# Patient Record
Sex: Female | Born: 1950
Health system: Southern US, Community
[De-identification: ages and names within clinical notes are randomized; demographics above are authoritative.]

## PROBLEM LIST (undated history)

## (undated) DIAGNOSIS — C50919 Malignant neoplasm of unspecified site of unspecified female breast: Secondary | ICD-10-CM

## (undated) DIAGNOSIS — F32A Depression, unspecified: Secondary | ICD-10-CM

## (undated) DIAGNOSIS — I10 Essential (primary) hypertension: Secondary | ICD-10-CM

## (undated) DIAGNOSIS — R001 Bradycardia, unspecified: Secondary | ICD-10-CM

## (undated) DIAGNOSIS — E785 Hyperlipidemia, unspecified: Secondary | ICD-10-CM

## (undated) DIAGNOSIS — J302 Other seasonal allergic rhinitis: Secondary | ICD-10-CM

## (undated) DIAGNOSIS — T8859XA Other complications of anesthesia, initial encounter: Secondary | ICD-10-CM

## (undated) DIAGNOSIS — M1990 Unspecified osteoarthritis, unspecified site: Secondary | ICD-10-CM

## (undated) DIAGNOSIS — R112 Nausea with vomiting, unspecified: Secondary | ICD-10-CM

## (undated) DIAGNOSIS — T4145XA Adverse effect of unspecified anesthetic, initial encounter: Secondary | ICD-10-CM

## (undated) DIAGNOSIS — R94130 Abnormal response to nerve stimulation, unspecified: Secondary | ICD-10-CM

## (undated) DIAGNOSIS — G4730 Sleep apnea, unspecified: Secondary | ICD-10-CM

## (undated) DIAGNOSIS — E039 Hypothyroidism, unspecified: Secondary | ICD-10-CM

## (undated) DIAGNOSIS — Z9889 Other specified postprocedural states: Secondary | ICD-10-CM

## (undated) DIAGNOSIS — F329 Major depressive disorder, single episode, unspecified: Secondary | ICD-10-CM

## (undated) DIAGNOSIS — E049 Nontoxic goiter, unspecified: Secondary | ICD-10-CM

## (undated) HISTORY — DX: Major depressive disorder, single episode, unspecified: F32.9

## (undated) HISTORY — DX: Hyperlipidemia, unspecified: E78.5

## (undated) HISTORY — DX: Unspecified osteoarthritis, unspecified site: M19.90

## (undated) HISTORY — DX: Hypothyroidism, unspecified: E03.9

## (undated) HISTORY — DX: Abnormal response to nerve stimulation, unspecified: R94.130

## (undated) HISTORY — DX: Morbid (severe) obesity due to excess calories: E66.01

## (undated) HISTORY — DX: Nontoxic goiter, unspecified: E04.9

## (undated) HISTORY — DX: Sleep apnea, unspecified: G47.30

## (undated) HISTORY — DX: Malignant neoplasm of unspecified site of unspecified female breast: C50.919

## (undated) HISTORY — DX: Essential (primary) hypertension: I10

## (undated) HISTORY — PX: APPENDECTOMY: SHX54

## (undated) HISTORY — DX: Depression, unspecified: F32.A

## (undated) HISTORY — PX: COLONOSCOPY: SHX174

---

## 1998-09-28 ENCOUNTER — Encounter: Payer: Self-pay | Admitting: *Deleted

## 1998-09-28 ENCOUNTER — Ambulatory Visit (HOSPITAL_COMMUNITY): Admission: RE | Admit: 1998-09-28 | Discharge: 1998-09-28 | Payer: Self-pay | Admitting: *Deleted

## 1998-10-01 ENCOUNTER — Other Ambulatory Visit: Admission: RE | Admit: 1998-10-01 | Discharge: 1998-10-01 | Payer: Self-pay | Admitting: *Deleted

## 1999-12-21 ENCOUNTER — Encounter: Payer: Self-pay | Admitting: *Deleted

## 1999-12-21 ENCOUNTER — Ambulatory Visit (HOSPITAL_COMMUNITY): Admission: RE | Admit: 1999-12-21 | Discharge: 1999-12-21 | Payer: Self-pay | Admitting: *Deleted

## 2001-03-26 ENCOUNTER — Ambulatory Visit (HOSPITAL_BASED_OUTPATIENT_CLINIC_OR_DEPARTMENT_OTHER): Admission: RE | Admit: 2001-03-26 | Discharge: 2001-03-26 | Payer: Self-pay | Admitting: Family Medicine

## 2001-03-26 ENCOUNTER — Encounter: Payer: Self-pay | Admitting: Pulmonary Disease

## 2001-08-02 ENCOUNTER — Ambulatory Visit (HOSPITAL_BASED_OUTPATIENT_CLINIC_OR_DEPARTMENT_OTHER): Admission: RE | Admit: 2001-08-02 | Discharge: 2001-08-02 | Payer: Self-pay | Admitting: Pulmonary Disease

## 2001-08-02 ENCOUNTER — Encounter: Payer: Self-pay | Admitting: Pulmonary Disease

## 2002-09-03 ENCOUNTER — Encounter: Admission: RE | Admit: 2002-09-03 | Discharge: 2002-12-02 | Payer: Self-pay | Admitting: Internal Medicine

## 2003-12-23 ENCOUNTER — Emergency Department (HOSPITAL_COMMUNITY): Admission: EM | Admit: 2003-12-23 | Discharge: 2003-12-23 | Payer: Self-pay | Admitting: Emergency Medicine

## 2004-07-13 ENCOUNTER — Ambulatory Visit (HOSPITAL_COMMUNITY): Admission: RE | Admit: 2004-07-13 | Discharge: 2004-07-13 | Payer: Self-pay | Admitting: Orthopedic Surgery

## 2004-07-20 ENCOUNTER — Ambulatory Visit: Payer: Self-pay | Admitting: Internal Medicine

## 2004-07-22 ENCOUNTER — Ambulatory Visit: Payer: Self-pay | Admitting: Internal Medicine

## 2004-09-02 ENCOUNTER — Ambulatory Visit (HOSPITAL_COMMUNITY): Admission: RE | Admit: 2004-09-02 | Discharge: 2004-09-02 | Payer: Self-pay | Admitting: Orthopedic Surgery

## 2004-11-01 ENCOUNTER — Ambulatory Visit: Payer: Self-pay | Admitting: Internal Medicine

## 2005-02-07 ENCOUNTER — Ambulatory Visit: Payer: Self-pay | Admitting: Internal Medicine

## 2005-02-27 HISTORY — PX: KNEE ARTHROSCOPY W/ MENISCECTOMY: SHX1879

## 2005-08-11 ENCOUNTER — Ambulatory Visit: Payer: Self-pay | Admitting: Internal Medicine

## 2006-03-08 ENCOUNTER — Ambulatory Visit: Payer: Self-pay | Admitting: Internal Medicine

## 2006-03-27 ENCOUNTER — Ambulatory Visit: Payer: Self-pay | Admitting: Internal Medicine

## 2006-06-12 ENCOUNTER — Ambulatory Visit: Payer: Self-pay | Admitting: Internal Medicine

## 2006-06-27 ENCOUNTER — Ambulatory Visit: Payer: Self-pay | Admitting: Internal Medicine

## 2006-06-27 LAB — CONVERTED CEMR LAB
BUN: 16 mg/dL (ref 6–23)
CO2: 26 meq/L (ref 19–32)
Calcium: 9.4 mg/dL (ref 8.4–10.5)
Chloride: 106 meq/L (ref 96–112)
Creatinine, Ser: 0.9 mg/dL (ref 0.4–1.2)

## 2006-12-13 ENCOUNTER — Encounter: Payer: Self-pay | Admitting: Internal Medicine

## 2006-12-24 ENCOUNTER — Ambulatory Visit: Payer: Self-pay | Admitting: Pulmonary Disease

## 2007-02-12 ENCOUNTER — Encounter: Payer: Self-pay | Admitting: Pulmonary Disease

## 2007-02-12 DIAGNOSIS — E039 Hypothyroidism, unspecified: Secondary | ICD-10-CM | POA: Insufficient documentation

## 2007-02-12 DIAGNOSIS — I10 Essential (primary) hypertension: Secondary | ICD-10-CM | POA: Insufficient documentation

## 2007-02-12 DIAGNOSIS — E7800 Pure hypercholesterolemia, unspecified: Secondary | ICD-10-CM | POA: Insufficient documentation

## 2007-02-12 DIAGNOSIS — G4730 Sleep apnea, unspecified: Secondary | ICD-10-CM | POA: Insufficient documentation

## 2007-02-14 ENCOUNTER — Telehealth (INDEPENDENT_AMBULATORY_CARE_PROVIDER_SITE_OTHER): Payer: Self-pay | Admitting: *Deleted

## 2007-04-02 ENCOUNTER — Telehealth (INDEPENDENT_AMBULATORY_CARE_PROVIDER_SITE_OTHER): Payer: Self-pay | Admitting: *Deleted

## 2007-04-24 ENCOUNTER — Ambulatory Visit: Payer: Self-pay | Admitting: Internal Medicine

## 2007-04-24 DIAGNOSIS — M1990 Unspecified osteoarthritis, unspecified site: Secondary | ICD-10-CM | POA: Insufficient documentation

## 2007-04-26 ENCOUNTER — Telehealth (INDEPENDENT_AMBULATORY_CARE_PROVIDER_SITE_OTHER): Payer: Self-pay | Admitting: *Deleted

## 2007-04-26 ENCOUNTER — Encounter (INDEPENDENT_AMBULATORY_CARE_PROVIDER_SITE_OTHER): Payer: Self-pay | Admitting: *Deleted

## 2007-08-05 ENCOUNTER — Inpatient Hospital Stay (HOSPITAL_COMMUNITY): Admission: RE | Admit: 2007-08-05 | Discharge: 2007-08-08 | Payer: Self-pay | Admitting: Orthopedic Surgery

## 2007-08-05 ENCOUNTER — Ambulatory Visit: Payer: Self-pay | Admitting: Cardiology

## 2007-08-05 HISTORY — PX: TOTAL KNEE ARTHROPLASTY: SHX125

## 2007-08-07 ENCOUNTER — Encounter: Payer: Self-pay | Admitting: Pulmonary Disease

## 2007-10-01 ENCOUNTER — Ambulatory Visit (HOSPITAL_COMMUNITY): Admission: RE | Admit: 2007-10-01 | Discharge: 2007-10-02 | Payer: Self-pay | Admitting: Orthopedic Surgery

## 2007-10-14 ENCOUNTER — Telehealth (INDEPENDENT_AMBULATORY_CARE_PROVIDER_SITE_OTHER): Payer: Self-pay | Admitting: *Deleted

## 2007-10-24 ENCOUNTER — Ambulatory Visit: Payer: Self-pay | Admitting: Internal Medicine

## 2007-10-24 DIAGNOSIS — F32A Depression, unspecified: Secondary | ICD-10-CM | POA: Insufficient documentation

## 2007-10-24 DIAGNOSIS — F329 Major depressive disorder, single episode, unspecified: Secondary | ICD-10-CM

## 2007-10-24 DIAGNOSIS — F419 Anxiety disorder, unspecified: Secondary | ICD-10-CM

## 2007-10-28 LAB — CONVERTED CEMR LAB: TSH: 3.16 microintl units/mL (ref 0.35–5.50)

## 2007-11-20 ENCOUNTER — Ambulatory Visit: Payer: Self-pay | Admitting: Pulmonary Disease

## 2007-11-25 ENCOUNTER — Ambulatory Visit: Payer: Self-pay | Admitting: Internal Medicine

## 2008-10-29 ENCOUNTER — Encounter (INDEPENDENT_AMBULATORY_CARE_PROVIDER_SITE_OTHER): Payer: Self-pay | Admitting: *Deleted

## 2008-11-19 ENCOUNTER — Ambulatory Visit: Payer: Self-pay | Admitting: Pulmonary Disease

## 2008-11-19 DIAGNOSIS — G4733 Obstructive sleep apnea (adult) (pediatric): Secondary | ICD-10-CM | POA: Insufficient documentation

## 2008-12-21 ENCOUNTER — Ambulatory Visit: Payer: Self-pay | Admitting: Internal Medicine

## 2008-12-21 LAB — CONVERTED CEMR LAB
Hgb A1c MFr Bld: 12.6 % — ABNORMAL HIGH (ref 4.6–6.5)
Vit D, 25-Hydroxy: 17 ng/mL — ABNORMAL LOW (ref 30–89)

## 2008-12-30 ENCOUNTER — Ambulatory Visit: Payer: Self-pay | Admitting: Internal Medicine

## 2008-12-31 LAB — CONVERTED CEMR LAB
ALT: 20 units/L (ref 0–35)
AST: 25 units/L (ref 0–37)
Basophils Absolute: 0.1 10*3/uL (ref 0.0–0.1)
Basophils Relative: 1.9 % (ref 0.0–3.0)
Calcium: 9.1 mg/dL (ref 8.4–10.5)
Direct LDL: 110.9 mg/dL
Eosinophils Relative: 2.5 % (ref 0.0–5.0)
GFR calc non Af Amer: 77.9 mL/min (ref >60)
Glucose, Bld: 188 mg/dL — ABNORMAL HIGH (ref 70–99)
HCT: 44.4 % (ref 36.0–46.0)
HDL: 42.8 mg/dL (ref >39.00)
Hemoglobin: 14.9 g/dL (ref 12.0–15.0)
Lymphocytes Relative: 26.9 % (ref 12.0–46.0)
Lymphs Abs: 1.8 10*3/uL (ref 0.7–4.0)
Monocytes Relative: 5.9 % (ref 3.0–12.0)
Neutro Abs: 4.1 10*3/uL (ref 1.4–7.7)
Potassium: 3.8 meq/L (ref 3.5–5.1)
RBC: 5.08 M/uL (ref 3.87–5.11)
RDW: 14.1 % (ref 11.5–14.6)
Sodium: 135 meq/L (ref 135–145)
Total CHOL/HDL Ratio: 4
VLDL: 41.2 mg/dL — ABNORMAL HIGH (ref 0.0–40.0)
WBC: 6.6 10*3/uL (ref 4.5–10.5)

## 2009-01-01 ENCOUNTER — Ambulatory Visit: Payer: Self-pay | Admitting: Internal Medicine

## 2009-01-01 DIAGNOSIS — E1169 Type 2 diabetes mellitus with other specified complication: Secondary | ICD-10-CM | POA: Insufficient documentation

## 2009-01-01 DIAGNOSIS — E559 Vitamin D deficiency, unspecified: Secondary | ICD-10-CM | POA: Insufficient documentation

## 2009-01-01 DIAGNOSIS — E669 Obesity, unspecified: Secondary | ICD-10-CM | POA: Insufficient documentation

## 2009-01-15 ENCOUNTER — Telehealth (INDEPENDENT_AMBULATORY_CARE_PROVIDER_SITE_OTHER): Payer: Self-pay | Admitting: *Deleted

## 2009-01-18 ENCOUNTER — Encounter: Admission: RE | Admit: 2009-01-18 | Discharge: 2009-01-18 | Payer: Self-pay | Admitting: Internal Medicine

## 2009-01-25 ENCOUNTER — Encounter: Payer: Self-pay | Admitting: Internal Medicine

## 2009-01-25 ENCOUNTER — Ambulatory Visit: Payer: Self-pay | Admitting: Internal Medicine

## 2009-01-27 ENCOUNTER — Encounter: Payer: Self-pay | Admitting: Internal Medicine

## 2009-01-28 ENCOUNTER — Telehealth (INDEPENDENT_AMBULATORY_CARE_PROVIDER_SITE_OTHER): Payer: Self-pay | Admitting: *Deleted

## 2009-01-29 ENCOUNTER — Encounter: Admission: RE | Admit: 2009-01-29 | Discharge: 2009-01-29 | Payer: Self-pay | Admitting: Internal Medicine

## 2009-02-04 ENCOUNTER — Telehealth: Payer: Self-pay | Admitting: Internal Medicine

## 2009-02-22 ENCOUNTER — Encounter (INDEPENDENT_AMBULATORY_CARE_PROVIDER_SITE_OTHER): Payer: Self-pay | Admitting: *Deleted

## 2009-02-24 ENCOUNTER — Telehealth (INDEPENDENT_AMBULATORY_CARE_PROVIDER_SITE_OTHER): Payer: Self-pay | Admitting: *Deleted

## 2009-03-16 ENCOUNTER — Ambulatory Visit: Payer: Self-pay | Admitting: Internal Medicine

## 2009-03-18 ENCOUNTER — Encounter (INDEPENDENT_AMBULATORY_CARE_PROVIDER_SITE_OTHER): Payer: Self-pay | Admitting: *Deleted

## 2009-03-18 LAB — CONVERTED CEMR LAB: ALT: 18 units/L (ref 0–35)

## 2009-04-07 ENCOUNTER — Telehealth: Payer: Self-pay | Admitting: Internal Medicine

## 2009-04-23 ENCOUNTER — Telehealth (INDEPENDENT_AMBULATORY_CARE_PROVIDER_SITE_OTHER): Payer: Self-pay | Admitting: *Deleted

## 2009-06-07 ENCOUNTER — Telehealth (INDEPENDENT_AMBULATORY_CARE_PROVIDER_SITE_OTHER): Payer: Self-pay | Admitting: *Deleted

## 2009-06-08 ENCOUNTER — Ambulatory Visit: Payer: Self-pay | Admitting: Internal Medicine

## 2009-06-10 LAB — CONVERTED CEMR LAB
CO2: 30 meq/L (ref 19–32)
Chloride: 104 meq/L (ref 96–112)
Creatinine, Ser: 0.9 mg/dL (ref 0.4–1.2)
Hgb A1c MFr Bld: 8.1 % — ABNORMAL HIGH (ref 4.6–6.5)
Potassium: 4.7 meq/L (ref 3.5–5.1)
Sodium: 143 meq/L (ref 135–145)
TSH: 2.15 microintl units/mL (ref 0.35–5.50)

## 2009-06-17 ENCOUNTER — Telehealth (INDEPENDENT_AMBULATORY_CARE_PROVIDER_SITE_OTHER): Payer: Self-pay | Admitting: *Deleted

## 2009-08-02 ENCOUNTER — Telehealth (INDEPENDENT_AMBULATORY_CARE_PROVIDER_SITE_OTHER): Payer: Self-pay | Admitting: *Deleted

## 2009-09-08 ENCOUNTER — Ambulatory Visit: Payer: Self-pay | Admitting: Internal Medicine

## 2009-09-10 LAB — CONVERTED CEMR LAB
ALT: 15 units/L (ref 0–35)
AST: 20 units/L (ref 0–37)
Hgb A1c MFr Bld: 6.4 % (ref 4.6–6.5)
TSH: 0.71 microintl units/mL (ref 0.35–5.50)

## 2009-11-19 ENCOUNTER — Ambulatory Visit: Payer: Self-pay | Admitting: Pulmonary Disease

## 2009-12-06 ENCOUNTER — Encounter: Payer: Self-pay | Admitting: Internal Medicine

## 2009-12-06 ENCOUNTER — Ambulatory Visit: Payer: Self-pay | Admitting: Internal Medicine

## 2009-12-06 LAB — CONVERTED CEMR LAB: Vit D, 25-Hydroxy: 44 ng/mL (ref 30–89)

## 2009-12-08 LAB — CONVERTED CEMR LAB
Calcium: 9.2 mg/dL (ref 8.4–10.5)
Cholesterol: 167 mg/dL (ref 0–200)
Creatinine, Ser: 1 mg/dL (ref 0.4–1.2)
GFR calc non Af Amer: 60.02 mL/min (ref >60)
Sodium: 139 meq/L (ref 135–145)
Triglycerides: 120 mg/dL (ref 0.0–149.0)

## 2009-12-25 ENCOUNTER — Encounter: Payer: Self-pay | Admitting: Pulmonary Disease

## 2010-02-01 ENCOUNTER — Telehealth: Payer: Self-pay | Admitting: Internal Medicine

## 2010-02-11 ENCOUNTER — Ambulatory Visit: Payer: Self-pay | Admitting: Internal Medicine

## 2010-02-17 ENCOUNTER — Telehealth: Payer: Self-pay | Admitting: Internal Medicine

## 2010-02-23 ENCOUNTER — Ambulatory Visit
Admission: RE | Admit: 2010-02-23 | Discharge: 2010-02-23 | Payer: Self-pay | Source: Home / Self Care | Attending: Licensed Clinical Social Worker | Admitting: Licensed Clinical Social Worker

## 2010-03-08 ENCOUNTER — Ambulatory Visit: Admit: 2010-03-08 | Payer: Self-pay | Admitting: Licensed Clinical Social Worker

## 2010-03-16 ENCOUNTER — Ambulatory Visit
Admission: RE | Admit: 2010-03-16 | Discharge: 2010-03-16 | Payer: Self-pay | Source: Home / Self Care | Attending: Internal Medicine | Admitting: Internal Medicine

## 2010-03-20 ENCOUNTER — Encounter: Payer: Self-pay | Admitting: Internal Medicine

## 2010-03-22 ENCOUNTER — Ambulatory Visit: Admit: 2010-03-22 | Payer: Self-pay | Admitting: Licensed Clinical Social Worker

## 2010-03-27 LAB — CONVERTED CEMR LAB
ALT: 18 units/L (ref 0–35)
Basophils Absolute: 0 10*3/uL (ref 0.0–0.1)
Bilirubin Urine: NEGATIVE
Blood in Urine, dipstick: NEGATIVE
Chloride: 109 meq/L (ref 96–112)
Cholesterol: 156 mg/dL (ref 0–200)
Eosinophils Absolute: 0.2 10*3/uL (ref 0.0–0.6)
GFR calc Af Amer: 83 mL/min
GFR calc non Af Amer: 69 mL/min
Glucose, Bld: 107 mg/dL — ABNORMAL HIGH (ref 70–99)
Glucose, Urine, Semiquant: NEGATIVE
HCT: 42.9 % (ref 36.0–46.0)
Ketones, urine, test strip: NEGATIVE
Lymphocytes Relative: 22.3 % (ref 12.0–46.0)
MCHC: 32.7 g/dL (ref 30.0–36.0)
MCV: 86.8 fL (ref 78.0–100.0)
Neutro Abs: 4.4 10*3/uL (ref 1.4–7.7)
Neutrophils Relative %: 65.2 % (ref 43.0–77.0)
Platelets: 277 10*3/uL (ref 150–400)
Protein, U semiquant: NEGATIVE
RBC: 4.94 M/uL (ref 3.87–5.11)
Sodium: 141 meq/L (ref 135–145)
Specific Gravity, Urine: 1.01
Total CHOL/HDL Ratio: 3.5
Triglycerides: 96 mg/dL (ref 0–149)
WBC, UA: NONE SEEN cells/hpf (ref <3)
pH: 7.5

## 2010-03-31 NOTE — Assessment & Plan Note (Signed)
Summary: rov for osa   Visit Type:  Follow-up  CC:  1 year follow up. pt states she uses cpap 7/7 nights x 6-8 hrs. Pt states she needs a new mask due to wear and tear. Pt states she is not having any problems with machine or pressure. Marland Kitchen  History of Present Illness: the pt comes in today for f/u of her known osa.  She has been wearing cpap compliantly, and denies any issues with her mask fit or pressure.  She is in need of a new mask and supplies.  She is sleeping well at night, and denies any issues with daytime alertness.  Her weight has gone up 27 pounds since the last visit.  Current Medications (verified): 1)  Crestor 10 Mg Tabs (Rosuvastatin Calcium) .Marland Kitchen.. 1 By Mouth Qd 2)  Metoprolol Tartrate 50 Mg Tabs (Metoprolol Tartrate) .... Take 1 Tablet By Mouth Two Times A Day 3)  Benazepril Hcl 20 Mg  Tabs (Benazepril Hcl) .... Take One Tablet Daily 4)  Hydrochlorothiazide 25 Mg  Tabs (Hydrochlorothiazide) .... Take One Tablet Daily 5)  Synthroid 150 Mcg Tabs (Levothyroxine Sodium) .Marland Kitchen.. 1 By Mouth Once Daily - Recheck Tsh in 2 Months 6)  Sertraline Hcl 50 Mg Tabs (Sertraline Hcl) .... Take 2  Tabs By Mouth Daily 7)  One Touch Ultra Test Strips .... Check Bs Daily;  Dx 250.00 8)  Glimepiride 4 Mg Tabs (Glimepiride) .Marland Kitchen.. 1 By Mouth Qam, 1/2 By Mouth With Dinner 9)  Metformin Hcl 500 Mg Tabs (Metformin Hcl) .Marland Kitchen.. 1 By Mouth Two Times A Day 10)  Actos 30 Mg Tabs (Pioglitazone Hcl) .Marland Kitchen.. 1 By Mouth Once Daily 11)  One Touch Ultra Lancets .... Use As Directed Daily. 12)  Onetouch Ultrasoft Lancets  Misc (Lancets) .... As Directed  Allergies (verified): No Known Drug Allergies  Review of Systems       The patient complains of shortness of breath with activity, non-productive cough, and hand/feet swelling.  The patient denies shortness of breath at rest, productive cough, coughing up blood, chest pain, irregular heartbeats, acid heartburn, indigestion, loss of appetite, weight change, abdominal  pain, difficulty swallowing, sore throat, tooth/dental problems, headaches, nasal congestion/difficulty breathing through nose, sneezing, itching, ear ache, anxiety, depression, joint stiffness or pain, rash, change in color of mucus, and fever.    Vital Signs:  Patient profile:   60 year old female Height:      64.5 inches Weight:      294.50 pounds BMI:     49.95 O2 Sat:      95 % on Room air Temp:     98.3 degrees F oral Pulse rate:   70 / minute BP sitting:   140 / 72  (left arm) Cuff size:   large  Vitals Entered By: Carver Fila (November 19, 2009 3:05 PM)  O2 Flow:  Room air CC: 1 year follow up. pt states she uses cpap 7/7 nights x 6-8 hrs. Pt states she needs a new mask due to wear and tear. Pt states she is not having any problems with machine or pressure.  Comments meds and allergies updated Phone number updated Carver Fila  November 19, 2009 3:06 PM    Physical Exam  General:  obese female in nad Nose:  no skin breakdown or pressure necrosis from cpap mask Extremities:  1+ edema bilat, no cyanosis  Neurologic:  alert and oriented, moves all 4. does not appear sleepy   Impression & Recommendations:  Problem #  1:  OBSTRUCTIVE SLEEP APNEA (ICD-327.23) the pt is wearing cpap compliantly, and feels that it is continuing to help her.  She is in need of new supplies and mask, and with her weight increase of 27 pounds, will need to have her pressure re-optimized.  Will set up on auto mode for 2 weeks, and will get download and call her with the results.  I have urged her to work aggressively on weight loss.  If she is doing well after pressure adjustment, will see in followup in one year.  Other Orders: Est. Patient Level III (28413) DME Referral (DME)  Patient Instructions: 1)  will get you new mask and supplies 2)  will have your pressure re-optimized with auto device for 2 weeks.Marland KitchenMarland KitchenI will call you when download is available. 3)  work on weight loss 4)  followup  with me in one year.    Appended Document: rov for osa we have received pt cpap download and put into your very important look at folder

## 2010-03-31 NOTE — Assessment & Plan Note (Signed)
Summary: follow up/cbs   Vital Signs:  Patient profile:   60 year old female Weight:      292 pounds Pulse rate:   60 / minute Pulse rhythm:   regular BP sitting:   136 / 80  (left arm) Cuff size:   large  Vitals Entered By: Army Fossa CMA (February 11, 2010 2:45 PM) CC: follow up visit. Comments discuss referral for counseling  meds helped some  [facility] [address]    History of Present Illness: patient lost her only daughter a few days ago. Obviously upset about the situation  Current Medications (verified): 1)  Crestor 10 Mg Tabs (Rosuvastatin Calcium) .Marland Kitchen.. 1 By Mouth Qd 2)  Metoprolol Tartrate 50 Mg Tabs (Metoprolol Tartrate) .... Take 1 Tablet By Mouth Two Times A Day 3)  Benazepril Hcl 20 Mg  Tabs (Benazepril Hcl) .... Take One Tablet Daily 4)  Hydrochlorothiazide 25 Mg  Tabs (Hydrochlorothiazide) .... Take One Tablet Daily 5)  Synthroid 150 Mcg Tabs (Levothyroxine Sodium) .Marland Kitchen.. 1 By Mouth Once Daily - Recheck Tsh in 2 Months 6)  Sertraline Hcl 50 Mg Tabs (Sertraline Hcl) .... Take 2  Tabs By Mouth Daily 7)  Glimepiride 4 Mg Tabs (Glimepiride) .Marland Kitchen.. 1 By Mouth Qam, 1/2 By Mouth With Dinner 8)  Metformin Hcl 500 Mg Tabs (Metformin Hcl) .Marland Kitchen.. 1 By Mouth Two Times A Day 9)  Actos 30 Mg Tabs (Pioglitazone Hcl) .Marland Kitchen.. 1 By Mouth Once Daily 10)  Ca and Vit D 11)  One Touch Ultra Test Strips .... Check Bs Daily;  Dx 250.00 12)  One Touch Ultra Lancets .... Use As Directed Daily. 13)  Onetouch Ultrasoft Lancets  Misc (Lancets) .... As Directed 14)  Aspirin 81 Mg Tbec (Aspirin) .Marland Kitchen.. 1 A Day 15)  Alprazolam 0.5 Mg Tabs (Alprazolam) .... One P.o. T.i.d. P.r.n. Severe Anxiety or Difficulty Sleeping.  Allergies (verified): No Known Drug Allergies  Past History:  Past Medical History: Reviewed history from 09/08/2009 and no changes required. DM HYPOTHYROIDISM w/ h/o goiter MORBID OBESITY   SLEEP APNEA, on CPAP DYSLIPIDEMIA  HYPERTENSION   OSTEOARTHRITIS Depression  Past  Surgical History: Reviewed history from 10/24/2007 and no changes required. Appendectomy Multiple orthpedic procedures R TKR  08-05-07  Review of Systems       no suicidal thoughts Has used Xanax as needed with good results  Physical Exam  General:  alert and well-developed.   Psych:  tearful but coherent and cooperative    Impression & Recommendations:  Problem # 1:  DEPRESSION (ICD-311)  acute worsening of her symptoms due to the loss of her daughter I counseled her the best i could She is not suicidal at this point Will increase sertraline from 100 mg to 150 mg, encouraged to use the Xanax as needed Reassess in 6 weeks Referred to a counselor as soon as possible Her updated medication list for this problem includes:    Sertraline Hcl 50 Mg Tabs (Sertraline hcl) .Marland Kitchen... Take 3  tabs by mouth daily    Alprazolam 0.5 Mg Tabs (Alprazolam) ..... One p.o. t.i.d. p.r.n. severe anxiety or difficulty sleeping.  Orders: Misc. Referral (Misc. Ref)  Complete Medication List: 1)  Crestor 10 Mg Tabs (Rosuvastatin calcium) .Marland Kitchen.. 1 by mouth qd 2)  Metoprolol Tartrate 50 Mg Tabs (Metoprolol tartrate) .... Take 1 tablet by mouth two times a day 3)  Benazepril Hcl 20 Mg Tabs (Benazepril hcl) .... Take one tablet daily 4)  Hydrochlorothiazide 25 Mg Tabs (Hydrochlorothiazide) .... Take one  tablet daily 5)  Synthroid 150 Mcg Tabs (Levothyroxine sodium) .Marland Kitchen.. 1 by mouth once daily - recheck tsh in 2 months 6)  Sertraline Hcl 50 Mg Tabs (Sertraline hcl) .... Take 3  tabs by mouth daily 7)  Glimepiride 4 Mg Tabs (Glimepiride) .Marland Kitchen.. 1 by mouth qam, 1/2 by mouth with dinner 8)  Metformin Hcl 500 Mg Tabs (Metformin hcl) .Marland Kitchen.. 1 by mouth two times a day 9)  Actos 30 Mg Tabs (Pioglitazone hcl) .Marland Kitchen.. 1 by mouth once daily 10)  Ca and Vit D  11)  One Touch Ultra Test Strips  .... Check bs daily;  dx 250.00 12)  One Touch Ultra Lancets  .... Use as directed daily. 13)  Onetouch Ultrasoft Lancets Misc  (Lancets) .... As directed 14)  Aspirin 81 Mg Tbec (Aspirin) .Marland Kitchen.. 1 a day 15)  Alprazolam 0.5 Mg Tabs (Alprazolam) .... One p.o. t.i.d. p.r.n. severe anxiety or difficulty sleeping.  Patient Instructions: 1)  Please schedule a follow-up appointment in 6  weeks.  Prescriptions: ALPRAZOLAM 0.5 MG TABS (ALPRAZOLAM) one p.o. t.i.d. p.r.n. severe anxiety or difficulty sleeping.  #60 x 0   Entered and Authorized by:   Nolon Rod. Paz MD   Signed by:   Nolon Rod. Paz MD on 02/11/2010   Method used:   Print then Give to Patient   RxID:   [id number] SERTRALINE HCL 50 MG TABS (SERTRALINE HCL) take 3  tabs by mouth daily  #90 x 3   Entered and Authorized by:   Nolon Rod. Paz MD   Signed by:   Nolon Rod. Paz MD on 02/11/2010   Method used:   Print then Give to Patient   RxID:   [id number]    Orders Added: 1)  Misc. Referral [Misc. Ref] 2)  Est. Patient Level III [84696]

## 2010-03-31 NOTE — Assessment & Plan Note (Signed)
Summary: 3 MTH FU/KDC   Vital Signs:  Patient profile:   60 year old female Weight:      277.50 pounds Pulse rate:   63 / minute Pulse rhythm:   regular BP sitting:   126 / 84  (left arm) Cuff size:   large  Vitals Entered By: Army Fossa CMA (September 08, 2009 2:12 PM) CC: 3 month follow up Comments - Still emotionally dealing with losing her brother in may.    History of Present Illness: lost  his brother to a massive MI in May 2011, they were very close. obviously  is very affected by this, her mood has up and downs not sleeping very well lately  needs all her prescriptions redone  Allergies (verified): No Known Drug Allergies  Past History:  Past Medical History: DM HYPOTHYROIDISM w/ h/o goiter MORBID OBESITY   SLEEP APNEA, on CPAP DYSLIPIDEMIA  HYPERTENSION   OSTEOARTHRITIS Depression  Past Surgical History: Reviewed history from 10/24/2007 and no changes required. Appendectomy Multiple orthpedic procedures R TKR  08-05-07  Social History: Reviewed history from 12/21/2008 and no changes required. Married, husband on disability since 85 (depression) 1 child Occupation: Data processing manager Former Smoker Alcohol use-yes (occasionally) Drug use-no Regular exercise-yes (couple times a week)  Review of Systems       started actos, fluid retention at baseline ambulatory CBGs good , 110 to 120 in AM, 75 to 90 at PM  good medication compliance w/ BP meds , ambulatory BPs wnl     Psych:  denies suicidal ideation occ.  crying spells.  Physical Exam  General:  alert, well-developed, and overweight-appearing.   Lungs:  normal respiratory effort, no intercostal retractions, no accessory muscle use, and normal breath sounds.   Heart:  normal rate, regular rhythm, no murmur, and no gallop.   Extremities:  trace edema B Psych:  Oriented X3, normally interactive, good eye contact, and not anxious appearing.  appears slightly depressed   Impression &  Recommendations:  Problem # 1:  DEPRESSION (ICD-311) counseled  Just lost her brother We agreed to increase sertraline to 100 mg daily At this point declines counseling mostly due to lack of time. She will keep me posted Her updated medication list for this problem includes:    Sertraline Hcl 50 Mg Tabs (Sertraline hcl) .Marland Kitchen... Take 2  tabs by mouth daily  Problem # 2:  DM (ICD-250.00)  we recently added Actos, labs   Her updated medication list for this problem includes:    Benazepril Hcl 20 Mg Tabs (Benazepril hcl) .Marland Kitchen... Take one tablet daily    Glimepiride 4 Mg Tabs (Glimepiride) .Marland Kitchen... 1 by mouth qam, 1/2 by mouth with dinner    Metformin Hcl 500 Mg Tabs (Metformin hcl) .Marland Kitchen... 1 by mouth two times a day    Actos 30 Mg Tabs (Pioglitazone hcl) .Marland Kitchen... 1 by mouth once daily  Labs Reviewed: Creat: 0.9 (06/08/2009)    Reviewed HgBA1c results: 8.1 (06/08/2009)  7.6 (03/16/2009)  Orders: TLB-A1C / Hgb A1C (Glycohemoglobin) (83036-A1C) TLB-ALT (SGPT) (84460-ALT) TLB-AST (SGOT) (84450-SGOT)  Problem # 3:  HYPOTHYROIDISM (ICD-244.9)  labs Her updated medication list for this problem includes:    Synthroid 150 Mcg Tabs (Levothyroxine sodium) .Marland Kitchen... 1 by mouth once daily - recheck tsh in 2 months    Labs Reviewed: TSH: 2.15 (06/08/2009)    HgBA1c: 8.1 (06/08/2009) Chol: 175 (12/21/2008)   HDL: 42.80 (12/21/2008)   LDL: 92 (04/24/2007)   TG: 206.0 (12/21/2008)  Orders: Venipuncture (02725) TLB-TSH (Thyroid  Stimulating Hormone) (84443-TSH)  Problem # 4:  HYPERTENSION (ICD-401.9) at goal  Her updated medication list for this problem includes:    Metoprolol Tartrate 50 Mg Tabs (Metoprolol tartrate) .Marland Kitchen... Take 1 tablet by mouth two times a day    Benazepril Hcl 20 Mg Tabs (Benazepril hcl) .Marland Kitchen... Take one tablet daily    Hydrochlorothiazide 25 Mg Tabs (Hydrochlorothiazide) .Marland Kitchen... Take one tablet daily  BP today: 126/84 Prior BP: 112/64 (06/08/2009)  Labs Reviewed: K+: 4.7  (06/08/2009) Creat: : 0.9 (06/08/2009)   Chol: 175 (12/21/2008)   HDL: 42.80 (12/21/2008)   LDL: 92 (04/24/2007)   TG: 206.0 (12/21/2008)  Complete Medication List: 1)  Crestor 10 Mg Tabs (Rosuvastatin calcium) .Marland Kitchen.. 1 by mouth qd 2)  Metoprolol Tartrate 50 Mg Tabs (Metoprolol tartrate) .... Take 1 tablet by mouth two times a day 3)  Benazepril Hcl 20 Mg Tabs (Benazepril hcl) .... Take one tablet daily 4)  Hydrochlorothiazide 25 Mg Tabs (Hydrochlorothiazide) .... Take one tablet daily 5)  Synthroid 150 Mcg Tabs (Levothyroxine sodium) .Marland Kitchen.. 1 by mouth once daily - recheck tsh in 2 months 6)  Sertraline Hcl 50 Mg Tabs (Sertraline hcl) .... Take 2  tabs by mouth daily 7)  One Touch Ultra Test Strips  .... Check bs daily;  dx 250.00 8)  Glimepiride 4 Mg Tabs (Glimepiride) .Marland Kitchen.. 1 by mouth qam, 1/2 by mouth with dinner 9)  Metformin Hcl 500 Mg Tabs (Metformin hcl) .Marland Kitchen.. 1 by mouth two times a day 10)  Actos 30 Mg Tabs (Pioglitazone hcl) .Marland Kitchen.. 1 by mouth once daily 11)  One Touch Ultra Lancets  .... Use as directed daily. 12)  Onetouch Ultrasoft Lancets Misc (Lancets) .... As directed  Patient Instructions: 1)  Please schedule a follow-up appointment in 3 to 4 months , fasting for a physical exam Prescriptions: ACTOS 30 MG TABS (PIOGLITAZONE HCL) 1 by mouth once daily  #90 x 3   Entered and Authorized by:   Elita Quick E. Gladys Gutman MD   Signed by:   Nolon Rod. Dionicio Shelnutt MD on 09/08/2009   Method used:   [facility]        [facility]  [address] [id number]       [address]       [place], [place]  [address]       Ph: [phone] or [phone]       Fax: [phone]   RxID:   [id number] METFORMIN HCL 500 MG TABS (METFORMIN HCL) 1 by mouth two times a day  #180 x 3   Entered and Authorized by:   Nolon Rod. Lewellyn Fultz MD   Signed by:   Nolon Rod. Lashun Mccants MD on 09/08/2009   Method used:   [facility]        [facility]  [address] [id number]       [address]       [place], [place]  [address]       Ph: [phone] or  [phone]       Fax: [phone]   RxID:   [id number] GLIMEPIRIDE 4 MG TABS (GLIMEPIRIDE) 1 by mouth qam, 1/2 by mouth with dinner  #135 x 3   Entered and Authorized by:   Nolon Rod. Dustee Bottenfield MD   Signed by:   Nolon Rod. Daniyah Fohl MD on 09/08/2009   Method used:   [facility]        [facility]  [address] [id number]       [address]  [place], [place]  [address]       Ph: [phone] or [phone]       Fax: [phone]   RxID:   [id number] SERTRALINE HCL 50 MG TABS (SERTRALINE HCL) take 2  tabs by mouth daily  #180 x 3   Entered and Authorized by:   Elita Quick E. Lucerito Rosinski MD   Signed by:   Nolon Rod. Aadhya Bustamante MD on 09/08/2009   Method used:   [facility]ron[facility]        [facility]  [address] [id number]       [address]       [place], [place]  [address]       Ph: [phone] or [phone]       Fax: [phone]   RxID:   [id number] SYNTHROID 150 MCG TABS (LEVOTHYROXINE SODIUM) 1 by mouth once daily - RECHECK TSH IN 2 MONTHS  #90 x 3   Entered and Authorized by:   Nolon Rod. Kartier Bennison MD   Signed by:   Nolon Rod. Rossie Bretado MD on 09/08/2009   Method used:   [facility]ron[facility]        [facility]  [address] [id number]       [address]       [place], [place]  [address]       Ph: [phone] or [phone]       Fax: [phone]   RxID:   [id number] HYDROCHLOROTHIAZIDE 25 MG  TABS (HYDROCHLOROTHIAZIDE) take one tablet daily  #90 x 3   Entered and Authorized by:   Nolon Rod. Caoilainn Sacks MD   Signed by:   Nolon Rod. Annalynne Ibanez MD on 09/08/2009   Method used:   [facility]ron[facility]        [facility]  [address] [id number]       [address]       [place], [place]  [address]       Ph: [phone] or [phone]       Fax: [phone]   RxID:   [id number] BENAZEPRIL HCL 20 MG  TABS (BENAZEPRIL HCL) take one tablet daily  #90 x 3   Entered and Authorized by:   Nolon Rod. Ariyon Gerstenberger MD   Signed by:   Nolon Rod. Reg Bircher MD on 09/08/2009   Method used:   [facility]ronically to        [facility]  [address] [id number]       [address]        [place], [place]  [address]       Ph: [phone] or [phone]       Fax: [phone]   RxID:   [id number] METOPROLOL TARTRATE 50 MG TABS (METOPROLOL TARTRATE) Take 1 tablet by mouth two times a day  #180 x 3   Entered and Authorized by:   Nolon Rod. Dezirea Mccollister MD   Signed by:   Nolon Rod. Jowanda Heeg MD on 09/08/2009   Method used:   [facility]ronically to        [facility]  [address] [id number]       [address]       [place], [place]  [address]       Ph: [phone] or [phone]       Fax: [phone]   RxID:   [id number] CRESTOR 10 MG TABS (ROSUVASTATIN CALCIUM) 1 by mouth qd  #90 x 3   Entered and Authorized by:   Nolon Rod. Duke Weisensel MD   Signed by:   Nolon Rod. Saidee Geremia MD on  09/08/2009   Method used:   Electronically to        [facility]  [address] [id number]       [address]       [place], [place]  [address]       Ph: [phone] or [phone]       Fax: [phone]   RxID:   [id number]

## 2010-03-31 NOTE — Progress Notes (Signed)
Summary: Refill Request  Phone Note Refill Request Call back at [phone] Message from:  Pharmacy on August 02, 2009 9:10 AM  Refills Requested: Medication #1:  ACTOS 30 MG TABS 1 by mouth once daily.   Dosage confirmed as above?Dosage Confirmed   [facility] [address] [facility] [place]  Next Appointment Scheduled: 7.13.11 Initial call taken by: Harold Barban,  August 02, 2009 9:10 AM    Prescriptions: ACTOS 30 MG TABS (PIOGLITAZONE HCL) 1 by mouth once daily  #30 x 3   Entered by:   Kandice Hams   Authorized by:   Nolon Rod. Paz MD   Signed by:   Kandice Hams on 08/02/2009   Method used:   F[facility]ed to ...       [facility]  [address] [id number]       [address]       [place], [place]  [address]       Ph: [phone] or [phone]       Fax: [phone]   RxID:   [id number]

## 2010-03-31 NOTE — Progress Notes (Signed)
Summary: blood sugar readings  Phone Note Call from Patient Call back at [phone]   Summary of Call: FASTING BLOOD SUGAR: 153, 138, 179, 171, 178, 160, 143 BLOOD SUGAR BEFORE DINNER 101, 80, 97, 90, 109, 102, 92, 81,86,108, 103  Current Meds:  CRESTOR 10 MG TABS (ROSUVASTATIN CALCIUM) 1 by mouth qd METOPROLOL TARTRATE 50 MG TABS (METOPROLOL TARTRATE) Take 1 tablet by mouth two times a day BENAZEPRIL HCL 20 MG  TABS (BENAZEPRIL HCL) take one tablet daily HYDROCHLOROTHIAZIDE 25 MG  TABS (HYDROCHLOROTHIAZIDE) take one tablet daily SYNTHROID 150 MCG TABS (LEVOTHYROXINE SODIUM) 1 by mouth once daily - RECHECK TSH IN 2 MONTHS SERTRALINE HCL 50 MG TABS (SERTRALINE HCL) take 1 1/2 tabs by mouth daily * ONE TOUCH ULTRA TEST STRIPS & LANCETS check BS daily;  dx 250.00 GLIMEPIRIDE 4 MG TABS (GLIMEPIRIDE) 1 a day METFORMIN HCL 500 MG TABS (METFORMIN HCL) 1 by mouth two times a day   Initial call taken by: Shary Decamp,  April 07, 2009 11:24 AM  Follow-up for Phone Call        increase GLIMEPIRIDE 4 mg from 1 once daily to 1 in AM , 1/2 w/ dinner call w/  CBGs in 3 weeks  call sooner if low CBGs Follow-up by: Zadiel Leyh E. Charlise Giovanetti MD,  April 07, 2009 12:52 PM  Additional Follow-up for Phone Call Additional follow up Details #1::        discussed with pt Additional Follow-up by: Shary Decamp,  April 07, 2009 1:43 PM    New/Updated Medications: GLIMEPIRIDE 4 MG TABS (GLIMEPIRIDE) 1 by mouth qam, 1/2 by mouth with dinner

## 2010-03-31 NOTE — Assessment & Plan Note (Signed)
Summary: 6 WKS OV//PH   Vital Signs:  Patient profile:   60 year old female Weight:      291.50 pounds Pulse rate:   76 / minute Pulse rhythm:   regular BP sitting:   124 / 82  (left arm) Cuff size:   large  Vitals Entered By: Army Fossa CMA (March 16, 2010 3:38 PM) CC: 6 week f/u- not fasting  Comments [facility] [address]   History of Present Illness: followup from his last office visit  Current Medications (verified): 1)  Crestor 10 Mg Tabs (Rosuvastatin Calcium) .Marland Kitchen.. 1 By Mouth Qd 2)  Metoprolol Tartrate 50 Mg Tabs (Metoprolol Tartrate) .... Take 1 Tablet By Mouth Two Times A Day 3)  Benazepril Hcl 20 Mg  Tabs (Benazepril Hcl) .... Take One Tablet Daily 4)  Hydrochlorothiazide 25 Mg  Tabs (Hydrochlorothiazide) .... Take One Tablet Daily 5)  Synthroid 150 Mcg Tabs (Levothyroxine Sodium) .Marland Kitchen.. 1 By Mouth Once Daily - Recheck Tsh in 2 Months 6)  Sertraline Hcl 50 Mg Tabs (Sertraline Hcl) .... Take 3  Tabs By Mouth Daily 7)  Glimepiride 4 Mg Tabs (Glimepiride) .Marland Kitchen.. 1 By Mouth Qam, 1/2 By Mouth With Dinner 8)  Metformin Hcl 500 Mg Tabs (Metformin Hcl) .Marland Kitchen.. 1 By Mouth Two Times A Day 9)  Actos 30 Mg Tabs (Pioglitazone Hcl) .Marland Kitchen.. 1 By Mouth Once Daily 10)  Ca and Vit D 11)  One Touch Ultra Test Strips .... Check Bs Daily;  Dx 250.00 12)  One Touch Ultra Lancets .... Use As Directed Daily. 13)  Onetouch Ultrasoft Lancets  Misc (Lancets) .... As Directed 14)  Aspirin 81 Mg Tbec (Aspirin) .Marland Kitchen.. 1 A Day 15)  Alprazolam 0.5 Mg Tabs (Alprazolam) .... One P.o. T.i.d. P.r.n. Severe Anxiety or Difficulty Sleeping.  Allergies (verified): No Known Drug Allergies  Past History:  Past Medical History: Reviewed history from 09/08/2009 and no changes required. DM HYPOTHYROIDISM w/ h/o goiter MORBID OBESITY   SLEEP APNEA, on CPAP DYSLIPIDEMIA  HYPERTENSION   OSTEOARTHRITIS Depression  Past Surgical History: Reviewed history from 10/24/2007 and no changes  required. Appendectomy Multiple orthpedic procedures R TKR  08-05-07  Social History: Married, husband on disability since 1991 (depression, dementia?) 1 child------ lost child 01-2010 Occupation: logistics Former Smoker Alcohol use-yes (occasionally) Drug use-no    Review of Systems       overall she seems to be doing fair Sleeping better Tolerating higher dose of sertraline Overall less depressed She see a Veterinary surgeon that she likes, Judithe Modest, she also has a good social support with friends and neighbors No suicidal ideas  Physical Exam  General:  alert and well-developed.   Psych:  Oriented X3.  since improved, much better spirits   Impression & Recommendations:  Problem # 1:  DEPRESSION (ICD-311) improving Continue same meds Continue seeing a counselor  Her updated medication list for this problem includes:    Sertraline Hcl 50 Mg Tabs (Sertraline hcl) .Marland Kitchen... Take 3  tabs by mouth daily    Alprazolam 0.5 Mg Tabs (Alprazolam) ..... One p.o. t.i.d. p.r.n. severe anxiety or difficulty sleeping.  Problem # 2:  face-to-face 15 minutes, >50% of time counseling  Complete Medication List: 1)  Crestor 10 Mg Tabs (Rosuvastatin calcium) .Marland Kitchen.. 1 by mouth qd 2)  Metoprolol Tartrate 50 Mg Tabs (Metoprolol tartrate) .... Take 1 tablet by mouth two times a day 3)  Benazepril Hcl 20 Mg Tabs (Benazepril hcl) .... Take one tablet daily 4)  Hydrochlorothiazide 25 Mg  Tabs (Hydrochlorothiazide) .... Take one tablet daily 5)  Synthroid 150 Mcg Tabs (Levothyroxine sodium) .Marland Kitchen.. 1 by mouth once daily - recheck tsh in 2 months 6)  Sertraline Hcl 50 Mg Tabs (Sertraline hcl) .... Take 3  tabs by mouth daily 7)  Glimepiride 4 Mg Tabs (Glimepiride) .Marland Kitchen.. 1 by mouth qam, 1/2 by mouth with dinner 8)  Metformin Hcl 500 Mg Tabs (Metformin hcl) .Marland Kitchen.. 1 by mouth two times a day 9)  Actos 30 Mg Tabs (Pioglitazone hcl) .Marland Kitchen.. 1 by mouth once daily 10)  Ca and Vit D  11)  One Touch Ultra Test Strips  ....  Check bs daily;  dx 250.00 12)  One Touch Ultra Lancets  .... Use as directed daily. 13)  Onetouch Ultrasoft Lancets Misc (Lancets) .... As directed 14)  Aspirin 81 Mg Tbec (Aspirin) .Marland Kitchen.. 1 a day 15)  Alprazolam 0.5 Mg Tabs (Alprazolam) .... One p.o. t.i.d. p.r.n. severe anxiety or difficulty sleeping.  Patient Instructions: 1)  Please schedule a follow-up appointment in 3 months .    Orders Added: 1)  Est. Patient Level III [16109]

## 2010-03-31 NOTE — Progress Notes (Signed)
Summary: blood sugar reading  Phone Note Call from Patient Call back at Home Phone [phone]   Caller: Patient Summary of Call: BLOOD SUGAR READINGS;  fasting BS this am 279, after breakfast 145 fasting BS yesterday 242, last night =90  Initial call taken by: Kandice Hams,  April 23, 2009 9:28 AM  Follow-up for Phone Call        no change for now, call in two weeks with sugar     Follow-up by: [facility] E. Paz MD,  April 23, 2009 1:49 PM  Additional Follow-up for Phone Call Additional follow up Details #1::        pt informed of Dr Drue Novel recommendations ref BS .Kandice Hams  April 23, 2009 2:53 PM  Additional Follow-up by: Kandice Hams,  April 23, 2009 2:53 PM

## 2010-03-31 NOTE — Progress Notes (Signed)
Summary: MISC REFERRAL  I spoke with the pt and she does not have an appt yet. She would like to be called at [phone]. She is aware I will notify [place].  ---- Converted from flag ---- ---- 02/17/2010 3:25 PM, Army Fossa CMA wrote: left message for pt to call back.  ---- 02/12/2010 11:41 AM, Elita Quick E. Kathren Scearce MD wrote: please be sure she saw a counselor ------------------------------  Phone Note Other Incoming

## 2010-03-31 NOTE — Miscellaneous (Signed)
Summary: optimal pressure 13cm.  Clinical Lists Changes  Orders: Added new Referral order of DME Referral (DME) - Signed auto download shows great compliance, no sigificant leak, optimal pressure of 13cm

## 2010-03-31 NOTE — Assessment & Plan Note (Signed)
Summary: roa 6 months,cbs   Vital Signs:  Patient Profile:   60 Years Old Female Height:     64 inches Weight:      270 pounds BSA:     2.22 Pulse rate:   60 / minute BP sitting:   120 / 70  Vitals Entered By: Shary Decamp (October 24, 2007 9:07 AM)                 Chief Complaint:  rov - fasting; problems with depression since surgery (see note from ortho).  History of Present Illness: rov - fasting;  recheck TSH depression: frustrated after TKR b/c recovery is slow and painful "waves of sadness"     Updated Prior Medication List: CRESTOR 10 MG TABS (ROSUVASTATIN CALCIUM) 1 by mouth qd METOPROLOL TARTRATE 50 MG TABS (METOPROLOL TARTRATE) bid BENAZEPRIL HCL 20 MG  TABS (BENAZEPRIL HCL) take one tablet daily HYDROCHLOROTHIAZIDE 25 MG  TABS (HYDROCHLOROTHIAZIDE) take one tablet daily SYNTHROID 125 MCG  TABS (LEVOTHYROXINE SODIUM) 1 by mouth qd HYDROCODONE-ACETAMINOPHEN 5-325 MG TABS (HYDROCODONE-ACETAMINOPHEN) per ortho METHOCARBAMOL 500 MG TABS (METHOCARBAMOL) per ortho  Current Allergies (reviewed today): No known allergies   Past Medical History:    Reviewed history from 04/24/2007 and no changes required:       HYPOTHYROIDISM w/ h/o goiter       MORBID OBESITY (ICD-278.01)       SLEEP APNEA, on CPAP       DYSLIPIDEMIA (ICD-272.4)       HYPERTENSION (ICD-401.9)       OSTEOARTHRITIS       Depression  Past Surgical History:    Appendectomy    Multiple orthpedic procedures    R TKR  08-05-07   Social History:    Married, husband on disability since 31 (depression)    1 child    Occupation: logistics    Review of Systems       no suicidal poor sleep h/o depression in the 1980s and 90s tried meds (name?) things at home are ok   Physical Exam  General:     alert and well-developed.   Psych:     Oriented X3, memory intact for recent and remote, normally interactive, good eye contact, and not anxious appearing.  looks mildly  depress    Impression & Recommendations:  Problem # 1:  DEPRESSION (ICD-311) counseled d/w pt options : meds, counseling or both I think for now will start w/ meds suicidal risk discussed  Her updated medication list for this problem includes:    Sertraline Hcl 50 Mg Tabs (Sertraline hcl) .Marland Kitchen... 1/2 a day x 5 days, 1 a day x 5 days, then 1.5 a day   Problem # 2:  HYPOTHYROIDISM (ICD-244.9)  Her updated medication list for this problem includes:    Synthroid 125 Mcg Tabs (Levothyroxine sodium) .Marland Kitchen... 1 by mouth qd  Orders: Venipuncture (16109) TLB-TSH (Thyroid Stimulating Hormone) (84443-TSH)   Complete Medication List: 1)  Crestor 10 Mg Tabs (Rosuvastatin calcium) .Marland Kitchen.. 1 by mouth qd 2)  Metoprolol Tartrate 50 Mg Tabs (Metoprolol tartrate) .... Bid 3)  Benazepril Hcl 20 Mg Tabs (Benazepril hcl) .... Take one tablet daily 4)  Hydrochlorothiazide 25 Mg Tabs (Hydrochlorothiazide) .... Take one tablet daily 5)  Synthroid 125 Mcg Tabs (Levothyroxine sodium) .Marland Kitchen.. 1 by mouth qd 6)  Hydrocodone-acetaminophen 5-325 Mg Tabs (Hydrocodone-acetaminophen) .... Per ortho 7)  Methocarbamol 500 Mg Tabs (Methocarbamol) .... Per ortho 8)  Sertraline Hcl 50 Mg Tabs (Sertraline hcl) .... 1/2  a day x 5 days, 1 a day x 5 days, then 1.5 a day   Patient Instructions: 1)  Please schedule a follow-up appointment in 4 weeks.   Prescriptions: SERTRALINE HCL 50 MG TABS (SERTRALINE HCL) 1/2 a day x 5 days, 1 a day x 5 days, then 1.5 a day  #60 x 0   Entered and Authorized by:   Elita Quick E. Jai Bear MD   Signed by:   Nolon Rod. Kanye Depree MD on 10/24/2007   Method used:   Print then Give to Patient   RxID:   [id number]  ]

## 2010-03-31 NOTE — Assessment & Plan Note (Signed)
Summary: cpx/fasting//kn   Vital Signs:  Patient profile:   60 year old female Height:      64.5 inches Weight:      287.13 pounds O2 Sat:      94 % on Room air Pulse rate:   71 / minute Pulse rhythm:   regular BP sitting:   126 / 84  (left arm) Cuff size:   large  Vitals Entered By: Army Fossa CMA (December 06, 2009 9:13 AM)  O2 Flow:  Room air CC: CPX, fasting Comments flu shot [facility] due for colonoscopy, pap, and flu shot   History of Present Illness: CPX, fasting    Preventive Screening-Counseling & Management  Caffeine-Diet-Exercise     Does Patient Exercise: no  Current Medications (verified): 1)  Crestor 10 Mg Tabs (Rosuvastatin Calcium) .Marland Kitchen.. 1 By Mouth Qd 2)  Metoprolol Tartrate 50 Mg Tabs (Metoprolol Tartrate) .... Take 1 Tablet By Mouth Two Times A Day 3)  Benazepril Hcl 20 Mg  Tabs (Benazepril Hcl) .... Take One Tablet Daily 4)  Hydrochlorothiazide 25 Mg  Tabs (Hydrochlorothiazide) .... Take One Tablet Daily 5)  Synthroid 150 Mcg Tabs (Levothyroxine Sodium) .Marland Kitchen.. 1 By Mouth Once Daily - Recheck Tsh in 2 Months 6)  Sertraline Hcl 50 Mg Tabs (Sertraline Hcl) .... Take 2  Tabs By Mouth Daily 7)  One Touch Ultra Test Strips .... Check Bs Daily;  Dx 250.00 8)  Glimepiride 4 Mg Tabs (Glimepiride) .Marland Kitchen.. 1 By Mouth Qam, 1/2 By Mouth With Dinner 9)  Metformin Hcl 500 Mg Tabs (Metformin Hcl) .Marland Kitchen.. 1 By Mouth Two Times A Day 10)  Actos 30 Mg Tabs (Pioglitazone Hcl) .Marland Kitchen.. 1 By Mouth Once Daily 11)  One Touch Ultra Lancets .... Use As Directed Daily. 12)  Onetouch Ultrasoft Lancets  Misc (Lancets) .... As Directed  Allergies (verified): No Known Drug Allergies  Past History:  Past Medical History: Reviewed history from 09/08/2009 and no changes required. DM HYPOTHYROIDISM w/ h/o goiter MORBID OBESITY   SLEEP APNEA, on CPAP DYSLIPIDEMIA  HYPERTENSION   OSTEOARTHRITIS Depression  Past Surgical History: Reviewed history from 10/24/2007 and no changes  required. Appendectomy Multiple orthpedic procedures R TKR  08-05-07  Family History: CAD - F (MI age 35), B MI dies at age 41 HTN - F DM - M, sister, brother stroke - no colon Ca - no breast Ca - no lung ca - M stomach ca - bro  Social History: Married, husband on disability since 31 (depression, dementia?) 1 child Occupation: Data processing manager Former Smoker Alcohol use-yes (occasionally) Drug use-no Regular exercise- no, no time  diet-- poor , has several things going on (see ROS) Does Patient Exercise:  no  Review of Systems General:  not eating well, husband ill, has no time for herself lost a brother to CAD  gaining wt  . CV:  Denies chest pain or discomfort, palpitations, and swelling of feet. Resp:  Denies cough, coughing up blood, and shortness of breath. GI:  Denies bloody stools, diarrhea, nausea, and vomiting. GU:  Denies dysuria and hematuria. Psych:  worried about her husband .  Physical Exam  General:  alert, well-developed, and overweight-appearing.    Neck:  no thyromegaly Lungs:  normal respiratory effort, no intercostal retractions, no accessory muscle use, and normal breath sounds.   Heart:  normal rate, regular rhythm, no murmur, and no gallop.   Abdomen:  soft, non-tender, no distention, no masses, no guarding, and no rigidity.  soft, non-tender, no distention, no masses,  no guarding, and no rigidity.   Extremities:  no pitting edema on either side today Psych:  Cognition and judgment appear intact. Alert and cooperative with normal attention span and concentration.  not anxious appearing and not depressed appearing.     Impression & Recommendations:  Problem # 1:  HEALTH SCREENING (ICD-V70.0) Td 2001-- repeat next year flu shot-- today  Pneumonia shot-- today  Cscope 2004: melanosis coli, no polyp due for a colonoscopy?, according with what we know, she is for a repeated cscope in  2014 (Dr Leone Payor)--- will check w/  GI  was referred  to  gyn  last year, she did go. Due for another visit  11- 2010: B mammogram followed by a R  mammogram benign --- next bilateral mammogram 12-2009  DEXA normal, 12/2008 on calcium and vitamin D  Family history of CAD, patient concern. best thing to do is keep under  control all cardiovascular risk factors. Diet and exercise are quite important. counseled . EKG sinus bradycardia, no acute changes Labs   Orders: EKG w/ Interpretation (93000)  Problem # 2:  DM (ICD-250.00) well-controlled, due for labs Her updated medication list for this problem includes:    Benazepril Hcl 20 Mg Tabs (Benazepril hcl) .Marland Kitchen... Take one tablet daily    Glimepiride 4 Mg Tabs (Glimepiride) .Marland Kitchen... 1 by mouth qam, 1/2 by mouth with dinner    Metformin Hcl 500 Mg Tabs (Metformin hcl) .Marland Kitchen... 1 by mouth two times a day    Actos 30 Mg Tabs (Pioglitazone hcl) .Marland Kitchen... 1 by mouth once daily    Aspirin 81 Mg Tbec (Aspirin) .Marland Kitchen... 1 a day    Labs Reviewed: Creat: 0.9 (06/08/2009)    Reviewed HgBA1c results: 6.4 (09/08/2009)  8.1 (06/08/2009)  Her updated medication list for this problem includes:    Benazepril Hcl 20 Mg Tabs (Benazepril hcl) .Marland Kitchen... Take one tablet daily    Glimepiride 4 Mg Tabs (Glimepiride) .Marland Kitchen... 1 by mouth qam, 1/2 by mouth with dinner    Metformin Hcl 500 Mg Tabs (Metformin hcl) .Marland Kitchen... 1 by mouth two times a day    Actos 30 Mg Tabs (Pioglitazone hcl) .Marland Kitchen... 1 by mouth once daily  Orders: TLB-A1C / Hgb A1C (Glycohemoglobin) (83036-A1C)  Problem # 3:  DEPRESSION (ICD-311) counseled , several things going on. Husband apparently developing  dementia, lost a brother to CAD Overall, patient thinks her dose of sertraline is appropriate for now   Her updated medication list for this problem includes:    Sertraline Hcl 50 Mg Tabs (Sertraline hcl) .Marland Kitchen... Take 2  tabs by mouth daily  Complete Medication List: 1)  Crestor 10 Mg Tabs (Rosuvastatin calcium) .Marland Kitchen.. 1 by mouth qd 2)  Metoprolol Tartrate 50 Mg Tabs  (Metoprolol tartrate) .... Take 1 tablet by mouth two times a day 3)  Benazepril Hcl 20 Mg Tabs (Benazepril hcl) .... Take one tablet daily 4)  Hydrochlorothiazide 25 Mg Tabs (Hydrochlorothiazide) .... Take one tablet daily 5)  Synthroid 150 Mcg Tabs (Levothyroxine sodium) .Marland Kitchen.. 1 by mouth once daily - recheck tsh in 2 months 6)  Sertraline Hcl 50 Mg Tabs (Sertraline hcl) .... Take 2  tabs by mouth daily 7)  Glimepiride 4 Mg Tabs (Glimepiride) .Marland Kitchen.. 1 by mouth qam, 1/2 by mouth with dinner 8)  Metformin Hcl 500 Mg Tabs (Metformin hcl) .Marland Kitchen.. 1 by mouth two times a day 9)  Actos 30 Mg Tabs (Pioglitazone hcl) .Marland Kitchen.. 1 by mouth once daily 10)  Ca and Vit D  11)  One Touch Ultra Test Strips  .... Check bs daily;  dx 250.00 12)  One Touch Ultra Lancets  .... Use as directed daily. 13)  Onetouch Ultrasoft Lancets Misc (Lancets) .... As directed 14)  Aspirin 81 Mg Tbec (Aspirin) .Marland Kitchen.. 1 a day  Other Orders: Admin 1st Vaccine (40981) Flu Vaccine 59yrs + (19147) Venipuncture (82956) TLB-BMP (Basic Metabolic Panel-BMET) (80048-METABOL) TLB-TSH (Thyroid Stimulating Hormone) (84443-TSH) TLB-Lipid Panel (80061-LIPID) T-Vitamin D (25-Hydroxy) (21308-65784) Pneumococcal Vaccine (69629) Admin of Any Addtl Vaccine (52841)  Patient Instructions: 1)  Please schedule a follow-up appointment in 4 months .    Risk Factors:  Exercise:  no    Flu Vaccine Consent Questions     Do you have a history of severe allergic reactions to this vaccine? no    Any prior history of allergic reactions to egg and/or gelatin? no    Do you have a sensitivity to the preservative Thimersol? no    Do you have a past history of Guillan-Barre Syndrome? no    Do you currently have an acute febrile illness? no    Have you ever had a severe reaction to latex? no    Vaccine information given and explained to patient? yes    Are you currently pregnant? no    Lot Number:[id number]   Exp Date:08/27/2010   Site Given  Left Deltoid  IMlu   Immunizations Administered:  Pneumonia Vaccine:    Vaccine Type: Pneumovax    Site: right deltoid    Mfr: Merck    Dose: 0.5 ml    Route: IM    Given by: Army Fossa CMA    Exp. Date: 05/16/2011    Lot #: [id number]   Appended Document: cpx/fasting//kn per GI, next colonoscopy 2014

## 2010-03-31 NOTE — Assessment & Plan Note (Signed)
Summary: ,COUGH,HEAD AND CHEST CONGESTION BS/ELEVATED/ALR   Vital Signs:  Patient profile:   60 year old female Height:      64.5 inches Weight:      259 pounds O2 Sat:      97 % on Room air Temp:     98.1 degrees F Pulse rate:   68 / minute BP sitting:   112 / 64  Vitals Entered By: Shary Decamp (June 08, 2009 11:14 AM)  O2 Flow:  Room air CC: acute only Comments  - cough x 1 week, productive (brown)  - chest pain with cough, now has back pain with cough   - nasal congestion, PN drip Shary Decamp  June 08, 2009 11:19 AM    History of Present Illness: Resp symptoms:   - cough x 1 week, productive (brown)  - had chest pain with cough, now has back pain with cough   - nasal congestion, PN drip  DM ambulatory CBGs AM 205 to 210, occasionally goes down  to 153; PM sugars 130s-90  thyroid good medication compliance   Current Medications (verified): 1)  Crestor 10 Mg Tabs (Rosuvastatin Calcium) .Marland Kitchen.. 1 By Mouth Qd 2)  Metoprolol Tartrate 50 Mg Tabs (Metoprolol Tartrate) .... Take 1 Tablet By Mouth Two Times A Day 3)  Benazepril Hcl 20 Mg  Tabs (Benazepril Hcl) .... Take One Tablet Daily 4)  Hydrochlorothiazide 25 Mg  Tabs (Hydrochlorothiazide) .... Take One Tablet Daily 5)  Synthroid 150 Mcg Tabs (Levothyroxine Sodium) .Marland Kitchen.. 1 By Mouth Once Daily - Recheck Tsh in 2 Months 6)  Sertraline Hcl 50 Mg Tabs (Sertraline Hcl) .... Take 1 1/2 Tabs By Mouth Daily 7)  One Touch Ultra Test Strips & Lancets .... Check Bs Daily;  Dx 250.00 8)  Glimepiride 4 Mg Tabs (Glimepiride) .Marland Kitchen.. 1 By Mouth Qam, 1/2 By Mouth With Dinner 9)  Metformin Hcl 500 Mg Tabs (Metformin Hcl) .Marland Kitchen.. 1 By Mouth Two Times A Day  Allergies (verified): No Known Drug Allergies  Past History:  Past Medical History: Reviewed history from 01/01/2009 and no changes required. DM HYPOTHYROIDISM w/ h/o goiter MORBID OBESITY   SLEEP APNEA, on CPAP DYSLIPIDEMIA  HYPERTENSION   OSTEOARTHRITIS Depression  Past  Surgical History: Reviewed history from 10/24/2007 and no changes required. Appendectomy Multiple orthpedic procedures R TKR  08-05-07  Social History: Reviewed history from 12/21/2008 and no changes required. Married, husband on disability since 54 (depression) 1 child Occupation: Data processing manager Former Smoker Alcohol use-yes (occasionally) Drug use-no Regular exercise-yes (couple times a week)  Review of Systems       denies fevers no nausea vomiting or diarrhea mild sore throat as  far as his diet, it needs  some improvement, she was referred to a nutritionist before but that didn't  happened. denies low sugar type of symptoms  Physical Exam  General:  alert, well-developed, and overweight-appearing.   Ears:  R ear normal and L ear normal.   Nose:  slightly congested Mouth:  no redness or discharge Lungs:  normal respiratory effort, no intercostal retractions, no accessory muscle use, and normal breath sounds.   Heart:  normal rate, regular rhythm, and no murmur.     Impression & Recommendations:  Problem # 1:  BRONCHITIS- ACUTE (ICD-466.0) see instructions  Her updated medication list for this problem includes:    Zithromax Z-pak 250 Mg Tabs (Azithromycin) .Marland Kitchen... As directed  Problem # 2:  DM (ICD-250.00) RF to a nutritionist  labs consider Actos Her updated medication  list for this problem includes:    Benazepril Hcl 20 Mg Tabs (Benazepril hcl) .Marland Kitchen... Take one tablet daily    Glimepiride 4 Mg Tabs (Glimepiride) .Marland Kitchen... 1 by mouth qam, 1/2 by mouth with dinner    Metformin Hcl 500 Mg Tabs (Metformin hcl) .Marland Kitchen... 1 by mouth two times a day  Orders: Venipuncture ([address]) TLB-A1C / Hgb A1C (Glycohemoglobin) (83036-A1C)  Problem # 3:  HYPOTHYROIDISM (ICD-244.9) we've recently decreased his synthroid dose, labs  updated medication list for this problem includes:    Synthroid 150 Mcg Tabs (Levothyroxine sodium) .Marland Kitchen... 1 by mouth once daily - recheck tsh in 2 months  Labs  Reviewed: TSH: 0.17 (03/16/2009)    HgBA1c: 7.6 (03/16/2009) Chol: 175 (12/21/2008)   HDL: 42.80 (12/21/2008)   LDL: 92 (04/24/2007)   TG: 206.0 (12/21/2008)  Orders: TLB-TSH (Thyroid Stimulating Hormone) (84443-TSH)  Problem # 4:  HYPERTENSION (ICD-401.9)  at goal  Her updated medication list for this problem includes:    Metoprolol Tartrate 50 Mg Tabs (Metoprolol tartrate) .Marland Kitchen... Take 1 tablet by mouth two times a day    Benazepril Hcl 20 Mg Tabs (Benazepril hcl) .Marland Kitchen... Take one tablet daily    Hydrochlorothiazide 25 Mg Tabs (Hydrochlorothiazide) .Marland Kitchen... Take one tablet daily    BP today: 112/64 Prior BP: 110/60 (03/16/2009)  Labs Reviewed: K+: 3.8 (12/21/2008) Creat: : 0.8 (12/21/2008)   Chol: 175 (12/21/2008)   HDL: 42.80 (12/21/2008)   LDL: 92 (04/24/2007)   TG: 206.0 (12/21/2008)  Orders: TLB-BMP (Basic Metabolic Panel-BMET) (80048-METABOL)  Complete Medication List: 1)  Crestor 10 Mg Tabs (Rosuvastatin calcium) .Marland Kitchen.. 1 by mouth qd 2)  Metoprolol Tartrate 50 Mg Tabs (Metoprolol tartrate) .... Take 1 tablet by mouth two times a day 3)  Benazepril Hcl 20 Mg Tabs (Benazepril hcl) .... Take one tablet daily 4)  Hydrochlorothiazide 25 Mg Tabs (Hydrochlorothiazide) .... Take one tablet daily 5)  Synthroid 150 Mcg Tabs (Levothyroxine sodium) .Marland Kitchen.. 1 by mouth once daily - recheck tsh in 2 months 6)  Sertraline Hcl 50 Mg Tabs (Sertraline hcl) .... Take 1 1/2 tabs by mouth daily 7)  One Touch Ultra Test Strips & Lancets  .... Check bs daily;  dx 250.00 8)  Glimepiride 4 Mg Tabs (Glimepiride) .Marland Kitchen.. 1 by mouth qam, 1/2 by mouth with dinner 9)  Metformin Hcl 500 Mg Tabs (Metformin hcl) .Marland Kitchen.. 1 by mouth two times a day 10)  Zithromax Z-pak 250 Mg Tabs (Azithromycin) .... As directed  Patient Instructions: 1)  rest, fluids, Tylenol, mucinex DM 2)  Zithromax for 5 days 3)  call if not better in a few days 4)  Please schedule a follow-up appointment in 3 months  (routine office  visit) Prescriptions: ZITHROMAX Z-PAK 250 MG TABS (AZITHROMYCIN) as directed  #1 x 0   Entered and Authorized by:   Nolon Rod. Iolanda Folson MD   Signed by:   Nolon Rod. Teagen Mcleary MD on 06/08/2009   Method used:   [facility]ron[facility] to        [facility]  [address] [id number]       [address]       [place], [place]  [address]       Ph: [phone] or [phone]       Fax: [phone]   RxID:   [id number]

## 2010-03-31 NOTE — Progress Notes (Signed)
Summary: Refill Request  Phone Note Refill Request Message from:  [facility] [address] Valinda Hoar #: [phone]  Refills Requested: Medication #1:  SYNTHROID 150 MCG TABS 1 by mouth once daily - RECHECK TSH IN 2 MONTHS   Dosage confirmed as above?Dosage Confirmed   Supply Requested: 3 months Next Appointment Scheduled: 7.13.11 Initial call taken by: Harold Barban,  June 17, 2009 8:30 AM    Prescriptions: SYNTHROID 150 MCG TABS (LEVOTHYROXINE SODIUM) 1 by mouth once daily - RECHECK TSH IN 2 MONTHS  #30 x 3   Entered by:   Kandice Hams   Authorized by:   Nolon Rod. Paz MD   Signed by:   Kandice Hams on 06/17/2009   Method used:   [facility]ed to ...       [facility]  [address] [id number]       [address]       [place], [place]  [address]       Ph: [phone] or [phone]       Fax: [phone]   RxID:   [id number]

## 2010-03-31 NOTE — Progress Notes (Signed)
  Phone Note Call from Patient Call back at [phone]   Caller: Patient Summary of Call: PT CALLED HAS HEAD AND CHEST CONGESTION WITH PRODUCTIVE COUGH. NOTICED BS ELEVATED, TAKING OTC MEDS. OV SCHEDULED IN AM .Kandice Hams  June 07, 2009 12:22 PM  Initial call taken by: Kandice Hams,  June 07, 2009 12:22 PM

## 2010-03-31 NOTE — Progress Notes (Signed)
Summary: Depression/stress--Needs help  Phone Note Call from Patient Call back at [phone]   Summary of Call: Patient left message on triage that she is not doing well, her antidepressants are not enough. Her daughter was found dead on Sunday and she is not dealing very well. Patient would like some help. Please advise. Initial call taken by: Kelly Brown CMA,  February 01, 2010 10:39 AM  Follow-up for Phone Call        I could see her today at around 4 PM.  or  tomorrow. If she is suicidal she definitely needs to be seen or go to the ER. In the meantime we could call Xanax 0.5 one p.o. t.i.d. p.r.n. severe anxiety or difficulty sleeping. #20 no refills.   Follow-up by:  E.  MD,  February 01, 2010 12:30 PM  Additional Follow-up for Phone Call Additional follow up Details #1::        Patient notified of the above and will come today. RX will be given at office visit. Additional Follow-up by: Kelly Brown CMA,  February 01, 2010 1:33 PM     Appended Document: Depression/stress--Needs help Rx was not given b/c I made patient appt. She has now cx that appt. Would you still like to provide the prescription? Please advise.  Appended Document: Depression/stress--Needs help ok if patient feels she needs that  Appended Document: Depression/stress--Needs help    Phone Note Other Incoming   Summary of Call: Patient notfied and would like Xanax, she has taken to her husband for eval. Due to the stress he has developed a numbness in his arm and she is taking him to the ER. She notes that she will call back for appt when she can. Initial call taken by: Kelly Brown CMA,  February 01, 2010 4:58 PM    New/Updated Medications: ALPRAZOLAM 0.5 MG TABS (ALPRAZOLAM) one p.o. t.i.d. p.r.n. severe anxiety or difficulty sleeping. Prescriptions: ALPRAZOLAM 0.5 MG TABS (ALPRAZOLAM) one p.o. t.i.d. p.r.n. severe anxiety or difficulty sleeping.  #20 x 0   Entered by:   Kelly Brown CMA  Authorized by:    E.  MD   Signed by:   Kelly Brown CMA on 02/01/2010   Method used:   Telephoned to ...       [facility]  [address] [id number]       [address]       [place], [place]  [address]       Ph: [phone] or [phone]       Fax: [phone]   RxID:   [id number]

## 2010-03-31 NOTE — Assessment & Plan Note (Signed)
Summary: 6 week fu/ns/kdc   Vital Signs:  Patient profile:   60 year old female Height:      64.5 inches Weight:      263.2 pounds BMI:     44.64 Pulse rate:   60 / minute BP sitting:   110 / 60  Vitals Entered By: Dena Billet CC: ROV Is Patient Diabetic? Yes Comments Blood sugar readings at home 98-180 average. pt can tell when it is getting in the 90's because she gets a headache pt. complains of gas thats "deadly" constant. Diarrhea every 4 days   History of Present Illness: diabetes follow-up started glimepiride and  Glucophage  in November Glucophage was increased currently on  1000 mg twice a day    Blood sugar readings at home 98-180 average pt can tell when it is getting in the 90's because she gets a headach pt. complains of gas constantly Diarrhea every 4 days as time goes by with a dose increase, s/e  decrease    Allergies: No Known Drug Allergies  Past History:  Past Medical History: Reviewed history from 01/01/2009 and no changes required. DM HYPOTHYROIDISM w/ h/o goiter MORBID OBESITY   SLEEP APNEA, on CPAP DYSLIPIDEMIA  HYPERTENSION   OSTEOARTHRITIS Depression  Past Surgical History: Reviewed history from 10/24/2007 and no changes required. Appendectomy Multiple orthpedic procedures R TKR  08-05-07  Social History: Reviewed history from 12/21/2008 and no changes required. Married, husband on disability since 20 (depression) 1 child Occupation: Data processing manager Former Smoker Alcohol use-yes (occasionally) Drug use-no Regular exercise-yes (couple times a week)  Review of Systems       diet-- has made several changes , staying away from carbs  exercise-- walks daily  GI:  Denies abdominal pain and bloody stools.  Physical Exam  General:  alert, well-developed, and overweight-appearing.   Lungs:  normal respiratory effort, no intercostal retractions, no accessory muscle use, and normal breath sounds.   Heart:  normal rate, regular  rhythm, and no murmur.     Impression & Recommendations:  Problem # 1:  DM (ICD-250.00)  unable to tolerate present dose of metformin drop from 1000 to 500mg  two times a day labs increase glimepiride to 4mg  a day, advised to watch for low sugars  will try to stay on a 2 drug regimen for now, consider add actos in the future doing better w/  life style  comprehensve reading material about DM provided  Her updated medication list for this problem includes:    Benazepril Hcl 20 Mg Tabs (Benazepril hcl) .Marland Kitchen... Take one tablet daily    Glimepiride 4 Mg Tabs (Glimepiride) .Marland Kitchen... 1 a day    Metformin Hcl 500 Mg Tabs (Metformin hcl) .Marland Kitchen... 1 by mouth two times a day  Orders: Venipuncture ([address]) TLB-ALT (SGPT) (84460-ALT) TLB-AST (SGOT) (84450-SGOT) TLB-A1C / Hgb A1C (Glycohemoglobin) (83036-A1C)  Problem # 2:  HYPOTHYROIDISM (ICD-244.9)  labs  Her updated medication list for this problem includes:    Synthroid 175 Mcg Tabs (Levothyroxine sodium) .Marland Kitchen... 1 a day  Labs Reviewed: TSH: 6.07 (12/21/2008)    HgBA1c: 12.6 (12/30/2008) Chol: 175 (12/21/2008)   HDL: 42.80 (12/21/2008)   LDL: 92 (04/24/2007)   TG: 206.0 (12/21/2008)  Orders: TLB-TSH (Thyroid Stimulating Hormone) (84443-TSH)  Complete Medication List: 1)  Crestor 10 Mg Tabs (Rosuvastatin calcium) .Marland Kitchen.. 1 by mouth qd 2)  Metoprolol Tartrate 50 Mg Tabs (Metoprolol tartrate) .... Take 1 tablet by mouth two times a day 3)  Benazepril Hcl 20 Mg Tabs (Benazepril hcl) .Marland KitchenMarland KitchenMarland Kitchen  Take one tablet daily 4)  Hydrochlorothiazide 25 Mg Tabs (Hydrochlorothiazide) .... Take one tablet daily 5)  Synthroid 175 Mcg Tabs (Levothyroxine sodium) .Marland Kitchen.. 1 a day 6)  Sertraline Hcl 50 Mg Tabs (Sertraline hcl) .... Take 1 1/2 tabs by mouth daily 7)  One Touch Ultra Test Strips & Lancets  .... Check bs daily;  dx 250.00 8)  Glimepiride 4 Mg Tabs (Glimepiride) .Marland Kitchen.. 1 a day 9)  Metformin Hcl 500 Mg Tabs (Metformin hcl) .Marland Kitchen.. 1 by mouth two times a day  Patient  Instructions: 1)  call with sugar readings in 2 weeks 2)  watch for low sugar symptoms  3)  Please schedule a follow-up appointment in 2 months.  Prescriptions: GLIMEPIRIDE 4 MG TABS (GLIMEPIRIDE) 1 a day  #30 x 3   Entered and Authorized by:   Elita Quick E. Kc Summerson MD   Signed by:   Nolon Rod. Glory Graefe MD on 03/16/2009   Method used:   Print then Give to Patient   RxID:   [id number]

## 2010-03-31 NOTE — Letter (Signed)
Summary: lab results   [facility] [address] [place], [place]  [address] Phone: [phone]      March 18, 2009   [facility] [address] [place], [place] [address]  RE:  LAB RESULTS  Dear  Ms. Heidelberger,  The following is an interpretation of your most recent lab tests.  Please take note of any instructions provided or changes to medications that have resulted from your lab work.  LIVER FUNCTION TESTS:  Good - no changes needed   THYROID STUDIES:  Decrease thyroid medication (see enclosed Rx) TSH: 0.17       - recheck your TSH in 2 months  - A1c was 7.6 (still not at goal). We will see how you do on your new diabetic regimen Call me if you have any questions or concerns.  [phone]   Sincerely Yours,    Shary Decamp, CMA

## 2010-04-11 ENCOUNTER — Ambulatory Visit: Payer: Self-pay | Admitting: Internal Medicine

## 2010-05-04 ENCOUNTER — Encounter: Payer: Self-pay | Admitting: Internal Medicine

## 2010-05-04 ENCOUNTER — Ambulatory Visit (INDEPENDENT_AMBULATORY_CARE_PROVIDER_SITE_OTHER): Payer: BC Managed Care – PPO | Admitting: Licensed Clinical Social Worker

## 2010-05-04 DIAGNOSIS — F331 Major depressive disorder, recurrent, moderate: Secondary | ICD-10-CM

## 2010-05-31 ENCOUNTER — Ambulatory Visit (INDEPENDENT_AMBULATORY_CARE_PROVIDER_SITE_OTHER): Payer: BC Managed Care – PPO | Admitting: Licensed Clinical Social Worker

## 2010-05-31 DIAGNOSIS — F331 Major depressive disorder, recurrent, moderate: Secondary | ICD-10-CM

## 2010-06-15 ENCOUNTER — Ambulatory Visit: Payer: Self-pay | Admitting: Internal Medicine

## 2010-06-15 ENCOUNTER — Encounter: Payer: Self-pay | Admitting: Internal Medicine

## 2010-06-28 ENCOUNTER — Encounter: Payer: Self-pay | Admitting: Internal Medicine

## 2010-06-28 ENCOUNTER — Ambulatory Visit (INDEPENDENT_AMBULATORY_CARE_PROVIDER_SITE_OTHER): Payer: BC Managed Care – PPO | Admitting: Internal Medicine

## 2010-06-28 VITALS — BP 134/86 | HR 53 | Wt 286.2 lb

## 2010-06-28 DIAGNOSIS — E119 Type 2 diabetes mellitus without complications: Secondary | ICD-10-CM

## 2010-06-28 DIAGNOSIS — F329 Major depressive disorder, single episode, unspecified: Secondary | ICD-10-CM

## 2010-06-28 DIAGNOSIS — I10 Essential (primary) hypertension: Secondary | ICD-10-CM

## 2010-06-28 DIAGNOSIS — E039 Hypothyroidism, unspecified: Secondary | ICD-10-CM

## 2010-06-28 NOTE — Assessment & Plan Note (Signed)
due for labs

## 2010-06-28 NOTE — Assessment & Plan Note (Signed)
Well controlled, labs  

## 2010-06-28 NOTE — Assessment & Plan Note (Addendum)
Lost her daughter in December 2011. Symptoms are currently well controlled.

## 2010-06-28 NOTE — Progress Notes (Signed)
  Subjective:    Patient ID: [name], female    DOB: October 07, 1950, 60 y.o.   MRN: [id number]  HPI  Depression-symptoms seem to be well-controlled. She takes her medication regularly and talks to a counselor DM-- good med compliance, checks her CBGs , occ in the 70s in the afternoon (gets a slt HA, and slt weak, no LOC) and < 120 in general HTN-- amb BP varies , good med compliance  Takes thyroid med regulalrly  Past Medical History  Diagnosis Date  . Goiter     h/o  . Diabetes mellitus   . Hypothyroidism     w/ h/o goiter  . Morbid obesity   . Sleep apnea     on CPAP  . Dyslipidemia   . Hypertension   . Osteoarthritis   . Depression    Past Surgical History  Procedure Date  . Appendectomy   . Orthopedic surgery     multiple procedures  . Total knee arthroplasty 08/05/07    right     Review of Systems  Constitutional:       Going to the [facility], doing more exercises  Respiratory: Negative for cough and wheezing.   Cardiovascular: Negative for chest pain, palpitations and leg swelling (occ LE edema at the end of the day ).  Genitourinary: Negative for dysuria, hematuria and difficulty urinating.       Objective:   Physical Exam  Constitutional: She is oriented to person, place, and time. She appears well-developed and well-nourished. No distress.  Cardiovascular: Normal rate, regular rhythm and normal heart sounds.   No murmur heard. Pulmonary/Chest: Effort normal and breath sounds normal. No respiratory distress. She has no wheezes. She has no rales.  Musculoskeletal: She exhibits edema (+/+++ B edema).  Neurological: She is alert and oriented to person, place, and time.  Psychiatric: She has a normal mood and affect. Her behavior is normal. Judgment and thought content normal.          Assessment & Plan:

## 2010-06-28 NOTE — Assessment & Plan Note (Signed)
Diagnosed with diabetes in 2010, the last medication added was Actos ~ 08-2009. She has some edema, recommend low-salt diet and leg elevation. She also is concerned about bladder cancer and Actos, the issue was discussed. She is asymptomatic. Observation for now. In general, all think she is doing well but will consider discontinuing Actos due to   edema although she had edema on and off for a while.

## 2010-06-29 LAB — BASIC METABOLIC PANEL
BUN: 26 mg/dL — ABNORMAL HIGH (ref 6–23)
Chloride: 104 mEq/L (ref 96–112)
Glucose, Bld: 68 mg/dL — ABNORMAL LOW (ref 70–99)
Potassium: 4.1 mEq/L (ref 3.5–5.1)

## 2010-06-30 ENCOUNTER — Telehealth: Payer: Self-pay | Admitting: *Deleted

## 2010-06-30 NOTE — Telephone Encounter (Signed)
Message copied by Army Fossa on Thu Jun 30, 2010 11:30 AM ------      Message from: [name]      Created: Thu Jun 30, 2010 11:05 AM             Advise patient:      Diabetes is under excellent control. She could actually decrease the dose of Actos from 30 mg to 15 mg. If she is able to split the 30 mg- tablet she can do that or we can call a new Rx.      Her kidney function is okay but that needs to be monitored closely.      Very good results

## 2010-06-30 NOTE — Telephone Encounter (Signed)
Message left for patient to return my call.  

## 2010-07-01 MED ORDER — PIOGLITAZONE HCL 15 MG PO TABS: 15.0000 mg | ORAL_TABLET | Freq: Every day | ORAL | Status: DC

## 2010-07-01 NOTE — Telephone Encounter (Signed)
I spoke w/ pt she is aware.  

## 2010-07-12 NOTE — Op Note (Signed)
[name], [name]              ACCOUNT NO.:  [id number]   MEDICAL RECORD NO.:  [id number]          PATIENT TYPE:  OIB   LOCATION:  0098                         FACILITY:  [facility]   PHYSICIAN:  Madlyn Frankel. Charlann Boxer, M.D.  DATE OF BIRTH:  1950/10/27   DATE OF PROCEDURE:  DATE OF DISCHARGE:                               OPERATIVE REPORT   PREOPERATIVE DIAGNOSIS:  Stiff, painful right total knee replacement.   POSTOPERATIVE DIAGNOSIS:  Stiff, painful right total knee replacement.   PROCEDURES:  1. Examination under anesthesia revealing findings of a range of      motion of 5-7 degrees lacking extension to 80 degrees of flexion.  2. Manipulation under anesthesia with a range of motion of the right      knee now from 3-5 degrees lacking extension to 120 degrees.   SURGEON:  Madlyn Frankel. Charlann Boxer, M.D.   ASSISTANT:  None.   ANESTHESIA:  Light Duramorph spinal.   COMPLICATIONS:  None.   INDICATIONS FOR PROCEDURE:  [name] is a 60 year old female patient  of mine approximately 2 months out from a right total knee replacement.  She had worked very diligently through physical therapy.  Unfortunately,  reached an end point with her range of motion, having a difficult time  maxxing out flexion.  I have discussed with her case manager my desire  to get her back to the operating room for manipulation to try to lyse  adhesions to get her maximal range of motion.  The risks and benefits of  this type procedure were discussed including persistent recurrent  stiffness, persistent stiffness versus recurrence of some discomfort.  Consent was obtained.   PROCEDURE IN DETAIL:  The patient was brought to the operative theater.  Once adequate anesthesia was established, the patient was positioned  comfortably on her stretcher.  Examination under anesthesia revealed the  above findings.  Her patella mobility was fairly normal without any  significant scarring.  I then flexed the hip and applied pressure  on the  proximal tibia and distal femur.  It had an audible and palpable lysis  of adhesions, flexxing all the way to have her calf make contact with  her hamstring.  This maxxed out at about 120 degrees.   Following this, I brought the knee to extension, applied gentle  pressure, tried to get some extra extension with minimal effort.  I did  not want any complications from this standpoint.   Following manipulation, she was brought to the recovery room in stable  condition.  She not require any general anesthetic; just a spinal block.   PLAN:  She will be admitted to the hospital for continued observation,  for range of motion and CPM.  Discharge to home tomorrow for physical  therapy.      Madlyn Frankel Charlann Boxer, M.D.  Electronically Signed     MDO/MEDQ  D:  10/01/2007  T:  10/01/2007  Job:  [id number] [id number]

## 2010-07-12 NOTE — H&P (Signed)
[name], [name]              ACCOUNT NO.:  [id number]   MEDICAL RECORD NO.:  [id number]          PATIENT TYPE:  INP   LOCATION:  NA                           FACILITY:  [facility]   PHYSICIAN:  Madlyn Frankel. Charlann Boxer, M.D.  DATE OF BIRTH:  [birth year]   DATE OF ADMISSION:  08/05/2007  DATE OF DISCHARGE:                              HISTORY & PHYSICAL   PROCEDURE:  Right total knee arthroplasty.   CHIEF COMPLAINT:  Right knee pain.   HISTORY OF PRESENT ILLNESS:  A 60 year old female with a history of  right knee pain secondary to osteoarthritis and has been refractory to  all conservative treatment including oral antiinflammatories, cortisone  injections, and knee scope.  She has had a significant reduction in the  quality of her life.  She has not been able to function at her work, at  her current level on the job.  She has been presurgically assessed for a  right total knee replacement.   PAST MEDICAL HISTORY:  Significant for:  1. Osteoarthritis.  2. Sleep apnea with the use of CPAP.  3. Hypertension.  4. Hypothyroidism.  5. Impaired vision.   PAST SURGICAL HISTORY:  Appendectomy in 1972 and arthroscopic knee  surgery in 2006.   FAMILY HISTORY:  Heart disease and cancer.   SOCIAL HISTORY:  Married.  Primary caregiver after surgery will be  daughter and family in the home.   DRUG ALLERGIES:  No known drug allergies.   MEDICATIONS:  1. Synthroid 150 mcg 1 p.o. every day.  2. Crestor 10 mg p.o. every day.  3. Metoprolol 50 mg p.o. b.i.d.  4. Benazepril 20 mg p.o. every day.  5. Hydrochlorothiazide 25 mg one p.o. every day.  6. Nabumetone 500 mg p.o. b.i.d.  7. Celebrex 200 mg 1 p.o. b.i.d. perioperatively use for 3 days      leading up 2 and 2 weeks afterwards.   REVIEW OF SYSTEMS:  None other than in the HPI.   PHYSICAL EXAMINATION:  VITAL SIGNS:  Pulse 60, respirations 18, and  blood pressure 142/78.  GENERAL:  Awake, alert, and oriented, well developed, well nourished,  in  no acute distress.  NECK:  Supple.  No carotid bruits.  CHEST/LUNGS:  Clear to auscultation bilaterally.  BREASTS:  Deferred.  HEART:  Regular rate and rhythm.  S1 and S2 distinct.  ABDOMEN:  Soft, nontender, and nondistended.  Bowel sounds are present.  GENITOURINARY:  Deferred.  EXTREMITIES:  She has anterior tenderness of her right knee.  Dorsalis  pedis pulse positive.  SKIN:  No signs of cellulitis.  NEUROLOGIC:  Intact distal sensibilities.   LABORATORY DATA:  EKG and chest x-ray are pending, presurgical testing.   IMPRESSION:  Right knee osteoarthritis, failing conservative measures.   PLAN OF ACTION:  Right total knee arthroplasty at [facility]  on August 05, 2007, by surgeon Dr. Durene Romans.  Questions were encouraged  and answers reviewed with both with [name] and her case manager  Bonny Rales at the time of H&P.   Postop medications provided at the time of history and physical,  which  included Lovenox, Robaxin, iron, aspirin, Colace, and MiraLax.  In  addition, Celebrex perioperative administration was provided with  instructions.     ______________________________  [name], [place]      Madlyn Frankel. Charlann Boxer, M.D.  Electronically Signed    BLM/MEDQ  D:  07/26/2007  T:  07/26/2007  Job:  [id number]   cc:   Willow Ora, MD

## 2010-07-12 NOTE — Assessment & Plan Note (Signed)
[facility]                             PULMONARY OFFICE NOTE   DEJON, JUNGMAN                     MRN:          [id number]  DATE:12/24/2006                            DOB:          02/19/1951    HISTORY OF PRESENT ILLNESS:  The patient is a 60 year old female who I  have been asked to see again for obstructive sleep apnea.  I have seen  her in the distant past where she was diagnosed with obstructive sleep  apnea in 2003.  She has been on a CPAP machine at a pressure of 11 since  that time and has been doing fairly well.  She currently has a CPAP mask  that is 61 years old, and has not been able to get one from [place].  She  has been wearing the CPAP every night, and feels that she sleeps fairly  well and feels fairly rested during the day, but not as rested as she  initially was when she started wearing CPAP.  Of note, her weight has  increased about 5 pounds over the last 2 years.  The patient typically  gets to bed at 10 p.m. and gets up out of bed around 4:15 to start her  day.  Again, she feels that her alertness and energy level during the  day is fairly good, but begins to trail off toward the end of the week.  She has no sleepiness with driving.   PAST MEDICAL HISTORY:  1. Hypertension.  2. Dyslipidemia.  3. History of sleep apnea as stated above.  4. Status post appendectomy in 1980.  5. History of multiple orthopedic procedures.   CURRENT MEDICATIONS:  1. Nabumetone 500 mg daily.  2. Synthroid 150 mcg daily.  3. Crestor 10 mg daily.  4. Hydrochlorothiazide 25 mg daily.  5. Metoprolol 50 mg b.i.d.  6. Benazepril 20 mg 1 daily.   The patient has no known drug allergies.   SOCIAL HISTORY:  She is married and has children.  She has a history of  smoking 1 pack per week for 10 years.  She has not smoked since 1978.   FAMILY HISTORY:  Remarkable for father having heart disease and a  pacemaker.  Mother and brother both have had  cancer.   REVIEW OF SYSTEMS:  As per history of present illness, also see patient  intake form documented in the chart.   PHYSICAL EXAMINATION:  IN GENERAL:  She is a morbidly obese female in no  acute distress.  Blood pressure is 120/72, pulse 55, temperature is 98, weight is 263  pounds, she is 5 feet 4 inches tall.  O2 saturation on room air is 96%.  HEENT:  Pupils are equal, round, and reactive to light and  accommodation.  Extraocular muscles are intact.  Nares are patent but  severely narrowed.  Oropharynx does show mild elongation of the soft  palate and uvula.  NECK:  Supple without JVD or lymphadenopathy.  No palpable thyromegaly.  CHEST:  Clear.  CARDIAC EXAM:  Reveals regular rate and rhythm.  ABDOMEN:  Soft, nontender, good  bowel sounds.  GENITAL/RECTAL/BREAST EXAM:  Not done and not indicated.  LOWER EXTREMITIES:  Trace edema.  Pulses are intact distally.  NEUROLOGICALLY:  Alert and oriented with no obvious motor deficits.   IMPRESSION:  History of severe obstructive sleep apnea.  The patient has  been very compliant with continuous positive airway pressure, but  currently has an old continuous positive airway pressure machine and has  not been able to get mask changes.  At this point in time, I do think  that she would benefit from a new continuous positive airway pressure  machine because of the large number of hours that she has on her old  one.  I also feel that it is worthwhile to re optimize her pressure so  that we are treating her at an acceptable level.  I have also asked that  she work more aggressively on trying to lose weight.   PLAN:  1. We will obtain a new continuous positive airway pressure machine      set at her current level.  2. New continuous positive airway pressure mask.  3. Will obtain an auto titrate device for 2 weeks to try and re      optimize her pressure, and I will let the patient know what that      value is.  4. Work on weight  loss.  5. The patient will follow up in 12 months or sooner if there are      problems.     Barbaraann Share, MD,FCCP  Electronically Signed    KMC/MedQ  DD: 12/24/2006  DT: 12/24/2006  Job #: [id number]   cc:   Willow Ora, MD

## 2010-07-12 NOTE — Consult Note (Signed)
NAMESTEFANA, [name]              ACCOUNT NO.:  [id number]   MEDICAL RECORD NO.:  [id number]          PATIENT TYPE:  INP   LOCATION:  1226                         FACILITY:  [facility]   PHYSICIAN:  Madolyn Frieze. Jens Som, MD, FACCDATE OF BIRTH:  1950-09-23   DATE OF CONSULTATION:  08/05/2007  DATE OF DISCHARGE:                                 CONSULTATION   [name] is a 60 year old female with past medical history of  hypertension, hyperlipidemia, hypothyroidism, obstructive sleep apnea  and now status post the right knee replacement whom I am asked to  evaluate for bradycardia.  Note:  The patient prior to surgery has not  had problems with dyspnea on exertion, orthopnea, PND, palpitations,  presyncope, syncope or exertional chest pain.  She occasionally does  have mild pedal edema.  She underwent right knee replacement today  secondary to osteoarthritis.  Postoperatively, she was noted to develop  nausea with retching.  She then became bradycardic with heart rates down  into 30s by report.  Note:  I do not have any strips to verify this, but  this as per nursing.  Note:  She did not have chest pain, palpitations,  presyncope or shortness of breath.  She did complain of some nausea with  this.  She was subsequently transferred to the unit, and we were asked  to further evaluate.  Note:  Since her nausea has improved, she has had  no further bradycardia.   MEDICATIONS AT HOME:  1. Synthroid 150 mcg p.o. daily.  2. Crestor 10 mg p.o. daily.  3. Lopressor 50 mg p.o. b.i.d.  4. Benazepril 20 mg daily.  5. Hydrochlorothiazide 25 mg daily.  6. Nabumetone 500 mg p.o. b.i.d.  7. Celebrex perioperatively.   ALLERGIES:  No known drug allergies.   SOCIAL HISTORY:  She does not smoke.  She does occasionally consume  alcohol.   FAMILY HISTORY:  Negative for coronary artery disease. (Her father had  coronary bypass graft, but it was in his 68s.)   PAST MEDICAL HISTORY:  Significant for:  1. Hypertension.  2. Hyperlipidemia..  3. There is no diabetes mellitus.  4. She does have does have history of hypothyroidism.  5. She has a history of obstructive sleep apnea.  6. She has had a previous appendectomy.   REVIEW OF SYSTEMS:  She denies any headaches or fevers or chills.  There  is no productive cough or hemoptysis.  There is no dysphagia,  odynophagia,  melena or hematochezia.  There is no dysuria or hematuria.  There is no rash or seizure activity.  There is no orthopnea or PND, but  there can be mild pedal edema.  The remaining systems are negative.   PHYSICAL EXAMINATION:  VITAL SIGNS:  Examination today shows blood  pressure of 127/63 and pulse of 65.  Her temperature is 97.3.  GENERAL:  She is well developed and obese.  She is in no acute distress  at present.  Skin is warm and dry.  She does not appear to be depressed,  and there is no peripheral clubbing.  BACK:  Normal.  HEENT:  Normal with normal eyelids.  NECK:  Supple with normal upstroke bilaterally.  No bruits noted.  There  is no jugular venous distention.  No thyromegaly.  CHEST:  Clear to auscultation with normal expansion.  CARDIOVASCULAR:  Regular rate and rhythm.  Normal S1-S2.  There are no  murmurs, rubs or gallops noted.  ABDOMEN:  Nontender, nondistended.  Positive bowel sounds.  No  hepatosplenomegaly and no mass appreciated.  There is no abdominal  bruit.  She has 2+ femoral pulses bilaterally and no bruits.  EXTREMITIES:  Show no edema, and I can palpate no cords.  She is status  post right knee replacement.  She has 2+ dorsalis pedis pulses  bilaterally.  NEUROLOGIC:  Exam is grossly intact.   The patient's laboratories show one CK-MB that is normal as well as a  troponin I.  Her hemoglobin on May 29 was 14.1.  Her potassium was 4  with a BUN and creatinine 21 and 0.96, respectively.   Electrocardiogram from today at 1538 shows sinus rhythm with no ST  changes.   DIAGNOSES:  1.  Bradycardia:  This appears to be secondary to a vasovagal episode      as it occurred when she was nauseated and retching.  It quickly      resolved, and she is now back in normal sinus rhythm.  We would      recommend holding her Lopressor for now.  She can be followed on      telemetry, and, if there is no further bradycardia, then we would      not recommend further cardiac evaluation.  2. Hypertension:  We can resume her blood pressure medications      tomorrow morning as her heart rate and blood pressure tolerate.  3. Hyperlipidemia:  She will continue her statin,  and her primary      care physician will follow this.  4. Status post knee replacement:  This will be managed per the      orthopedist.  5. Hypothyroidism:  She will continue on present medications, and this      is being managed by her primary care physicians.   We will be happy to follow while she is in the hospital.      Madolyn Frieze. Jens Som, MD, [facility]  Electronically Signed     BSC/MEDQ  D:  08/05/2007  T:  08/05/2007  Job:  [id number]

## 2010-07-12 NOTE — Op Note (Signed)
NAMELATERRA, LUBINSKI              ACCOUNT NO.:  [id number]   MEDICAL RECORD NO.:  [id number]          PATIENT TYPE:  INP   LOCATION:  0003                         FACILITY:  [facility]   PHYSICIAN:  Madlyn Frankel. Charlann Boxer, M.D.  DATE OF BIRTH:  1950/12/10   DATE OF PROCEDURE:  08/05/2007  DATE OF DISCHARGE:                               OPERATIVE REPORT   PREOPERATIVE DIAGNOSIS:  End-stage right knee osteoarthritis.   POSTOPERATIVE DIAGNOSIS:  End-stage right knee osteoarthritis.   PROCEDURE:  Right total knee replacement.   COMPONENTS USED:  DePuy rotating platform posterior stabilized knee  system, size 2.5 femur, 2.5 tibia, 15 mm posterior stabilized insert,  and a 38 patellar button.   SURGEON:  Charlann Boxer   ASSISTANT:  Dwyane Luo   ANESTHESIA:  Duramorph spinal.   DRAINS:  One.   COMPLICATIONS:  None.   TOURNIQUET TIME:  45 minutes at 250 mmHg.   INDICATION OF THE PROCEDURE:  Ms. Mapp is a 60 year old patient who  initially presented for workup evaluation.  She had arthroscopic  meniscal debridement 2 years prior and had obvious progressive arthritis  with correlating increasing symptoms.  This is after a hiatus of not  being seen.  Radiographs had indicated progression of disease  correlating with her symptoms.  At this point, limited options were  available.  She failed conservative measures and injections and wished  to proceed with definitive measures.  Subsequently, worker's comp  insurance surgery was scheduled and reviewed the risks and benefits of  surgery to benefit obviously in pain relief and improving her overall  function and quality life.  The risks of infection, DVT, component  failure, need for revision as well as the inherent risks of this type of  insurance carrier with the results were all reviewed, I had a lot of  confidence that Ms. Higley is going to do great.   PROCEDURE IN DETAIL:  The patient was brought to the operative theater.  Once adequate  anesthesia and preoperative antibiotics administered, the  patient was positioned supine with a thigh tourniquet placed, and the  right lower extremity was prescrubbed and prepped and draped in sterile  fashion.  Leg was exsanguinated, tourniquet elevated 250 mmHg.  Midline  incision was made followed by median arthrotomy.  Attention was first  directed to the patella following the initial debridement and  debridement of inferior and suprapatellar synovial fat pad tissue.  Precut measure was 22 mm.  I resected down to 14 mm, used the 38  patellar button with the button in place with the caliper measurement, I  went back to 2s-23 mm.   The lug holes were drilled, metal shim placed to protect the patella.   At this point, the femoral osteophytes were debrided.  The  intramedullary canal was opened with a drill.  I irrigated the canal to  prevent fat emboli.  An intramedullary rod was then passed and 11 mm of  bone resected off the distal femur due to slight flexion contracture.   Following this bone cut, I ended up having to make my tibial cut next  which  was not totally plain due to the fact that it was hard to get the  posterior skids or plates into position to slide for rotation of the  femur.  With the use of retractors, I was able to perform meniscectomies  anteriorly, subluxating the knee.  I then used the extramedullary guide  and made a perpendicular cut off the proximal tibia based off the high  side laterally at 10 mm.  At this point, I went back to the femur.  Using the posterior condylar referencing system, I sized the femur to be  a size 2.5.  Drill holes were pinned; this was perpendicular to the  [employer] or the AP axis.   The anterior, posterior, and chamfer cuts were then made without  difficulty or complication, no notching.  Final box cut was made based  off the lateral aspect of the distal femur.  The tibia was subluxated  anteriorly, and the 2.5 tibial half  tray appeared to sit nicely in the  cut surface.  I checked the alignment rod and was happy that we cut this  perpendicular in both planes.  At this point, I went ahead and drilled,  keel punched the tibia, and did a trial reduction.   At this point, I felt that with the 12.5 point poly insert, the knee  came out to slightly hyperextension; with the 15, it came out straight,  and the ligaments appeared to be stable.  Due to my release, due to the  fact of her varus in the knee, initially there was a little bit of play  medially, but I was not concerned about that, as the medial collateral  ligament was felt and noted to be completely intact.   At this point, all trial components were removed.  I irrigated with  normal saline solution, injected the synovial capsule layer with 60 mL  of 0.25% Marcaine with epinephrine and 1 mL of Toradol.  Final  components were opened and cement mixed.  Components were then cemented  into position, and the knee was brought out to extension with a 15 mm  insert.  Extruded cement was removed.  Once cement cured, excessive  cement was removed throughout the knee.  Once I had removed the  polyethylene insert and was satisfied that there was no visualized  cement throughout the knee, particularly on the lateral side, the knee  was reirrigated with bulb irrigation and then the final 15 units are  placed.  I placed a medium Hemovac drain when we irrigated the knee out  with another 200-300 mL of normal saline solution.  The knee was then  brought to flexion.  The extensor mechanism was reapproximated using #1  Vicryl.  The main wound was closed with 2-0 Vicryl and running 4-0  Monocryl.  The knee was cleaned, dried and dressed sterilely with Steri-  Strips and a sterile bulky Jones dressing.  She was brought to the  recovery room tolerating the procedure quite well.      Madlyn Frankel Charlann Boxer, M.D.  Electronically Signed     MDO/MEDQ  D:  08/05/2007  T:   08/05/2007  Job:  [id number]

## 2010-07-15 NOTE — Discharge Summary (Signed)
[name], [name]              ACCOUNT NO.:  [id number]   MEDICAL RECORD NO.:  [id number]          PATIENT TYPE:  INP   LOCATION:  1612                         FACILITY:  [facility]   PHYSICIAN:  Madlyn Frankel. Charlann Boxer, M.D.  DATE OF BIRTH:  November 07, 1950   DATE OF ADMISSION:  08/05/2007  DATE OF DISCHARGE:  08/08/2007                               DISCHARGE SUMMARY   ADMITTING DIAGNOSES:  1. Osteoarthritis.  2. Hypothyroidism.  3. Sleep apnea with the use of CPAP.  4. Hypertension.  5. Impaired vision.   DISCHARGE DIAGNOSES:  1. Osteoarthritis.  2. Sleep apnea.  3. Hypertension.  4. Hypothyroidism.  5. Impaired vision.   HISTORY OF PRESENT ILLNESS:  A 60 year old female with a history of  right knee pain secondary to osteoarthritis that has been refractory to  all conservative treatment including all anti-inflammatories, cortisone  injections and activity modifications with a subsequent  reduction in  quality of life.  Presurgically assessed for right total knee  replacement.   CONSULTATIONS:  Dr. Olga Millers for vasovagal episode and  bradycardia.   PROCEDURE:  Right total knee replacement by surgeon, Dr. Durene Romans.  Assistant Dwyane Luo, P.A.C.   LABORATORY DATA:  Preadmission CBC - hemoglobin and hematocrit were 14.1  and 40.9, platelets 269.  At the time of discharge, hemoglobin 11.6,  hematocrit 32.7, platelets 228.  White cell differential all within  normal limits.  Coagulation all within normal limits.  Routine chemistry  - sodium 140, potassium 4, glucose 209, creatinine 0.96 preadmission.  At the time of admission, sodium 134, potassium 4.1, glucose __________,  creatinine 0.83.  Kidney function good, GFR greater than 16.  Calcium  was 7.9 at the time of discharge.  Cardiac marker series run found CK to  be 198, elevating up to 331.  No indication of myocardial infarction.  Normal troponin of 0.02, 0.02 and 0.01 respectively.  Urinalysis  preadmission with moderate  leukocyte esterase.  Negative for nitrites.   CARDIOLOGY:  EKG showed normal sinus rhythm preadmission __________  bradycardia.  She did have some PVC __________ bigeminies.   RADIOLOGY:  Chest two view with no active chest disease.   HOSPITAL COURSE:  The patient underwent a right total knee replacement  and tolerated the procedure well.  On postoperative day #1, had an  episode of some decreased heart rate with her nausea and vomiting.  She  had nausea and retching.  When she had the decreased pulses, she was  given Reglan.  We did an EKG and a cardiac panel, as well as cardiac  consult.  Dr. Jens Som did see the patient, as well as followed up with  her, attributing this to a vasovagal episode.  The rest of her course of  stay was unremarkable.  Her nausea cleared up on the next day and her  blood pressure remained stable.  Hemodynamically she remained stable.  We discontinued her Hemovac.  with PT and OT, she was weightbearing as  tolerated and made very good progressed.  She was transferred off the  telemetry floor back up to the orthopedic floor and did  well with  physical therapy.  The pain was well controlled.  Heplocked her IV, and  when we saw her on postoperative day #3, she was doing well and  ambulating at least 90 feet in a single episode and was ready to go  home.   DISCHARGE DISPOSITION:  Discharged home in stable and improved  condition.   DISCHARGE PHYSICAL THERAPY:  Weightbearing as tolerated with a rolling  walker.  Continue to work on quad function.   DISCHARGE DIET:  Regular as tolerated with patient.   __________.   DISCHARGE MEDICATIONS:  1. Lovenox 40 mg subcutaneous 24h x11 days.  2. Robaxin 500 mg p.o. q.6h.  3. Iron 325 mg p.o. t.i.d. x2 weeks.  4. Aspirin 325 mg p.o. daily x4 weeks.  5. Colace 100 mg p.o. b.i.d.  6. MiraLax 17 gm p.o. daily.  7. Vicodin 5/325 one to two p.o. q.4-6h. p.r.n. pain.  8. Synthroid 125 mcg one p.o. q.a.m.  9. HCTZ 25  mg p.o. daily.  10.Benazepril 20 mg p.o. daily.  11.Crestor 10 mg p.o. daily.  12.Metoprolol 50 mg p.o. b.i.d.  13.Nabumetone 500 mg stop.   DISCHARGE FOLLOW UP:  Follow up with Dr. Charlann Boxer __________.     ______________________________  Yetta Glassman. [address], [place]      Madlyn Frankel. Charlann Boxer, M.D.  Electronically Signed    BLM/MEDQ  D:  09/03/2007  T:  09/03/2007  Job:  [id number]

## 2010-07-15 NOTE — Op Note (Signed)
[name], CHRISTIAN              ACCOUNT NO.:  [id number]   MEDICAL RECORD NO.:  [id number]          PATIENT TYPE:  AMB   LOCATION:  DAY                          FACILITY:  [facility]   PHYSICIAN:  Madlyn Frankel. Charlann Boxer, M.D.  DATE OF BIRTH:  [birth year]   DATE OF PROCEDURE:  09/02/2004  DATE OF DISCHARGE:                                 OPERATIVE REPORT   PREOPERATIVE DIAGNOSES:  Right knee medial meniscal tear associated with  chondromalacia of the patella and chondromalacia of the medial compartment.   POSTOPERATIVE DIAGNOSES/FINDINGS:  1.  Grade 2-3 chondromalacia within the apex of the lateral facet of the      patella, femoral compartment, intact trochlea.  2.  Grade 3-4 chondromalacia within the medial compartment, primarily      involving the medial aspect of the medial tibial plateau and medial      femoral condyle.  3.  Degenerative complex tearing to the medial meniscus involving the entire      posterior horn and the mid-body.   PROCEDURES:  Right knee diagnostic and operative arthroscopy with  patellofemoral and medial compartment chondroplasty and a subtotal medial  meniscectomy.   SURGEON:  Madlyn Frankel. Charlann Boxer, M.D.   ASSISTANT:  None.   ANESTHESIA:  General, plus local at the end.   COMPLICATIONS:  None.   INDICATION FOR PROCEDURE:  [name] is a 60 year old female who had  presented to the office with a history of some right-sided knee pain.  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  She had  persistent problems and had failed conservative measures including cortisone  injection.  An MRI had been done in May.  The MRI revealed that she had a  degenerative-type tear in the medial meniscus with moderate tricompartmental  degenerative changes.  There was an associated stress reaction in the medial  tibial plateau.   After reviewing the risks and benefits of surgery and given failure of  conservative measures, consent was obtained.   PROCEDURE  IN DETAIL:  The patient was brought to the operating theater.  Once adequate anesthesia and preoperative antibiotics were administered, 1 g  of Ancef, the patient was positioned supine with the right leg in a leg  holder. The right lower extremity was then prepped and draped in a sterile  fashion.  Standard inferolateral, superolateral, and inferomedial portals  were utilized.  Diagnostic evaluation of  the knee was carried out revealing  the above-noted findings.  The inferomedial portal was utilized as a working  portal.  Utilizing biting baskets as well as a 3.5-mm Engineer, site, the  medial meniscectomy was carried out as well as the patellofemoral  chondroplasty.  A chondroplasty was also carried out in the medial  compartment.  Minor synovectomy was carried out for exposure only and not  considered a billable and insignificant procedure.  Examination of the  lateral compartment revealed a pristine lateral compartment, both meniscal  and chondral surfaces.  The ACL was intact.   Assessment was advanced degenerative changes this medial compartment with  moderate changes to  the patellofemoral.  The lateral compartment appears to  be intact.   Following re-examination of the knee to make sure there were no other loose  fragments of cartilage, the instrumentation was removed.  The portals were  reapproximated using 4-0 nylon.  The knee was then dressed into a sterile  bulky Jones dressing.  Note that I had infused 20 cc of lidocaine into the  portal sites preoperatively and postoperatively injected 0.25% Marcaine with  epinephrine into the knee intra-articularly.  Once the knee was dressed, it  was placed into an ice pack.  She was transferred to the room, extubated,  and in stable condition.       MDO/MEDQ  D:  09/02/2004  T:  09/02/2004  Job:  [id number]

## 2010-08-09 ENCOUNTER — Ambulatory Visit (INDEPENDENT_AMBULATORY_CARE_PROVIDER_SITE_OTHER): Payer: BC Managed Care – PPO | Admitting: Licensed Clinical Social Worker

## 2010-08-09 DIAGNOSIS — F331 Major depressive disorder, recurrent, moderate: Secondary | ICD-10-CM

## 2010-09-10 ENCOUNTER — Other Ambulatory Visit: Payer: Self-pay | Admitting: Internal Medicine

## 2010-09-12 ENCOUNTER — Other Ambulatory Visit: Payer: Self-pay | Admitting: *Deleted

## 2010-09-12 MED ORDER — PIOGLITAZONE HCL 15 MG PO TABS: 15.0000 mg | ORAL_TABLET | Freq: Every day | ORAL | Status: DC

## 2010-11-02 ENCOUNTER — Ambulatory Visit (INDEPENDENT_AMBULATORY_CARE_PROVIDER_SITE_OTHER): Payer: BC Managed Care – PPO | Admitting: Licensed Clinical Social Worker

## 2010-11-02 DIAGNOSIS — F331 Major depressive disorder, recurrent, moderate: Secondary | ICD-10-CM

## 2010-11-08 ENCOUNTER — Other Ambulatory Visit: Payer: Self-pay | Admitting: Internal Medicine

## 2010-11-18 ENCOUNTER — Encounter: Payer: Self-pay | Admitting: Pulmonary Disease

## 2010-11-18 ENCOUNTER — Ambulatory Visit (INDEPENDENT_AMBULATORY_CARE_PROVIDER_SITE_OTHER): Payer: BC Managed Care – PPO | Admitting: Pulmonary Disease

## 2010-11-18 VITALS — BP 124/78 | HR 58 | Temp 98.0°F | Ht 64.0 in | Wt 291.0 lb

## 2010-11-18 DIAGNOSIS — G4733 Obstructive sleep apnea (adult) (pediatric): Secondary | ICD-10-CM

## 2010-11-18 NOTE — Assessment & Plan Note (Signed)
The patient is doing well with CPAP, and feels that she is having restorative sleep and denies significant daytime alertness.  I have asked her to keep up with mask changes and supplies, and to work aggressively on weight loss.  She will followup with me in one year or sooner if having issues.

## 2010-11-18 NOTE — Progress Notes (Signed)
  Subjective:    Patient ID: [name], female    DOB: Apr 02, 1950, 60 y.o.   MRN: [id number]  HPI Patient comes in today for followup of her known obstructive sleep apnea.  She is wearing CPAP compliantly, and is having no issues with mask fit or pressure.  She feels that she is sleeping well, and his not having any significant issue with daytime alertness.   Review of Systems  Constitutional: Negative for fever and unexpected weight change.  HENT: Positive for sneezing. Negative for ear pain, nosebleeds, congestion, sore throat, rhinorrhea, trouble swallowing, dental problem, postnasal drip and sinus pressure.   Eyes: Negative for redness and itching.  Respiratory: Positive for cough. Negative for chest tightness, shortness of breath and wheezing.   Cardiovascular: Positive for leg swelling. Negative for palpitations.  Gastrointestinal: Negative for nausea and vomiting.  Genitourinary: Negative for dysuria.  Musculoskeletal: Negative for joint swelling.  Skin: Negative for rash.  Neurological: Negative for headaches.  Hematological: Does not bruise/bleed easily.  Psychiatric/Behavioral: Positive for dysphoric mood. The patient is nervous/anxious.        Objective:   Physical Exam Obese female in no acute distress No skin breakdown or pressure necrosis from the CPAP mask Lower extremities with mild edema, no cyanosis noted Alert, does not appear to be sleepy, moves all 4 extremities.       Assessment & Plan:

## 2010-11-18 NOTE — Patient Instructions (Signed)
Stay on cpap, keep up with mask changes and supplies. Work on weight loss followup with me in one year.  

## 2010-11-23 LAB — CBC
Hemoglobin: 14.1
MCHC: 34.5
MCV: 85.6
RBC: 4.78
RDW: 14.3

## 2010-11-23 LAB — URINALYSIS, ROUTINE W REFLEX MICROSCOPIC
Bilirubin Urine: NEGATIVE
Glucose, UA: NEGATIVE
Hgb urine dipstick: NEGATIVE
Ketones, ur: NEGATIVE
Nitrite: NEGATIVE
Specific Gravity, Urine: 1.028
pH: 6.5

## 2010-11-23 LAB — DIFFERENTIAL
Basophils Absolute: 0
Basophils Relative: 1
Eosinophils Absolute: 0.1
Monocytes Absolute: 0.4
Monocytes Relative: 7
Neutro Abs: 4

## 2010-11-23 LAB — BASIC METABOLIC PANEL
CO2: 25
Calcium: 9.1
Chloride: 105
Creatinine, Ser: 0.96
Glucose, Bld: 209 — ABNORMAL HIGH
Sodium: 140

## 2010-11-23 LAB — APTT: aPTT: 28

## 2010-11-24 LAB — BASIC METABOLIC PANEL
BUN: 16
BUN: 9
CO2: 23
Chloride: 104
Chloride: 107
GFR calc non Af Amer: >60
GFR calc non Af Amer: >60
Glucose, Bld: 154 — ABNORMAL HIGH
Glucose, Bld: 166 — ABNORMAL HIGH
Potassium: 4.1
Potassium: 4.1
Sodium: 134 — ABNORMAL LOW
Sodium: 139

## 2010-11-24 LAB — CBC
HCT: 33.7 — ABNORMAL LOW
HCT: 34.5 — ABNORMAL LOW
Hemoglobin: 11.6 — ABNORMAL LOW
Hemoglobin: 11.9 — ABNORMAL LOW
MCV: 85.8
MCV: 85.9
Platelets: 209
RDW: 14.8
RDW: 14.9
WBC: 9.1

## 2010-11-24 LAB — CARDIAC PANEL(CRET KIN+CKTOT+MB+TROPI)
CK, MB: 2.6
CK, MB: 3.3
Relative Index: 1.3
Total CK: 198 — ABNORMAL HIGH
Troponin I: 0.02

## 2010-11-24 LAB — ABO/RH: ABO/RH(D): O POS

## 2010-11-24 LAB — TYPE AND SCREEN

## 2010-11-25 LAB — URINALYSIS, ROUTINE W REFLEX MICROSCOPIC
Bilirubin Urine: NEGATIVE
Glucose, UA: NEGATIVE
Ketones, ur: NEGATIVE
Protein, ur: NEGATIVE
pH: 6.5

## 2010-11-25 LAB — CBC
HCT: 43.3
Hemoglobin: 14.6
MCHC: 33.7
MCV: 86.8
RDW: 14.4

## 2010-11-25 LAB — DIFFERENTIAL
Basophils Absolute: 0.1
Basophils Relative: 1
Eosinophils Absolute: 0.2
Eosinophils Relative: 2
Monocytes Absolute: 0.7

## 2010-11-25 LAB — BASIC METABOLIC PANEL
BUN: 14
Chloride: 105
Creatinine, Ser: 0.85
Glucose, Bld: 111 — ABNORMAL HIGH
Potassium: 4.3

## 2010-12-29 ENCOUNTER — Encounter: Payer: Self-pay | Admitting: Internal Medicine

## 2010-12-29 ENCOUNTER — Ambulatory Visit (INDEPENDENT_AMBULATORY_CARE_PROVIDER_SITE_OTHER): Payer: BC Managed Care – PPO | Admitting: Internal Medicine

## 2010-12-29 DIAGNOSIS — I10 Essential (primary) hypertension: Secondary | ICD-10-CM

## 2010-12-29 DIAGNOSIS — F3289 Other specified depressive episodes: Secondary | ICD-10-CM

## 2010-12-29 DIAGNOSIS — Z23 Encounter for immunization: Secondary | ICD-10-CM

## 2010-12-29 DIAGNOSIS — F329 Major depressive disorder, single episode, unspecified: Secondary | ICD-10-CM

## 2010-12-29 DIAGNOSIS — E119 Type 2 diabetes mellitus without complications: Secondary | ICD-10-CM

## 2010-12-29 DIAGNOSIS — Z0000 Encounter for general adult medical examination without abnormal findings: Secondary | ICD-10-CM | POA: Insufficient documentation

## 2010-12-29 LAB — CBC WITH DIFFERENTIAL/PLATELET
Eosinophils Relative: 3 % (ref 0.0–5.0)
HCT: 37.4 % (ref 36.0–46.0)
Hemoglobin: 12.6 g/dL (ref 12.0–15.0)
Lymphs Abs: 1.4 10*3/uL (ref 0.7–4.0)
MCV: 87.3 fl (ref 78.0–100.0)
Monocytes Absolute: 0.4 10*3/uL (ref 0.1–1.0)
Monocytes Relative: 6.8 % (ref 3.0–12.0)
Neutro Abs: 3.7 10*3/uL (ref 1.4–7.7)
Platelets: 285 10*3/uL (ref 150.0–400.0)
WBC: 5.7 10*3/uL (ref 4.5–10.5)

## 2010-12-29 LAB — COMPREHENSIVE METABOLIC PANEL
Albumin: 4 g/dL (ref 3.5–5.2)
BUN: 28 mg/dL — ABNORMAL HIGH (ref 6–23)
CO2: 23 mEq/L (ref 19–32)
Calcium: 9.1 mg/dL (ref 8.4–10.5)
Glucose, Bld: 88 mg/dL (ref 70–99)
Sodium: 140 mEq/L (ref 135–145)
Total Bilirubin: 0.4 mg/dL (ref 0.3–1.2)

## 2010-12-29 LAB — TSH: TSH: 3.83 u[IU]/mL (ref 0.35–5.50)

## 2010-12-29 LAB — LIPID PANEL
Cholesterol: 168 mg/dL (ref 0–200)
LDL Cholesterol: 92 mg/dL (ref 0–99)
Total CHOL/HDL Ratio: 3
VLDL: 23 mg/dL (ref 0.0–40.0)

## 2010-12-29 MED ORDER — BENAZEPRIL HCL 40 MG PO TABS: 40.0000 mg | ORAL_TABLET | Freq: Every day | ORAL | Status: DC

## 2010-12-29 MED ORDER — SERTRALINE HCL 50 MG PO TABS: ORAL_TABLET | ORAL | Status: DC

## 2010-12-29 MED ORDER — ALPRAZOLAM 0.5 MG PO TABS: ORAL_TABLET | ORAL | Status: DC

## 2010-12-29 MED ORDER — GLUCOSE BLOOD VI STRP: ORAL_STRIP | Status: DC

## 2010-12-29 NOTE — Assessment & Plan Note (Addendum)
BP elavated today, amb BPs sometimes elevated Increase lotensin from 20 to 40 mg

## 2010-12-29 NOTE — Assessment & Plan Note (Addendum)
Td 2001 and  today flu shot-- today  Pneumonia shot-- 2011 Shingles shot discussed  Cscope 2004: melanosis coli, no polyp----> neaxt 2014 (Dr Leone Payor) Needs to set an appointment w/  gyn this year 11- 2010: B mammogram followed by a R  mammogram benign --- was due for a  bilateral mammogram 12-2009: strongly rec to schedule it  DEXA normal, 12/2008 on calcium and vitamin D Labs Diet-exercise discussed

## 2010-12-29 NOTE — Progress Notes (Signed)
  Subjective:    Patient ID: [name], female    DOB: 09/18/1950, 60 y.o.   MRN: [id number]  HPI Complete physical exam We also discussed her depression, she lost her daughter last year, obviously still dealing with that issue but overall better Needs refill on several medications. Continue with some lower extremity edema, worse at night. Ambulatory blood pressures are occasionally elevated.  Past Medical History  Diagnosis Date  . Goiter     h/o  . Diabetes mellitus   . Hypothyroidism     w/ h/o goiter  . Morbid obesity   . Sleep apnea     on CPAP  . Dyslipidemia   . Hypertension   . Osteoarthritis   . Depression    Past Surgical History  Procedure Date  . Appendectomy   . Orthopedic surgery     multiple procedures  . Total knee arthroplasty 08/05/07    right   History   Social History  . Marital Status: Married    Spouse Name: N/A    Number of Children: 1  . Years of Education: N/A   Occupational History  . logistics    Social History Main Topics  . Smoking status: Former Smoker -- 0.5 packs/day for 10 years    Types: Cigarettes    Quit date: 02/28/1980  . Smokeless tobacco: Never Used  . Alcohol Use: Yes     occasionally  . Drug Use: No  . Sexually Active: Not on file   Other Topics Concern  . Not on file   Social History Narrative   1 child: lost child 01/2010   Family History  Problem Relation Age of Onset  . Coronary artery disease Father     F MI age 65, 40 at age 65  . Diabetes      M , brother amd sister  . Lung cancer Mother   . Stomach cancer Brother   . Hypertension Father   . Colon cancer Neg Hx   . Breast cancer Neg Hx      Review of Systems  Constitutional: Negative for diaphoresis.  Respiratory: Negative for cough and shortness of breath.   Cardiovascular: Negative for chest pain. Leg swelling: occ LE edema in the afternoon.  Gastrointestinal: Negative for abdominal pain and blood in stool.  Genitourinary: Negative  for dysuria and difficulty urinating.       Objective:   Physical Exam  Constitutional: She is oriented to person, place, and time. She appears well-developed. No distress.       Overweight appearing  HENT:  Head: Normocephalic and atraumatic.  Neck: No thyromegaly present.  Cardiovascular: Normal rate, regular rhythm and normal heart sounds.   No murmur heard. Pulmonary/Chest: Effort normal and breath sounds normal. No respiratory distress. She has no wheezes. She has no rales.  Abdominal: Soft. Bowel sounds are normal. She exhibits no distension. There is no tenderness. There is no rebound and no guarding.  Musculoskeletal: Edema: +/+++ edema B legs.  Neurological: She is alert and oriented to person, place, and time.  Skin: Skin is warm and dry. She is not diaphoretic.  Psychiatric: She has a normal mood and affect. Her behavior is normal. Judgment and thought content normal.          Assessment & Plan:

## 2010-12-29 NOTE — Assessment & Plan Note (Signed)
Edema noticed again, since she is in low doses of glucophage and actos and she still has edema will d/c actos  , consider increase other meds

## 2010-12-29 NOTE — Patient Instructions (Signed)
Please see your gynecologist and schedule a Mammogram Check the  blood pressure 2 or 3 times a week, be sure it is less than 135/85. If it is consistently higher, let me know

## 2010-12-29 NOTE — Assessment & Plan Note (Signed)
Overall slightly better, patient is counseled. Refill medications. Has seen a counselor before and plans  to see her when necessary

## 2011-01-03 ENCOUNTER — Telehealth: Payer: Self-pay

## 2011-01-03 NOTE — Telephone Encounter (Signed)
Patient advised and will keep 3 month follow up

## 2011-03-26 ENCOUNTER — Other Ambulatory Visit: Payer: Self-pay | Admitting: Internal Medicine

## 2011-03-27 NOTE — Telephone Encounter (Signed)
Refill done.  

## 2011-03-31 ENCOUNTER — Ambulatory Visit: Payer: BC Managed Care – PPO | Admitting: Internal Medicine

## 2011-07-01 ENCOUNTER — Other Ambulatory Visit: Payer: Self-pay | Admitting: Internal Medicine

## 2011-07-03 NOTE — Telephone Encounter (Signed)
Refill done.  

## 2011-09-25 ENCOUNTER — Other Ambulatory Visit: Payer: Self-pay | Admitting: Internal Medicine

## 2011-09-25 NOTE — Telephone Encounter (Signed)
Refill done.  

## 2011-09-27 ENCOUNTER — Ambulatory Visit (INDEPENDENT_AMBULATORY_CARE_PROVIDER_SITE_OTHER): Payer: BC Managed Care – PPO | Admitting: Internal Medicine

## 2011-09-27 VITALS — BP 134/64 | HR 60 | Temp 98.2°F | Wt 264.0 lb

## 2011-09-27 DIAGNOSIS — I10 Essential (primary) hypertension: Secondary | ICD-10-CM

## 2011-09-27 DIAGNOSIS — M6530 Trigger finger, unspecified finger: Secondary | ICD-10-CM

## 2011-09-27 DIAGNOSIS — E039 Hypothyroidism, unspecified: Secondary | ICD-10-CM

## 2011-09-27 DIAGNOSIS — E119 Type 2 diabetes mellitus without complications: Secondary | ICD-10-CM

## 2011-09-27 LAB — ALT: ALT: 15 U/L (ref 0–35)

## 2011-09-27 LAB — BASIC METABOLIC PANEL
BUN: 27 mg/dL — ABNORMAL HIGH (ref 6–23)
Chloride: 105 mEq/L (ref 96–112)
Potassium: 4.3 mEq/L (ref 3.5–5.1)

## 2011-09-27 LAB — TSH: TSH: 2.87 u[IU]/mL (ref 0.35–5.50)

## 2011-09-27 LAB — HEMOGLOBIN A1C: Hgb A1c MFr Bld: 7.2 % — ABNORMAL HIGH (ref 4.6–6.5)

## 2011-09-27 NOTE — Assessment & Plan Note (Signed)
Good compliance with Synthroid, check a TSH 

## 2011-09-27 NOTE — Assessment & Plan Note (Signed)
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 If she's prescribed prednisone or anti-inflammatories, reminded that they may increase her blood sugar or cause gastritis.

## 2011-09-27 NOTE — Progress Notes (Signed)
  Subjective:    Patient ID: [name], female    DOB: [birth year], 61 y.o.   MRN: [id number]  HPI Acute visit 2 weeks history of a trigger finger phenomena at the fourth right finger. She did have some pain but no swelling in the area. She was recommended at her job that if the problem  was job related, they need to take care of that, not me. She does work at a [employer] and uses her hands a lot. Since she is here, we also discussed her chronic medical problems: Diabetes, good medication compliance, CBGs between 80 and 100. No symptoms consistent with low sugars Hypertension, good medication compliance, ambulatory BPs around 130/64 Hypothyroidism and hyperlipidemia, good medication compliance.   Past Medical History  Diagnosis Date  . Goiter     h/o  . Diabetes mellitus   . Hypothyroidism     w/ h/o goiter  . Morbid obesity   . Sleep apnea     on CPAP  . Dyslipidemia   . Hypertension   . Osteoarthritis   . Depression    Past Surgical History  Procedure Date  . Appendectomy   . Orthopedic surgery     multiple procedures  . Total knee arthroplasty 08/05/07    right     Review of Systems No chest pain or shortness or breath No nausea, vomiting, diarrhea.     Objective:   Physical Exam General -- alert, well-developed, and overweight appearing. No apparent distress.  Lungs -- normal respiratory effort, no intercostal retractions, no accessory muscle use, and normal breath sounds.   Heart-- normal rate, regular rhythm, no murmur, and no gallop.   Extremities-- inspection and palpation of the hands and wrists normal except for the fourth right finger, noted a very mild trigger phenomena , she does have some tenderness at the base of the fourth finger, palmar aspect. No swelling. Neurologic-- alert & oriented X3 and strength normal in all extremities. Psych-- Cognition and judgment appear intact. Alert and cooperative with normal attention span and concentration.   not anxious appearing and not depressed appearing.       Assessment & Plan:

## 2011-09-27 NOTE — Assessment & Plan Note (Signed)
Apparently well-controlled, good medication compliance, check the A1c

## 2011-09-27 NOTE — Assessment & Plan Note (Signed)
Seems to be well-controlled, check a BMP 

## 2011-09-28 ENCOUNTER — Encounter: Payer: Self-pay | Admitting: Internal Medicine

## 2011-10-04 ENCOUNTER — Other Ambulatory Visit: Payer: Self-pay | Admitting: *Deleted

## 2011-10-04 MED ORDER — METFORMIN HCL 1000 MG PO TABS: 1000.0000 mg | ORAL_TABLET | Freq: Two times a day (BID) | ORAL | Status: DC

## 2011-11-20 ENCOUNTER — Ambulatory Visit: Payer: BC Managed Care – PPO | Admitting: Pulmonary Disease

## 2011-11-22 ENCOUNTER — Ambulatory Visit (INDEPENDENT_AMBULATORY_CARE_PROVIDER_SITE_OTHER): Payer: BC Managed Care – PPO | Admitting: Pulmonary Disease

## 2011-11-22 ENCOUNTER — Encounter: Payer: Self-pay | Admitting: Pulmonary Disease

## 2011-11-22 VITALS — BP 124/68 | HR 58 | Temp 98.2°F | Ht 64.0 in | Wt 269.4 lb

## 2011-11-22 DIAGNOSIS — G4733 Obstructive sleep apnea (adult) (pediatric): Secondary | ICD-10-CM

## 2011-11-22 NOTE — Progress Notes (Signed)
  Subjective:    Patient ID: [name], female    DOB: 1950/10/13, 61 y.o.   MRN: [id number]  HPI The patient comes in today for followup of her known obstructive sleep apnea.  She is wearing CPAP compliantly, and is having no issues with mask fit or pressure.  She feels that she is sleeping well, and is satisfied with her daytime alertness.  She has lost 22 pounds since her last visit, and I congratulated her on this.   Review of Systems  Constitutional: Negative for fever and unexpected weight change.  HENT: Positive for congestion, rhinorrhea, sneezing, postnasal drip and sinus pressure. Negative for ear pain, nosebleeds, sore throat, trouble swallowing and dental problem.   Eyes: Negative for redness and itching.  Respiratory: Positive for cough. Negative for chest tightness, shortness of breath and wheezing.   Cardiovascular: Positive for leg swelling. Negative for palpitations.  Gastrointestinal: Negative for nausea and vomiting.  Genitourinary: Negative for dysuria.  Musculoskeletal: Negative for joint swelling.  Skin: Negative for rash.  Neurological: Negative for headaches.  Hematological: Bruises/bleeds easily.  Psychiatric/Behavioral: Negative for dysphoric mood. The patient is not nervous/anxious.        Objective:   Physical Exam Obese female in no acute distress Nose without purulence or discharge noted No skin breakdown or pressure necrosis from the CPAP mask Lower extremities with minimal edema, no cyanosis Alert and oriented, moves all 4 extremities.       Assessment & Plan:

## 2011-11-22 NOTE — Assessment & Plan Note (Signed)
The patient is doing very well with her CPAP, and feels that her symptoms are adequately controlled.  She has lost 22 pounds since last visit, and has been keeping up with her mask fit and supplies.  She will followup with me in one year.

## 2011-11-22 NOTE — Patient Instructions (Addendum)
Continue working on weight loss.  You are doing great! Keep up with mask changes and supplies. followup with me in one year.

## 2011-12-31 ENCOUNTER — Other Ambulatory Visit: Payer: Self-pay | Admitting: Internal Medicine

## 2012-01-01 NOTE — Telephone Encounter (Signed)
Refill done.  

## 2012-02-01 ENCOUNTER — Encounter: Payer: Self-pay | Admitting: Internal Medicine

## 2012-02-01 ENCOUNTER — Ambulatory Visit (INDEPENDENT_AMBULATORY_CARE_PROVIDER_SITE_OTHER): Payer: BC Managed Care – PPO | Admitting: Internal Medicine

## 2012-02-01 VITALS — BP 126/72 | HR 47 | Temp 97.9°F | Ht 64.5 in | Wt 260.0 lb

## 2012-02-01 DIAGNOSIS — Z2911 Encounter for prophylactic immunotherapy for respiratory syncytial virus (RSV): Secondary | ICD-10-CM

## 2012-02-01 DIAGNOSIS — F329 Major depressive disorder, single episode, unspecified: Secondary | ICD-10-CM

## 2012-02-01 DIAGNOSIS — M1990 Unspecified osteoarthritis, unspecified site: Secondary | ICD-10-CM

## 2012-02-01 DIAGNOSIS — Z0000 Encounter for general adult medical examination without abnormal findings: Secondary | ICD-10-CM

## 2012-02-01 DIAGNOSIS — E119 Type 2 diabetes mellitus without complications: Secondary | ICD-10-CM

## 2012-02-01 DIAGNOSIS — Z23 Encounter for immunization: Secondary | ICD-10-CM

## 2012-02-01 LAB — COMPREHENSIVE METABOLIC PANEL
ALT: 17 U/L (ref 0–35)
AST: 19 U/L (ref 0–37)
Albumin: 3.9 g/dL (ref 3.5–5.2)
BUN: 21 mg/dL (ref 6–23)
Calcium: 9.2 mg/dL (ref 8.4–10.5)
Chloride: 106 mEq/L (ref 96–112)
Potassium: 4.6 mEq/L (ref 3.5–5.1)
Total Protein: 7.3 g/dL (ref 6.0–8.3)

## 2012-02-01 LAB — LIPID PANEL
Cholesterol: 152 mg/dL (ref 0–200)
HDL: 42.1 mg/dL (ref >39.00)
Triglycerides: 166 mg/dL — ABNORMAL HIGH (ref 0.0–149.0)
VLDL: 33.2 mg/dL (ref 0.0–40.0)

## 2012-02-01 LAB — CBC WITH DIFFERENTIAL/PLATELET
Basophils Absolute: 0 10*3/uL (ref 0.0–0.1)
Hemoglobin: 12.8 g/dL (ref 12.0–15.0)
Lymphocytes Relative: 30.3 % (ref 12.0–46.0)
Monocytes Relative: 6.4 % (ref 3.0–12.0)
Neutro Abs: 3.1 10*3/uL (ref 1.4–7.7)
RDW: 15.5 % — ABNORMAL HIGH (ref 11.5–14.6)

## 2012-02-01 NOTE — Patient Instructions (Signed)

## 2012-02-01 NOTE — Assessment & Plan Note (Addendum)
Last A1c 7.2, I recommended to increase metformin. Currently CBGs seem well control, hypoglycemia in the afternoon. Her biggest meal of the day is lunch. Feet exam negative today. Plan: Labs Decrease in glimeperide from 4 and 2 mg to 4 mg AM

## 2012-02-01 NOTE — Assessment & Plan Note (Signed)
Having problems with the left knee, she is considering see orthopedic surgery

## 2012-02-01 NOTE — Assessment & Plan Note (Addendum)
Td 2012 flu shot-- today  Pneumonia shot-- 2011 Shingles shot  today   Cscope 2004: melanosis coli, no polyp----> next 2014 (Dr Leone Payor) To see  Gyn 02-2012 11- 2010: B mammogram followed by a R  mammogram benign , had a  MMG 2012 ? Not sure, no report found. Has appointment for a MMG 02-2012 DEXA normal, 12/2008 on calcium and vitamin D Labs Diet-exercise discussed

## 2012-02-01 NOTE — Assessment & Plan Note (Signed)
Good compliance with medication, from time to time gets down but no serious symptoms. Plan: No change

## 2012-02-01 NOTE — Progress Notes (Signed)
  Subjective:    Patient ID: [name], female    DOB: 1950/08/21, 61 y.o.   MRN: [id number]  HPI Complete physical exam  Past Medical History  Diagnosis Date  . Goiter     h/o  . Diabetes mellitus   . Hypothyroidism     w/ h/o goiter  . Morbid obesity   . Sleep apnea     on CPAP  . Dyslipidemia   . Hypertension   . Osteoarthritis   . Depression    Past Surgical History  Procedure Date  . Appendectomy   . Orthopedic surgery     multiple procedures  . Total knee arthroplasty 08/05/07    right   History   Social History  . Marital Status: Married    Spouse Name: N/A    Number of Children: 1  . Years of Education: N/A   Occupational History  . logistics    Social History Main Topics  . Smoking status: Former Smoker -- 0.5 packs/day for 10 years    Types: Cigarettes    Quit date: 02/28/1980  . Smokeless tobacco: Never Used  . Alcohol Use: Yes     Comment: occasionally  . Drug Use: No  . Sexually Active: Not on file   Other Topics Concern  . Not on file   Social History Narrative   1 child: lost child 01/2010 ---Diet: trying to improve ---Exercise: more than before but still little ---   Family History  Problem Relation Age of Onset  . Coronary artery disease Father     F MI age 78, 69 at age 23  . Diabetes      M , brother amd sister  . Lung cancer Mother   . Stomach cancer Brother   . Hypertension Father   . Colon cancer Neg Hx   . Breast cancer Neg Hx     Review of Systems Good compliance with medication. CBGs less than 120 the morning, occasionally 70 in the afternoon, occasional low sugar symptoms in the afternoon. Ambulatory BPs wnl No chest pain, shortness of breath no nausea, vomiting, blood in the stools. Denies any numbness or pain in the feet    Objective:   Physical Exam General -- alert, well-developed, and overweight appearing. No apparent distress.  Neck --no thyromegaly Lungs -- normal respiratory effort, no intercostal  retractions, no accessory muscle use, and normal breath sounds.   Heart-- normal rate, regular rhythm, no murmur, and no gallop.   Abdomen--soft, non-tender, no distention, no masses, no HSM, no guarding, and no rigidity.   DIABETIC FEET EXAM: No lower extremity edema Normal pedal pulses bilaterally Skin normal and nails slt thick Pinprick examination of the feet normal. Neurologic-- alert & oriented X3 and strength normal in all extremities. Psych-- Cognition and judgment appear intact. Alert and cooperative with normal attention span and concentration.  not anxious appearing and not depressed appearing.       Assessment & Plan:

## 2012-02-02 ENCOUNTER — Encounter: Payer: Self-pay | Admitting: Internal Medicine

## 2012-02-05 ENCOUNTER — Other Ambulatory Visit: Payer: Self-pay | Admitting: Internal Medicine

## 2012-02-06 NOTE — Telephone Encounter (Signed)
Refill done.  

## 2012-02-07 ENCOUNTER — Telehealth: Payer: Self-pay

## 2012-02-07 MED ORDER — SITAGLIPTIN PHOS-METFORMIN HCL 50-1000 MG PO TABS: 1.0000 | ORAL_TABLET | Freq: Two times a day (BID) | ORAL | Status: DC

## 2012-02-07 NOTE — Telephone Encounter (Signed)
Called home number and was instructed by patient's husband to call patient on cell, Left message on patient's cell for her to return call when available

## 2012-02-07 NOTE — Telephone Encounter (Signed)
Spoke with patient, patient verbalized understanding of all lab results, patient requested rx be sent to [facility]. Patient already with pending follow-up appointment to see Dr.Paz in 4 months

## 2012-02-07 NOTE — Telephone Encounter (Signed)
Message copied by Maurice Small on Wed Feb 07, 2012  3:55 PM ------      Message from: [name]      Created: Tue Feb 06, 2012  9:14 PM       Advise patient:      Cholesterol is well controlled      Diabetes needs better control      Other labs wnl      Plan:      D/c metformin      Start janumet 50-1000 1 tab bid #60, 6 RF      Other meds the same       F/u 4 months as recommended

## 2012-03-15 ENCOUNTER — Other Ambulatory Visit: Payer: Self-pay | Admitting: Internal Medicine

## 2012-03-15 ENCOUNTER — Encounter: Payer: Self-pay | Admitting: *Deleted

## 2012-03-15 NOTE — Telephone Encounter (Signed)
Pt made aware rx is ready to be picked up at front desk along with controlled substance contract.

## 2012-03-15 NOTE — Telephone Encounter (Signed)
Ok to refill? Last OV 12.5.13 Last filled 11.1.12 #270 1RF

## 2012-03-15 NOTE — Telephone Encounter (Signed)
Done

## 2012-03-25 ENCOUNTER — Other Ambulatory Visit: Payer: Self-pay | Admitting: Internal Medicine

## 2012-03-25 NOTE — Telephone Encounter (Signed)
Refill done.  

## 2012-04-12 ENCOUNTER — Encounter: Payer: Self-pay | Admitting: Internal Medicine

## 2012-04-15 ENCOUNTER — Encounter: Payer: Self-pay | Admitting: Internal Medicine

## 2012-04-25 ENCOUNTER — Encounter: Payer: Self-pay | Admitting: Internal Medicine

## 2012-05-15 ENCOUNTER — Other Ambulatory Visit: Payer: Self-pay

## 2012-05-15 DIAGNOSIS — Z1231 Encounter for screening mammogram for malignant neoplasm of breast: Secondary | ICD-10-CM

## 2012-05-31 ENCOUNTER — Ambulatory Visit: Payer: BC Managed Care – PPO | Admitting: Internal Medicine

## 2012-06-04 ENCOUNTER — Ambulatory Visit: Payer: BC Managed Care – PPO | Admitting: Internal Medicine

## 2012-06-10 ENCOUNTER — Ambulatory Visit (INDEPENDENT_AMBULATORY_CARE_PROVIDER_SITE_OTHER): Payer: BC Managed Care – PPO | Admitting: Internal Medicine

## 2012-06-10 VITALS — BP 118/70 | HR 48 | Temp 98.0°F | Wt 255.0 lb

## 2012-06-10 DIAGNOSIS — E785 Hyperlipidemia, unspecified: Secondary | ICD-10-CM

## 2012-06-10 DIAGNOSIS — E119 Type 2 diabetes mellitus without complications: Secondary | ICD-10-CM

## 2012-06-10 DIAGNOSIS — E039 Hypothyroidism, unspecified: Secondary | ICD-10-CM

## 2012-06-10 MED ORDER — SITAGLIPTIN PHOS-METFORMIN HCL 50-1000 MG PO TABS: 1.0000 | ORAL_TABLET | Freq: Two times a day (BID) | ORAL | Status: DC

## 2012-06-10 MED ORDER — ROSUVASTATIN CALCIUM 10 MG PO TABS: 10.0000 mg | ORAL_TABLET | Freq: Every day | ORAL | Status: DC

## 2012-06-10 NOTE — Assessment & Plan Note (Signed)
Refill medications

## 2012-06-10 NOTE — Patient Instructions (Addendum)
Next visit in 4-5 months Call if low sugars are more frequent   Hypoglycemia (Low Blood Sugar) Hypoglycemia is when the glucose (sugar) in your blood is too low. Hypoglycemia can happen for many reasons. It can happen to people with or without diabetes. Hypoglycemia can develop quickly and can be a medical emergency.  CAUSES  Having hypoglycemia does not mean that you will develop diabetes. Different causes include:  Missed or delayed meals or not enough carbohydrates eaten.  Medication overdose. This could be by accident or deliberate. If by accident, your medication may need to be adjusted or changed.  Exercise or increased activity without adjustments in carbohydrates or medications.  A nerve disorder that affects body functions like your heart rate, blood pressure and digestion (autonomic neuropathy).  A condition where the stomach muscles do not function properly (gastroparesis). Therefore, medications may not absorb properly.  The inability to recognize the signs of hypoglycemia (hypoglycemic unawareness).  Absorption of insulin  may be altered.  Alcohol consumption.  Pregnancy/menstrual cycles/postpartum. This may be due to hormones.  Certain kinds of tumors. This is very rare. SYMPTOMS   Sweating.  Hunger.  Dizziness.  Blurred vision.  Drowsiness.  Weakness.  Headache.  Rapid heart beat.  Shakiness.  Nervousness. DIAGNOSIS  Diagnosis is made by monitoring blood glucose in one or all of the following ways:  Fingerstick blood glucose monitoring.  Laboratory results. TREATMENT  If you think your blood glucose is low:  Check your blood glucose, if possible. If it is less than 70 mg/dl, take one of the following:  3-4 glucose tablets.   cup juice (prefer clear like apple).   cup "regular" soda pop.  1 cup milk.  -1 tube of glucose gel.  5-6 hard candies.  Do not over treat because your blood glucose (sugar) will only go too high.  Wait 15  minutes and recheck your blood glucose. If it is still less than 70 mg/dl (or below your target range), repeat treatment.  Eat a snack if it is more than one hour until your next meal. Sometimes, your blood glucose may go so low that you are unable to treat yourself. You may need someone to help you. You may even pass out or be unable to swallow. This may require you to get an injection of glucagon, which raises the blood glucose. HOME CARE INSTRUCTIONS  Check blood glucose as recommended by your caregiver.  Take medication as prescribed by your caregiver.  Follow your meal plan. Do not skip meals. Eat on time.  If you are going to drink alcohol, drink it only with meals.  Check your blood glucose before driving.  Check your blood glucose before and after exercise. If you exercise longer or different than usual, be sure to check blood glucose more frequently.  Always carry treatment with you. Glucose tablets are the easiest to carry.  Always wear medical alert jewelry or carry some form of identification that states that you have diabetes. This will alert people that you have diabetes. If you have hypoglycemia, they will have a better idea on what to do. SEEK MEDICAL CARE IF:   You are having problems keeping your blood sugar at target range.  You are having frequent episodes of hypoglycemia.  You feel you might be having side effects from your medicines.  You have symptoms of an illness that is not improving after 3-4 days.  You notice a change in vision or a new problem with your vision. SEEK IMMEDIATE  MEDICAL CARE IF:   You are a family member or friend of a person whose blood glucose goes below 70 mg/dl and is accompanied by:  Confusion.  A change in mental status.  The inability to swallow.  Passing out. Document Released: 02/13/2005 Document Revised: 05/08/2011 Document Reviewed: 06/12/2011 [facility] Patient Information 2013 [place], [place].

## 2012-06-10 NOTE — Assessment & Plan Note (Signed)
Basal last A1c, we changed from metformin to janumet. Seems to be doing well. Check the A1c, occasionally has low sugars, see instructions.

## 2012-06-10 NOTE — Progress Notes (Signed)
  Subjective:    Patient ID: [name], female    DOB: 10-Apr-1950, 62 y.o.   MRN: [id number]  HPI Routine office visit Diabetes, we changed from metformin to janumet, good tolerance and compliance. CBGs Ranged from 90-100. Very seldom sugars are low, this weekend she was working on her yard, have a mild headache and sugar was 65.  Past Medical History  Diagnosis Date  . Goiter     h/o  . Diabetes mellitus   . Hypothyroidism     w/ h/o goiter  . Morbid obesity   . Sleep apnea     on CPAP  . Dyslipidemia   . Hypertension   . Osteoarthritis   . Depression    Past Surgical History  Procedure Laterality Date  . Appendectomy    . Orthopedic surgery      multiple procedures  . Total knee arthroplasty  08/05/07    right  . Colonoscopy     Review of Systems Occasionally has numbness at the tips of the fingers, mostly on the 4-5th fingers, usually associated with reading a book . Denies any neck pain per se. No bladder or bowel incontinence. Good medication compliance with all other medications, ambulatory BPs usually 120/70.       Objective:   Physical Exam General -- alert, well-developed, No apparent distress.   Neck --no Tender to palpation of the cervical spine  Lungs -- normal respiratory effort, no intercostal retractions, no accessory muscle use, and normal breath sounds.   Heart-- normal rate, regular rhythm, no murmur, and no gallop.    Extremities-- no pretibial edema bilaterally  Neurologic-- alert & oriented X3 .Speech, gait and motor are intact. DTRs symmetric. Pinprick examination of upper extremities normal. Psych-- Cognition and judgment appear intact. Alert and cooperative with normal attention span and concentration.  not anxious appearing and not depressed appearing.       Assessment & Plan:   Occasionall numbness at the fingertips, likely a entrapment neuropathy, recommend to minimize the time she has her elbow flexed. If not better she will let me  know.

## 2012-06-10 NOTE — Assessment & Plan Note (Signed)
Due for labs

## 2012-06-11 ENCOUNTER — Encounter: Payer: Self-pay | Admitting: Internal Medicine

## 2012-06-11 ENCOUNTER — Ambulatory Visit
Admission: RE | Admit: 2012-06-11 | Discharge: 2012-06-11 | Disposition: A | Discharge status: Home / Self Care | Payer: BC Managed Care – PPO | Source: Ambulatory Visit

## 2012-06-11 DIAGNOSIS — Z1231 Encounter for screening mammogram for malignant neoplasm of breast: Secondary | ICD-10-CM

## 2012-06-17 ENCOUNTER — Encounter: Payer: Self-pay | Admitting: *Deleted

## 2012-07-08 ENCOUNTER — Encounter: Payer: Self-pay | Admitting: Internal Medicine

## 2012-08-21 ENCOUNTER — Ambulatory Visit (AMBULATORY_SURGERY_CENTER): Payer: BC Managed Care – PPO | Admitting: *Deleted

## 2012-08-21 VITALS — Ht 64.0 in | Wt 259.2 lb

## 2012-08-21 DIAGNOSIS — Z1211 Encounter for screening for malignant neoplasm of colon: Secondary | ICD-10-CM

## 2012-08-21 MED ORDER — NA SULFATE-K SULFATE-MG SULF 17.5-3.13-1.6 GM/177ML PO SOLN: ORAL | Status: DC

## 2012-08-21 NOTE — Progress Notes (Signed)
No egg or soy allergy 

## 2012-08-22 ENCOUNTER — Encounter: Payer: Self-pay | Admitting: Internal Medicine

## 2012-09-04 ENCOUNTER — Encounter: Payer: Self-pay | Admitting: Internal Medicine

## 2012-09-04 ENCOUNTER — Ambulatory Visit (AMBULATORY_SURGERY_CENTER): Payer: BC Managed Care – PPO | Admitting: Internal Medicine

## 2012-09-04 VITALS — BP 117/72 | HR 52 | Temp 97.1°F | Resp 14 | Ht 64.0 in | Wt 259.0 lb

## 2012-09-04 DIAGNOSIS — K5730 Diverticulosis of large intestine without perforation or abscess without bleeding: Secondary | ICD-10-CM

## 2012-09-04 DIAGNOSIS — Z1211 Encounter for screening for malignant neoplasm of colon: Secondary | ICD-10-CM

## 2012-09-04 MED ORDER — SODIUM CHLORIDE 0.9 % IV SOLN: 500.0000 mL | INTRAVENOUS | Status: DC

## 2012-09-04 NOTE — Op Note (Signed)
[facility] [address] [place], [address]   COLONOSCOPY PROCEDURE REPORT  PATIENT: [name], [name]  MR#: [id number] BIRTHDATE: Aug 26, 1950 , 62  yrs. old GENDER: Female ENDOSCOPIST: Iva Boop, MD, [facility] PROCEDURE DATE:  09/04/2012 PROCEDURE:   Colonoscopy, screening ASA CLASS:   Class III INDICATIONS:Average risk patient for colorectal cancer and Last colonoscopy performed 10 years ago. MEDICATIONS: propofol (Diprivan) 250mg  IV, MAC sedation, administered by CRNA, and These medications were titrated to patient response per physician's verbal order  DESCRIPTION OF PROCEDURE:   After the risks benefits and alternatives of the procedure were thoroughly explained, informed consent was obtained.  A digital rectal exam revealed no abnormalities of the rectum.   The [id number]  endoscope was introduced through the anus and advanced to the cecum, which was identified by both the appendix and ileocecal valve. No adverse events experienced.   The quality of the prep was excellent using Suprep  The instrument was then slowly withdrawn as the colon was fully examined.      COLON FINDINGS: Moderate diverticulosis was noted in the sigmoid colon.   The colon mucosa was otherwise normal.   A right colon retroflexion was performed.  Retroflexed views revealed no abnormalities. The time to cecum=3 minutes 05 seconds.  Withdrawal time=7 minutes 02 seconds.  The scope was withdrawn and the procedure completed. COMPLICATIONS: There were no complications.  ENDOSCOPIC IMPRESSION: 1.   Moderate diverticulosis was noted in the sigmoid colon 2.   The colon mucosa was otherwise normal - excellent prep - second screening  RECOMMENDATIONS: Repeat Colonscopy in 10 years - 2024   eSigned:  Iva Boop, MD, [facility] 09/04/2012 9:00 AM   cc: The Patient

## 2012-09-04 NOTE — Progress Notes (Signed)
Patient did not experience any of the following events: a burn prior to discharge; a fall within the facility; wrong site/side/patient/procedure/implant event; or a hospital transfer or hospital admission upon discharge from the facility. (G8907) Patient did not have preoperative order for IV antibiotic SSI prophylaxis. (G8918)  

## 2012-09-04 NOTE — Progress Notes (Signed)
Pt's sugar in admitting-115 and in recovery 94.  Apple juice given  Pt denies feeling like her sugar is low

## 2012-09-04 NOTE — Patient Instructions (Addendum)
You have diverticulosis but no other abnormalities - no polyps or cancer!  Prep was great also.  Next routine colonoscopy in 2024.  I appreciate the opportunity to care for you. Iva Boop, MD, FACG   YOU HAD AN ENDOSCOPIC PROCEDURE TODAY AT THE [facility]: Refer to the procedure report that was given to you for any specific questions about what was found during the examination.  If the procedure report does not answer your questions, please call your gastroenterologist to clarify.  If you requested that your care partner not be given the details of your procedure findings, then the procedure report has been included in a sealed envelope for you to review at your convenience later.  YOU SHOULD EXPECT: Some feelings of bloating in the abdomen. Passage of more gas than usual.  Walking can help get rid of the air that was put into your GI tract during the procedure and reduce the bloating. If you had a lower endoscopy (such as a colonoscopy or flexible sigmoidoscopy) you may notice spotting of blood in your stool or on the toilet paper. If you underwent a bowel prep for your procedure, then you may not have a normal bowel movement for a few days.  DIET: Your first meal following the procedure should be a light meal and then it is ok to progress to your normal diet.  A half-sandwich or bowl of soup is an example of a good first meal.  Heavy or fried foods are harder to digest and may make you feel nauseous or bloated.  Likewise meals heavy in dairy and vegetables can cause extra gas to form and this can also increase the bloating.  Drink plenty of fluids but you should avoid alcoholic beverages for 24 hours.  ACTIVITY: Your care partner should take you home directly after the procedure.  You should plan to take it easy, moving slowly for the rest of the day.  You can resume normal activity the day after the procedure however you should NOT DRIVE or use heavy machinery for 24 hours  (because of the sedation medicines used during the test).    SYMPTOMS TO REPORT IMMEDIATELY: A gastroenterologist can be reached at any hour.  During normal business hours, 8:30 AM to 5:00 PM Monday through Friday, call [phone].  After hours and on weekends, please call the GI answering service at [phone] who will take a message and have the physician on call contact you.   Following lower endoscopy (colonoscopy or flexible sigmoidoscopy):  Excessive amounts of blood in the stool  Significant tenderness or worsening of abdominal pains  Swelling of the abdomen that is new, acute  Fever of 100F or higher   FOLLOW UP:  Our staff will call the home number listed on your records the next business day following your procedure to check on you and address any questions or concerns that you may have at that time regarding the information given to you following your procedure. This is a courtesy call and so if there is no answer at the home number and we have not heard from you through the emergency physician on call, we will assume that you have returned to your regular daily activities without incident.  SIGNATURES/CONFIDENTIALITY: You and/or your care partner have signed paperwork which will be entered into your electronic medical record.  These signatures attest to the fact that that the information above on your After Visit Summary has been reviewed and is understood.  Full  responsibility of the confidentiality of this discharge information lies with you and/or your care-partner.  Please continue your normal medications   Please read handout about diverticulosis  Follow up in 10 years

## 2012-09-05 ENCOUNTER — Telehealth: Payer: Self-pay | Admitting: *Deleted

## 2012-09-05 NOTE — Telephone Encounter (Signed)
  Follow up Call-  Call back number 09/04/2012  Post procedure Call Back phone  # [phone]  Permission to leave phone message Yes     Patient questions:  Message left to call if necessary.

## 2012-09-24 ENCOUNTER — Other Ambulatory Visit: Payer: Self-pay | Admitting: Internal Medicine

## 2012-09-24 NOTE — Telephone Encounter (Signed)
Refill done.  

## 2012-09-30 ENCOUNTER — Ambulatory Visit (INDEPENDENT_AMBULATORY_CARE_PROVIDER_SITE_OTHER): Payer: BC Managed Care – PPO | Admitting: Internal Medicine

## 2012-09-30 ENCOUNTER — Encounter: Payer: Self-pay | Admitting: Internal Medicine

## 2012-09-30 VITALS — BP 120/65 | HR 64 | Temp 98.2°F | Wt 259.0 lb

## 2012-09-30 DIAGNOSIS — F329 Major depressive disorder, single episode, unspecified: Secondary | ICD-10-CM

## 2012-09-30 DIAGNOSIS — E039 Hypothyroidism, unspecified: Secondary | ICD-10-CM

## 2012-09-30 DIAGNOSIS — E119 Type 2 diabetes mellitus without complications: Secondary | ICD-10-CM

## 2012-09-30 DIAGNOSIS — I10 Essential (primary) hypertension: Secondary | ICD-10-CM

## 2012-09-30 NOTE — Assessment & Plan Note (Signed)
Well-controlled,  check a BMP 

## 2012-09-30 NOTE — Assessment & Plan Note (Addendum)
Last A1c showed excellent control, occasionally has a low sugar. Plan:  Decrease glimeperide from 4 mg in the morning to 2 mg w/ the biggest meal of the day which is lunch. Check a A1c

## 2012-09-30 NOTE — Assessment & Plan Note (Signed)
Controlled with current meds, on chronic alprazolam, needs a UDS RF prn

## 2012-09-30 NOTE — Progress Notes (Signed)
  Subjective:    Patient ID: [name], female    DOB: [birth year], 62 y.o.   MRN: [id number]  HPI Routine office visit Diabetes, good medication compliance, ambulatory blood pressures usually between 120, 150. Occasionally drop in the 50s usually if she is very active physically doing yard work. Hypothyroidism, good medication compliance Depression, on Zoloft and Xanax, symptoms well-controlled  Past Medical History  Diagnosis Date  . Goiter     h/o  . Hypothyroidism     w/ h/o goiter  . Morbid obesity   . Sleep apnea     on CPAP  . Dyslipidemia   . Hypertension   . Osteoarthritis   . Depression   . Allergy     occasional seasonal  . Hyperlipidemia   . Diabetes mellitus     has diarrhea from medicine   Past Surgical History  Procedure Laterality Date  . Appendectomy    . Orthopedic surgery      multiple procedures  . Total knee arthroplasty  08/05/07    right  . Colonoscopy     History   Social History  . Marital Status: Married    Spouse Name: N/A    Number of Children: 1  . Years of Education: N/A   Occupational History  . logistics    Social History Main Topics  . Smoking status: Former Smoker -- 0.50 packs/day for 10 years    Types: Cigarettes    Quit date: 02/28/1980  . Smokeless tobacco: Never Used  . Alcohol Use: Yes     Comment: occasionally  . Drug Use: No  . Sexually Active: Not on file   Other Topics Concern  . Not on file   Social History Narrative   1 child: lost child 01/2010      Review of Systems Denies chest pain or shortness or breath No nausea, vomiting. Occasional diarrhea which is nothing new. No history of syncope.       Objective:   Physical Exam BP 120/65  Pulse 64  Temp(Src) 98.2 F (36.8 C) (Oral)  Wt 259 lb (117.482 kg)  BMI 44.44 kg/m2  SpO2 97%  General -- alert, well-developed, NAD  Lungs -- normal respiratory effort, no intercostal retractions, no accessory muscle use, and normal breath sounds.    Heart-- normal rate, regular rhythm, no murmur, and no gallop.   Extremities-- no pretibial edema bilaterally Psych-- Cognition and judgment appear intact. Alert and cooperative with normal attention span and concentration.  not anxious appearing and not depressed appearing.       Assessment & Plan:

## 2012-09-30 NOTE — Patient Instructions (Addendum)
Next visit in 4 months glimeperide 4 mg---->  half tablet before lunch

## 2012-09-30 NOTE — Assessment & Plan Note (Signed)
Good compliance with medications, last TSH normal,   no change

## 2012-10-01 LAB — BASIC METABOLIC PANEL
BUN: 35 mg/dL — ABNORMAL HIGH (ref 6–23)
CO2: 25 mEq/L (ref 19–32)
Calcium: 9.3 mg/dL (ref 8.4–10.5)
Creatinine, Ser: 1.3 mg/dL — ABNORMAL HIGH (ref 0.4–1.2)
Glucose, Bld: 98 mg/dL (ref 70–99)

## 2012-11-06 ENCOUNTER — Other Ambulatory Visit: Payer: Self-pay | Admitting: Internal Medicine

## 2012-11-06 DIAGNOSIS — F32A Depression, unspecified: Secondary | ICD-10-CM

## 2012-11-06 DIAGNOSIS — F329 Major depressive disorder, single episode, unspecified: Secondary | ICD-10-CM

## 2012-11-06 NOTE — Telephone Encounter (Signed)
Rf for zoloft sent to [facility] in [facility]

## 2012-11-08 ENCOUNTER — Encounter: Payer: Self-pay | Admitting: Internal Medicine

## 2012-11-20 ENCOUNTER — Encounter: Payer: Self-pay | Admitting: Pulmonary Disease

## 2012-11-20 ENCOUNTER — Ambulatory Visit (INDEPENDENT_AMBULATORY_CARE_PROVIDER_SITE_OTHER): Payer: BC Managed Care – PPO | Admitting: Pulmonary Disease

## 2012-11-20 VITALS — BP 128/64 | HR 51 | Temp 97.9°F | Ht 64.0 in | Wt 257.0 lb

## 2012-11-20 DIAGNOSIS — G4733 Obstructive sleep apnea (adult) (pediatric): Secondary | ICD-10-CM

## 2012-11-20 NOTE — Patient Instructions (Addendum)
Continue with your weight loss.  You are doing great. Keep up with mask changes and supplies. followup with me in one year.

## 2012-11-20 NOTE — Progress Notes (Signed)
  Subjective:    Patient ID: [name], female    DOB: Sep 03, 1950, 62 y.o.   MRN: [id number]  HPI The patient comes in today for followup of her known obstructive sleep apnea.  She is wearing CPAP compliantly, and has had no issues with her mask fit or pressure.  She feels that she is sleeping well, and is satisfied with her daytime alertness.  She has lost another 12 pounds since last visit, and I congratulated her on this.   Review of Systems  Constitutional: Negative for fever and unexpected weight change.  HENT: Negative for ear pain, nosebleeds, congestion, sore throat, rhinorrhea, sneezing, trouble swallowing, dental problem, postnasal drip and sinus pressure.   Eyes: Negative for redness and itching.  Respiratory: Negative for cough, chest tightness, shortness of breath and wheezing.   Cardiovascular: Negative for palpitations and leg swelling.  Gastrointestinal: Negative for nausea and vomiting.  Genitourinary: Negative for dysuria.  Musculoskeletal: Negative for joint swelling.  Skin: Negative for rash.  Neurological: Negative for headaches.  Hematological: Does not bruise/bleed easily.  Psychiatric/Behavioral: Negative for dysphoric mood. The patient is not nervous/anxious.        Objective:   Physical Exam Ow female in nad Nose without purulence or discharge noted. No skin breakdown or pressure necrosis from the cpap mask. LE with mild edema, no cyanosis Alert and oriented, moves all 4.        Assessment & Plan:

## 2012-11-20 NOTE — Assessment & Plan Note (Signed)
The pt is doing well with cpap, and has continued to slowly lose weight.  I have encouraged her to continue, and to keep up with mask cushion changes and supplies. I will see her back in one year if doing well.

## 2013-02-03 ENCOUNTER — Encounter: Payer: Self-pay | Admitting: Internal Medicine

## 2013-02-03 ENCOUNTER — Ambulatory Visit (INDEPENDENT_AMBULATORY_CARE_PROVIDER_SITE_OTHER): Payer: BC Managed Care – PPO | Admitting: Internal Medicine

## 2013-02-03 VITALS — BP 108/67 | HR 61 | Temp 97.8°F | Ht 63.9 in | Wt 261.0 lb

## 2013-02-03 DIAGNOSIS — E119 Type 2 diabetes mellitus without complications: Secondary | ICD-10-CM

## 2013-02-03 DIAGNOSIS — F329 Major depressive disorder, single episode, unspecified: Secondary | ICD-10-CM

## 2013-02-03 DIAGNOSIS — Z0000 Encounter for general adult medical examination without abnormal findings: Secondary | ICD-10-CM

## 2013-02-03 LAB — HEMOGLOBIN A1C: Hgb A1c MFr Bld: 6.8 % — ABNORMAL HIGH (ref 4.6–6.5)

## 2013-02-03 LAB — TSH: TSH: 0.95 u[IU]/mL (ref 0.35–5.50)

## 2013-02-03 LAB — COMPREHENSIVE METABOLIC PANEL
ALT: 14 U/L (ref 0–35)
AST: 15 U/L (ref 0–37)
Alkaline Phosphatase: 59 U/L (ref 39–117)
Chloride: 107 mEq/L (ref 96–112)
Creatinine, Ser: 1.3 mg/dL — ABNORMAL HIGH (ref 0.4–1.2)
Total Bilirubin: 0.7 mg/dL (ref 0.3–1.2)

## 2013-02-03 LAB — CBC WITH DIFFERENTIAL/PLATELET
Basophils Absolute: 0 10*3/uL (ref 0.0–0.1)
Eosinophils Absolute: 0.2 10*3/uL (ref 0.0–0.7)
HCT: 36.9 % (ref 36.0–46.0)
Hemoglobin: 12.4 g/dL (ref 12.0–15.0)
Lymphs Abs: 2 10*3/uL (ref 0.7–4.0)
MCHC: 33.5 g/dL (ref 30.0–36.0)
MCV: 82.8 fl (ref 78.0–100.0)
Monocytes Relative: 7.9 % (ref 3.0–12.0)
Neutro Abs: 4.7 10*3/uL (ref 1.4–7.7)
RDW: 16.2 % — ABNORMAL HIGH (ref 11.5–14.6)

## 2013-02-03 LAB — LIPID PANEL
Triglycerides: 189 mg/dL — ABNORMAL HIGH (ref 0.0–149.0)
VLDL: 37.8 mg/dL (ref 0.0–40.0)

## 2013-02-03 NOTE — Progress Notes (Signed)
Subjective:    Patient ID: [name], female    DOB: 1950-04-01, 62 y.o.   MRN: [id number]  HPI CPX  Past Medical History  Diagnosis Date  . Goiter     h/o  . Hypothyroidism     w/ h/o goiter  . Morbid obesity   . Sleep apnea     on CPAP  . Dyslipidemia   . Hypertension   . Osteoarthritis   . Depression   . Allergy     occasional seasonal  . Hyperlipidemia   . Diabetes mellitus     has diarrhea from medicine   Past Surgical History  Procedure Laterality Date  . Appendectomy    . Orthopedic surgery      multiple procedures  . Total knee arthroplasty  08/05/07    right  . Colonoscopy     History   Social History  . Marital Status: Married    Spouse Name: N/A    Number of Children: 1  . Years of Education: N/A   Occupational History  . logistics    Social History Main Topics  . Smoking status: Former Smoker -- 0.50 packs/day for 10 years    Types: Cigarettes    Quit date: 02/28/1980  . Smokeless tobacco: Never Used  . Alcohol Use: No     Comment: occasionally  . Drug Use: No  . Sexual Activity: Not on file   Other Topics Concern  . Not on file   Social History Narrative   1 child: lost child 01/2010     Husband disable    Family History  Problem Relation Age of Onset  . Coronary artery disease Father     F MI age 23, 45 at age 58  . Hypertension Father   . Diabetes Mother     M , brother amd sister  . Lung cancer Mother     around the lungs  . Stomach cancer Brother   . Esophageal cancer Brother   . Colon cancer Neg Hx   . Breast cancer Neg Hx   . Rectal cancer Neg Hx     Review of Systems Diet-- regular  Exercise-- active at home  No  CP, SOB; lower extremity edema in the afternoon Some L knee pain, plans to see Dr Charlann Boxer Denies  nausea, vomiting; some diarrhea depending on diet Denies  blood in the stools (-) cough, sputum production, (-) wheezing, chest congestion No dysuria, gross hematuria, difficulty urinating          Objective:   Physical Exam  BP 108/67  Pulse 61  Temp(Src) 97.8 F (36.6 C)  Ht 5' 3.9" (1.623 m)  Wt 261 lb (118.389 kg)  BMI 44.94 kg/m2  SpO2 98% General -- alert, well-developed, NAD.  Neck --no thyromegaly , normal carotid pulse Lungs -- normal respiratory effort, no intercostal retractions, no accessory muscle use, and normal breath sounds.  Heart-- normal rate, regular rhythm, no murmur.  Abdomen-- Not distended, good bowel sounds,soft, non-tender. DIABETIC FEET EXAM: No lower extremity edema Normal pedal pulses bilaterally Skin normal, nails : some are thick, few calluses Pinprick examination of the feet normal. Neurologic--  alert & oriented X3. Speech normal, gait normal, strength normal in all extremities.  Psych-- Cognition and judgment appear intact. Cooperative with normal attention span and concentration. No anxious appearing , no depressed appearing.      Assessment & Plan:   Chronic medical issues: Diabetes, hypertension, hypothyroidism: Good compliance of medication,  labs

## 2013-02-03 NOTE — Progress Notes (Signed)
Pre visit review using our clinic review tool, if applicable. No additional management support is needed unless otherwise documented below in the visit note. 

## 2013-02-03 NOTE — Assessment & Plan Note (Addendum)
Td 2012 Had a flu shot already Pneumonia shot-- 2011 Shingles shot 2013  Cscope 2004: melanosis coli, no polyp----> Cscopoe again 7- 2014 (Dr Leone Payor), normal next 10 years Saw  Gyn in ~ 05-2012 04-2012 mammogram (-) DEXA normal, 12/2008, recheck next year on calcium and vitamin D Labs Diet-exercise discussed

## 2013-02-03 NOTE — Assessment & Plan Note (Signed)
UDS 8-14 low risk

## 2013-02-03 NOTE — Assessment & Plan Note (Signed)
Was recommended to decrease glymeperide to 2  mg but still taking 4 mg. ambulatory blood sugars range from 108-130, ambulatory BPs 110/70  . Occasionally has low blood sugar symptoms, does carry sugar w/ her  Feet care discussed Has not seen eye doctor yet ($$) Labs

## 2013-02-03 NOTE — Patient Instructions (Signed)
Get your blood work before you leave  Next visit for a  follow up (30 minutes)  regards diabetes hypertension  , no fasting, in 4-5 months  Please make an appointment    Diabetes and Foot Care Diabetes may cause you to have problems because of poor blood supply (circulation) to your feet and legs. This may cause the skin on your feet to become thinner, break easier, and heal more slowly. Your skin may become dry, and the skin may peel and crack. You may also have nerve damage in your legs and feet causing decreased feeling in them. You may not notice minor injuries to your feet that could lead to infections or more serious problems. Taking care of your feet is one of the most important things you can do for yourself.  HOME CARE INSTRUCTIONS  Wear shoes at all times, even in the house. Do not go barefoot. Bare feet are easily injured.  Check your feet daily for blisters, cuts, and redness. If you cannot see the bottom of your feet, use a mirror or ask someone for help.  Wash your feet with warm water (do not use hot water) and mild soap. Then pat your feet and the areas between your toes until they are completely dry. Do not soak your feet as this can dry your skin.  Apply a moisturizing lotion or petroleum jelly (that does not contain alcohol and is unscented) to the skin on your feet and to dry, brittle toenails. Do not apply lotion between your toes.  Trim your toenails straight across. Do not dig under them or around the cuticle. File the edges of your nails with an emery board or nail file.  Do not cut corns or calluses or try to remove them with medicine.  Wear clean socks or stockings every day. Make sure they are not too tight. Do not wear knee-high stockings since they may decrease blood flow to your legs.  Wear shoes that fit properly and have enough cushioning. To break in new shoes, wear them for just a few hours a day. This prevents you from injuring your feet. Always look in your  shoes before you put them on to be sure there are no objects inside.  Do not cross your legs. This may decrease the blood flow to your feet.  If you find a minor scrape, cut, or break in the skin on your feet, keep it and the skin around it clean and dry. These areas may be cleansed with mild soap and water. Do not cleanse the area with peroxide, alcohol, or iodine.  When you remove an adhesive bandage, be sure not to damage the skin around it.  If you have a wound, look at it several times a day to make sure it is healing.  Do not use heating pads or hot water bottles. They may burn your skin. If you have lost feeling in your feet or legs, you may not know it is happening until it is too late.  Make sure your health care provider performs a complete foot exam at least annually or more often if you have foot problems. Report any cuts, sores, or bruises to your health care provider immediately. SEEK MEDICAL CARE IF:   You have an injury that is not healing.  You have cuts or breaks in the skin.  You have an ingrown nail.  You notice redness on your legs or feet.  You feel burning or tingling in your legs or  feet.  You have pain or cramps in your legs and feet.  Your legs or feet are numb.  Your feet always feel cold. SEEK IMMEDIATE MEDICAL CARE IF:   There is increasing redness, swelling, or pain in or around a wound.  There is a red line that goes up your leg.  Pus is coming from a wound.  You develop a fever or as directed by your health care provider.  You notice a bad smell coming from an ulcer or wound. Document Released: 02/11/2000 Document Revised: 10/16/2012 Document Reviewed: 07/23/2012 [facility] Patient Information 2014 [place], [place].

## 2013-03-17 ENCOUNTER — Other Ambulatory Visit: Payer: Self-pay | Admitting: Internal Medicine

## 2013-03-17 NOTE — Telephone Encounter (Signed)
Benazepril refilled per protocol. JG//CMA 

## 2013-03-26 ENCOUNTER — Other Ambulatory Visit: Payer: Self-pay | Admitting: Internal Medicine

## 2013-03-26 NOTE — Telephone Encounter (Signed)
Glimepiride, HCTZ, Metoprolol, and Synthroid refilled per protocol. JG//CMA

## 2013-05-13 ENCOUNTER — Emergency Department (HOSPITAL_COMMUNITY)
Admission: EM | Admit: 2013-05-13 | Discharge: 2013-05-13 | Disposition: A | Discharge status: Home / Self Care | Payer: BC Managed Care – PPO | Attending: Emergency Medicine | Admitting: Emergency Medicine

## 2013-05-13 ENCOUNTER — Emergency Department (HOSPITAL_COMMUNITY): Payer: BC Managed Care – PPO

## 2013-05-13 ENCOUNTER — Encounter (HOSPITAL_COMMUNITY): Payer: Self-pay | Admitting: Emergency Medicine

## 2013-05-13 DIAGNOSIS — G4733 Obstructive sleep apnea (adult) (pediatric): Secondary | ICD-10-CM | POA: Insufficient documentation

## 2013-05-13 DIAGNOSIS — Z7982 Long term (current) use of aspirin: Secondary | ICD-10-CM | POA: Insufficient documentation

## 2013-05-13 DIAGNOSIS — M1712 Unilateral primary osteoarthritis, left knee: Secondary | ICD-10-CM

## 2013-05-13 DIAGNOSIS — E785 Hyperlipidemia, unspecified: Secondary | ICD-10-CM | POA: Insufficient documentation

## 2013-05-13 DIAGNOSIS — F329 Major depressive disorder, single episode, unspecified: Secondary | ICD-10-CM | POA: Insufficient documentation

## 2013-05-13 DIAGNOSIS — M25469 Effusion, unspecified knee: Secondary | ICD-10-CM | POA: Insufficient documentation

## 2013-05-13 DIAGNOSIS — E039 Hypothyroidism, unspecified: Secondary | ICD-10-CM | POA: Insufficient documentation

## 2013-05-13 DIAGNOSIS — M1710 Unilateral primary osteoarthritis, unspecified knee: Secondary | ICD-10-CM | POA: Insufficient documentation

## 2013-05-13 DIAGNOSIS — F3289 Other specified depressive episodes: Secondary | ICD-10-CM | POA: Insufficient documentation

## 2013-05-13 DIAGNOSIS — E119 Type 2 diabetes mellitus without complications: Secondary | ICD-10-CM | POA: Insufficient documentation

## 2013-05-13 DIAGNOSIS — Z87891 Personal history of nicotine dependence: Secondary | ICD-10-CM | POA: Insufficient documentation

## 2013-05-13 DIAGNOSIS — I10 Essential (primary) hypertension: Secondary | ICD-10-CM | POA: Insufficient documentation

## 2013-05-13 DIAGNOSIS — Z79899 Other long term (current) drug therapy: Secondary | ICD-10-CM | POA: Insufficient documentation

## 2013-05-13 DIAGNOSIS — IMO0002 Reserved for concepts with insufficient information to code with codable children: Secondary | ICD-10-CM | POA: Insufficient documentation

## 2013-05-13 MED ORDER — HYDROCODONE-ACETAMINOPHEN 5-325 MG PO TABS: 2.0000 | ORAL_TABLET | ORAL | Status: DC | PRN

## 2013-05-13 MED ORDER — HYDROCODONE-ACETAMINOPHEN 5-325 MG PO TABS
2.0000 | ORAL_TABLET | Freq: Once | ORAL | Status: AC
  Administered 2013-05-13: 2 via ORAL
  Filled 2013-05-13: qty 2

## 2013-05-13 NOTE — ED Provider Notes (Signed)
Medical screening examination/treatment/procedure(s) were performed by non-physician practitioner and as supervising physician I was immediately available for consultation/collaboration.  Leota Jacobsen, MD 05/13/13 [phone]

## 2013-05-13 NOTE — Discharge Instructions (Signed)
Take Vicodin as needed for pain. Follow up with Dr. Alvan Dame as scheduled. Refer to attached documents for more information.

## 2013-05-13 NOTE — ED Provider Notes (Signed)
CSN: [id number]     Arrival date & time 05/13/13  1144 History  This chart was scribed for non-physician practitioner working with Leota Jacobsen, MD by Stacy Gardner, ED scribe. This patient was seen in room WTR5/WTR5 and the patient's care was started at 1:13 PM.   First MD Initiated Contact with Patient 05/13/13 1216     Chief Complaint  Patient presents with  . Knee Pain     (Consider location/radiation/quality/duration/timing/severity/associated sxs/prior Treatment) HPI HPI Comments: [name] is a 63 y.o. female who presents to the Emergency Department complaining of gradually worsening L knee pain that has been much worse three days. She has tried elevating her and Aleve. The pain is described as burning and sharp. Due to pain pt is unable to ambulate or bear weight. She has an appointement with Dr. Alvan Dame in three days and she needs to have a referral from the ED to be seen sooner. She has a hx of bilateral knee pain ( the L knee pain has been worse for the past week).  Pt has a pain contract.  Past Medical History  Diagnosis Date  . Goiter     h/o  . Hypothyroidism     w/ h/o goiter  . Morbid obesity   . Sleep apnea     on CPAP  . Dyslipidemia   . Hypertension   . Osteoarthritis   . Depression   . Allergy     occasional seasonal  . Hyperlipidemia   . Diabetes mellitus     has diarrhea from medicine   Past Surgical History  Procedure Laterality Date  . Appendectomy    . Orthopedic surgery      multiple procedures  . Total knee arthroplasty  08/05/07    right  . Colonoscopy     Family History  Problem Relation Age of Onset  . Coronary artery disease Father     F MI age 74, 47 at age 13  . Hypertension Father   . Diabetes Mother     M , brother amd sister  . Lung cancer Mother     around the lungs  . Stomach cancer Brother   . Esophageal cancer Brother   . Colon cancer Neg Hx   . Breast cancer Neg Hx   . Rectal cancer Neg Hx    History   Substance Use Topics  . Smoking status: Former Smoker -- 0.50 packs/day for 10 years    Types: Cigarettes    Quit date: 02/28/1980  . Smokeless tobacco: Never Used  . Alcohol Use: Yes     Comment: occasionally   OB History   Grav Para Term Preterm Abortions TAB SAB Ect Mult Living                 Review of Systems  Musculoskeletal: Positive for arthralgias, gait problem, joint swelling and myalgias.       Left knee pain       Allergies  Review of patient's allergies indicates no known allergies.  Home Medications   Current Outpatient Rx  Name  Route  Sig  Dispense  Refill  . ALPRAZolam (XANAX) 0.5 MG tablet   Oral   Take 0.5 mg by mouth 3 (three) times daily as needed for anxiety.         Marland Kitchen aspirin EC 81 MG tablet   Oral   Take 81 mg by mouth daily.         . benazepril (LOTENSIN) 40  MG tablet   Oral   Take 40 mg by mouth daily.         . Calcium Carbonate-Vitamin D (CALCIUM + D PO)   Oral   Take 1 tablet by mouth daily.          Marland Kitchen glimepiride (AMARYL) 4 MG tablet   Oral   Take 4 mg by mouth daily.          . hydrochlorothiazide (HYDRODIURIL) 25 MG tablet   Oral   Take 25 mg by mouth daily.         Marland Kitchen levothyroxine (SYNTHROID, LEVOTHROID) 150 MCG tablet   Oral   Take 150 mcg by mouth daily before breakfast.         . metoprolol (LOPRESSOR) 50 MG tablet   Oral   Take 50 mg by mouth 2 (two) times daily.         . naproxen sodium (ANAPROX) 220 MG tablet   Oral   Take 440 mg by mouth 2 (two) times daily as needed (Pain).         . rosuvastatin (CRESTOR) 10 MG tablet   Oral   Take 1 tablet (10 mg total) by mouth daily.   90 tablet   2   . sertraline (ZOLOFT) 50 MG tablet   Oral   Take 150 mg by mouth at bedtime.         . sitaGLIPtan-metformin (JANUMET) 50-1000 MG per tablet   Oral   Take 1 tablet by mouth 2 (two) times daily with a meal.   180 tablet   2   . glucose blood (ONE TOUCH ULTRA TEST) test strip      Check BS  daily, dx 250.00   102 each   3   . Lancets (ONETOUCH ULTRASOFT) lancets   Other   1 each by Other route as needed. Use as instructed           BP 126/60  Pulse 51  Temp(Src) 98.1 F (36.7 C) (Oral)  Resp 18  Ht 5\' 4"  (1.626 m)  Wt 265 lb (120.203 kg)  BMI 45.46 kg/m2  SpO2 96% Physical Exam  Nursing note and vitals reviewed. Constitutional: She is oriented to person, place, and time. She appears well-developed and well-nourished. No distress.  HENT:  Head: Normocephalic and atraumatic.  Eyes: EOM are normal. Pupils are equal, round, and reactive to light.  Neck: Normal range of motion. Neck supple. No tracheal deviation present.  Cardiovascular: Normal rate.   Pulmonary/Chest: Effort normal. No respiratory distress.  Abdominal: Soft. She exhibits no distension.  Musculoskeletal: Normal range of motion. She exhibits tenderness. She exhibits no edema.  Generalized tenderness to palpation to L knee No edema.  Neurological: She is alert and oriented to person, place, and time.  Skin: Skin is warm and dry.  Psychiatric: She has a normal mood and affect. Her behavior is normal.    ED Course  Procedures (including critical care time) DIAGNOSTIC STUDIES: Oxygen Saturation is 96% on room air, normal by my interpretation.    COORDINATION OF CARE:  1:03 PM Discussed course of care with pt . Pt understands and agrees.    Labs Review Labs Reviewed - No data to display Imaging Review Dg Knee Complete 4 Views Left  05/13/2013   CLINICAL DATA:  Knee pain without injury.  EXAM: LEFT KNEE - COMPLETE 4+ VIEW  COMPARISON:  None.  FINDINGS: The knee is located. There is severe osteoarthritis with prominent osteophyte formation  involving the patellofemoral compartments. There is moderate osteoarthritis of the medial compartment evidenced by joint space narrowing and osteophyte formation. There is a milder degree of osteophyte formation of the lateral compartment, with a preserved joint  space laterally.  There is a small suprapatellar joint effusion.  6 mm soft tissue calcification versus loose body of the anterior and inferior knee. No acute bony abnormality is identified.  IMPRESSION: Tricompartmental osteoarthritis, most severe in the patellofemoral compartment.  Small suprapatellar joint effusion.   Electronically Signed   By: Curlene Dolphin M.D.   On: 05/13/2013 12:48     EKG Interpretation None      MDM   Final diagnoses:  Arthritis of left knee    3:07 PM Patient's xray show tricompartmental osteoarthritis. Patient has no acute injuries or fractures. Patient will have Vicodin here. Patient does not want crutches because "she has them at home." Patient has an appointment this week with Dr. Alvan Dame for further evaluation.     Alvina Chou, PA-C 05/13/13 [phone]

## 2013-05-13 NOTE — ED Notes (Signed)
Patient states she has been having pain in left knee x 3 days. Patient states he has been unable to walk or bear weight on her left knee. Patient states she was unable to see Dr. Alvan Dame for several days.

## 2013-05-23 ENCOUNTER — Other Ambulatory Visit: Payer: Self-pay | Admitting: Internal Medicine

## 2013-05-29 ENCOUNTER — Encounter (HOSPITAL_COMMUNITY): Payer: Self-pay | Admitting: Pharmacy Technician

## 2013-06-02 ENCOUNTER — Encounter: Payer: Self-pay | Admitting: Internal Medicine

## 2013-06-02 ENCOUNTER — Ambulatory Visit (INDEPENDENT_AMBULATORY_CARE_PROVIDER_SITE_OTHER): Payer: BC Managed Care – PPO | Admitting: Internal Medicine

## 2013-06-02 VITALS — BP 105/63 | HR 60 | Temp 98.3°F | Ht 64.2 in | Wt 260.0 lb

## 2013-06-02 DIAGNOSIS — F329 Major depressive disorder, single episode, unspecified: Secondary | ICD-10-CM

## 2013-06-02 DIAGNOSIS — F3289 Other specified depressive episodes: Secondary | ICD-10-CM

## 2013-06-02 DIAGNOSIS — Z01818 Encounter for other preprocedural examination: Secondary | ICD-10-CM

## 2013-06-02 MED ORDER — ALPRAZOLAM 0.5 MG PO TABS: 0.5000 mg | ORAL_TABLET | Freq: Three times a day (TID) | ORAL | Status: DC | PRN

## 2013-06-02 NOTE — Progress Notes (Signed)
Subjective:    Patient ID: [name], female    DOB: May 19, 1950, 63 y.o.   MRN: [id number]  DOS:  06/02/2013 Type of  visit: preop clearence Patient to have a left total knee replacement in about 10 days. She has + family history heart disease, she is diabetic. Also, lost brother last week, obviously affected emotionally. Needs a refill of Xanax, denies the need of any   further intervention.      ROS No chest pain or shortness of breath. Occasionally has lower extremity edema only on the end of the day. No orthopnea. Able to go upstairs to the 2-3th floor limited only by pain. Denies nausea, vomiting, diarrhea or blood in the stools. No cough or wheezing. No headache or dizziness.    Past Medical History  Diagnosis Date  . Goiter     h/o  . Hypothyroidism     w/ h/o goiter  . Morbid obesity   . Sleep apnea     on CPAP  . Dyslipidemia   . Hypertension   . Osteoarthritis   . Depression   . Allergy     occasional seasonal  . Hyperlipidemia   . Diabetes mellitus     has diarrhea from medicine    Past Surgical History  Procedure Laterality Date  . Appendectomy    . Orthopedic surgery      multiple procedures  . Total knee arthroplasty  08/05/07    right  . Colonoscopy      History   Social History  . Marital Status: Married    Spouse Name: N/A    Number of Children: 1  . Years of Education: N/A   Occupational History  . logistics    Social History Main Topics  . Smoking status: Former Smoker -- 0.50 packs/day for 10 years    Types: Cigarettes    Quit date: 02/28/1980  . Smokeless tobacco: Never Used  . Alcohol Use: Yes     Comment: occasionally  . Drug Use: No  . Sexual Activity: Not on file   Other Topics Concern  . Not on file   Social History Narrative   1 child: lost child 01/2010     Husband disable         Medication List       This list is accurate as of: 06/02/13  3:12 PM.  Always use your most recent med list.                 ALPRAZolam 0.5 MG tablet  Commonly known as:  XANAX  Take 0.5 mg by mouth 3 (three) times daily as needed for anxiety.     aspirin EC 81 MG tablet  Take 81 mg by mouth daily.     benazepril 40 MG tablet  Commonly known as:  LOTENSIN  Take 40 mg by mouth every morning.     CALCIUM + D PO  Take 1 tablet by mouth daily.     cetirizine 10 MG tablet  Commonly known as:  ZYRTEC  Take 10 mg by mouth daily.     glimepiride 4 MG tablet  Commonly known as:  AMARYL  Take 2 mg by mouth daily.     hydrochlorothiazide 25 MG tablet  Commonly known as:  HYDRODIURIL  Take 25 mg by mouth every morning.     levothyroxine 150 MCG tablet  Commonly known as:  SYNTHROID, LEVOTHROID  Take 150 mcg by mouth daily before breakfast.  metoprolol 50 MG tablet  Commonly known as:  LOPRESSOR  Take 50 mg by mouth 2 (two) times daily.     naproxen sodium 220 MG tablet  Commonly known as:  ANAPROX  Take 440 mg by mouth 2 (two) times daily as needed (Pain).     rosuvastatin 10 MG tablet  Commonly known as:  CRESTOR  Take 10 mg by mouth every evening.     sertraline 50 MG tablet  Commonly known as:  ZOLOFT  Take 150 mg by mouth at bedtime.     sitaGLIPtin-metformin 50-1000 MG per tablet  Commonly known as:  JANUMET  Take 1 tablet by mouth 2 (two) times daily with a meal.           Objective:   Physical Exam BP 105/63  Pulse 60  Temp(Src) 98.3 F (36.8 C)  Ht 5' 4.2" (1.631 m)  Wt 260 lb (117.935 kg)  BMI 44.33 kg/m2  SpO2 96% General -- alert, well-developed, NAD.  Neck --no  JVD at 45  Lungs -- normal respiratory effort, no intercostal retractions, no accessory muscle use, and normal breath sounds.  Heart-- normal rate, regular rhythm, no murmur.  Abdomen-- Not distended, good bowel sounds,soft, non-tender. Extremities-- no pretibial edema bilaterally ; calves symmetric Neurologic--  alert & oriented X3. Speech normal, gait normal, strength normal in all extremities.   Psych-- Cognition and judgment appear intact. Cooperative with normal attention span and concentration. No anxious or depressed appearing.        Assessment & Plan:

## 2013-06-02 NOTE — Assessment & Plan Note (Addendum)
63 year old lady with diabetes, hypertension, high cholesterol, sleep apnea, + FH of heart disease in need of a left total knee replacement. Never had a objectivecardiovascular examination. No recent major surgeries EKG today noa cute changes  Plan:  Stress test   labs

## 2013-06-02 NOTE — Patient Instructions (Addendum)
Get your blood work before you leave     Next visit is for routine check up regards your blood sugar , blood pressure  thyroid  in 2-3 months  No need to come back fasting Please make an appointment

## 2013-06-02 NOTE — Assessment & Plan Note (Addendum)
Recently lost a brother, emotional support provided, refill Xanax.

## 2013-06-02 NOTE — Progress Notes (Signed)
Pre visit review using our clinic review tool, if applicable. No additional management support is needed unless otherwise documented below in the visit note. 

## 2013-06-03 LAB — BASIC METABOLIC PANEL
BUN: 30 mg/dL — ABNORMAL HIGH (ref 6–23)
CALCIUM: 10 mg/dL (ref 8.4–10.5)
CO2: 22 mEq/L (ref 19–32)
Chloride: 105 mEq/L (ref 96–112)
Creatinine, Ser: 1.2 mg/dL (ref 0.4–1.2)
GFR: 47.17 mL/min — AB (ref >60.00)
Glucose, Bld: 122 mg/dL — ABNORMAL HIGH (ref 70–99)
Potassium: 4.4 mEq/L (ref 3.5–5.1)
SODIUM: 138 meq/L (ref 135–145)

## 2013-06-03 LAB — CBC WITH DIFFERENTIAL/PLATELET
BASOS PCT: 0.2 % (ref 0.0–3.0)
Basophils Absolute: 0 10*3/uL (ref 0.0–0.1)
Eosinophils Absolute: 0.3 10*3/uL (ref 0.0–0.7)
Eosinophils Relative: 3.3 % (ref 0.0–5.0)
HCT: 40 % (ref 36.0–46.0)
Hemoglobin: 13.3 g/dL (ref 12.0–15.0)
LYMPHS PCT: 23.3 % (ref 12.0–46.0)
Lymphs Abs: 2 10*3/uL (ref 0.7–4.0)
MCHC: 33.4 g/dL (ref 30.0–36.0)
MCV: 85.2 fl (ref 78.0–100.0)
MONOS PCT: 6.9 % (ref 3.0–12.0)
Monocytes Absolute: 0.6 10*3/uL (ref 0.1–1.0)
Neutro Abs: 5.6 10*3/uL (ref 1.4–7.7)
Neutrophils Relative %: 66.3 % (ref 43.0–77.0)
Platelets: 328 10*3/uL (ref 150.0–400.0)
RBC: 4.7 Mil/uL (ref 3.87–5.11)
RDW: 15.2 % — ABNORMAL HIGH (ref 11.5–14.6)
WBC: 8.5 10*3/uL (ref 4.5–10.5)

## 2013-06-04 ENCOUNTER — Telehealth: Payer: Self-pay

## 2013-06-04 ENCOUNTER — Other Ambulatory Visit (HOSPITAL_COMMUNITY): Payer: Self-pay | Admitting: Orthopedic Surgery

## 2013-06-04 ENCOUNTER — Inpatient Hospital Stay (HOSPITAL_COMMUNITY): Admission: RE | Admit: 2013-06-04 | Payer: BC Managed Care – PPO | Source: Ambulatory Visit

## 2013-06-04 NOTE — Progress Notes (Signed)
ekg 06-02-13 epic Dr pax medical clearnce note dr pax 4-6-1 5 epic

## 2013-06-04 NOTE — Patient Instructions (Signed)
[name]  06/04/2013   Your procedure is scheduled on: Tuesday April 14th, 2015  Report to [facility] at 1045 AM.  Call this number if you have problems the morning of surgery [phone]   Remember:  Do not eat food :After Midnight.   clear liquids midnight night before surgery until 745 am day of surgery, then nothing by mouth.   Take these medicines the morning of surgery with A SIP OF WATER: metoprolol, levothyroxine, alprazolam                               You may not have any metal on your body including hair pins and piercings  Do not wear jewelry, make-up, lotions, powders, or deodorant.   Men may shave face and neck.  Do not bring valuables to the hospital. [place].  Contacts, dentures or bridgework may not be worn into surgery.  Leave suitcase in the car. After surgery it may be brought to your room.  For patients admitted to the hospital, checkout time is 11:00 AM the day of discharge.   Patients discharged the day of surgery will not be allowed to drive home.  Name and phone number of your driver:  Special Instructions: N/A  [facility] - Preparing for Surgery Before surgery, you can play an important role.  Because skin is not sterile, your skin needs to be as free of germs as possible.  You can reduce the number of germs on your skin by washing with CHG (chlorahexidine gluconate) soap before surgery.  CHG is an antiseptic cleaner which kills germs and bonds with the skin to continue killing germs even after washing. Please DO NOT use if you have an allergy to CHG or antibacterial soaps.  If your skin becomes reddened/irritated stop using the CHG and inform your nurse when you arrive at Short Stay. Do not shave (including legs and underarms) for at least 48 hours prior to the first CHG shower.  You may shave your face. Please follow these instructions carefully:  1.  Shower with CHG Soap the night before  surgery and the  morning of Surgery.  2.  If you choose to wash your hair, wash your hair first as usual with your  normal  shampoo.  3.  After you shampoo, rinse your hair and body thoroughly to remove the  shampoo.                           4.  Use CHG as you would any other liquid soap.  You can apply chg directly  to the skin and wash                       Gently with a scrungie or clean washcloth.  5.  Apply the CHG Soap to your body ONLY FROM THE NECK DOWN.   Do not use on open                           Wound or open sores. Avoid contact with eyes, ears mouth and genitals (private parts).                        Genitals (private parts) with your normal soap.  6.  Wash thoroughly, paying special attention to the area where your surgery  will be performed.  7.  Thoroughly rinse your body with warm water from the neck down.  8.  DO NOT shower/wash with your normal soap after using and rinsing off  the CHG Soap.                9.  Pat yourself dry with a clean towel.            10.  Wear clean pajamas.            11.  Place clean sheets on your bed the night of your first shower and do not  sleep with pets. Day of Surgery : Do not apply any lotions/deodorants the morning of surgery.  Please wear clean clothes to the hospital/surgery center.  FAILURE TO FOLLOW THESE INSTRUCTIONS MAY RESULT IN THE CANCELLATION OF YOUR SURGERY PATIENT SIGNATURE_________________________________  NURSE SIGNATURE__________________________________    CLEAR LIQUID DIET   Foods Allowed                                                                     Foods Excluded  Coffee and tea, regular and decaf                             liquids that you cannot  Plain Jell-O in any flavor                                             see through such as: Fruit ices (not with fruit pulp)                                     milk, soups, orange juice  Iced Popsicles                                    All solid  food Carbonated beverages, regular and diet                                    Cranberry, grape and apple juices Sports drinks like Gatorade Lightly seasoned clear broth or consume(fat free) Sugar, honey syrup  Sample Menu Breakfast                                Lunch                                     Supper Cranberry juice                    Beef broth  Chicken broth Jell-O                                     Grape juice                           Apple juice Coffee or tea                        Jell-O                                      Popsicle                                                Coffee or tea                        Coffee or tea Incentive Spirometer  An incentive spirometer is a tool that can help keep your lungs clear and active. This tool measures how well you are filling your lungs with each breath. Taking long deep breaths may help reverse or decrease the chance of developing breathing (pulmonary) problems (especially infection) following:  A long period of time when you are unable to move or be active. BEFORE THE PROCEDURE   If the spirometer includes an indicator to show your best effort, your nurse or respiratory therapist will set it to a desired goal.  If possible, sit up straight or lean slightly forward. Try not to slouch.  Hold the incentive spirometer in an upright position. INSTRUCTIONS FOR USE  1. Sit on the edge of your bed if possible, or sit up as far as you can in bed or on a chair. 2. Hold the incentive spirometer in an upright position. 3. Breathe out normally. 4. Place the mouthpiece in your mouth and seal your lips tightly around it. 5. Breathe in slowly and as deeply as possible, raising the piston or the ball toward the top of the column. 6. Hold your breath for 3-5 seconds or for as long as possible. Allow the piston or ball to fall to the bottom of the column. 7. Remove the mouthpiece from your mouth and breathe  out normally. 8. Rest for a few seconds and repeat Steps 1 through 7 at least 10 times every 1-2 hours when you are awake. Take your time and take a few normal breaths between deep breaths. 9. The spirometer may include an indicator to show your best effort. Use the indicator as a goal to work toward during each repetition. 10. After each set of 10 deep breaths, practice coughing to be sure your lungs are clear. If you have an incision (the cut made at the time of surgery), support your incision when coughing by placing a pillow or rolled up towels firmly against it. Once you are able to get out of bed, walk around indoors and cough well. You may stop using the incentive spirometer when instructed by your caregiver.  RISKS AND COMPLICATIONS  Take your time so you do not get dizzy or light-headed.  If you are in pain, you may need to take or  ask for pain medication before doing incentive spirometry. It is harder to take a deep breath if you are having pain. AFTER USE  Rest and breathe slowly and easily.  It can be helpful to keep track of a log of your progress. Your caregiver can provide you with a simple table to help with this. If you are using the spirometer at home, follow these instructions: [place] IF:   You are having difficultly using the spirometer.  You have trouble using the spirometer as often as instructed.  Your pain medication is not giving enough relief while using the spirometer.  You develop fever of 100.5 F (38.1 C) or higher. SEEK IMMEDIATE MEDICAL CARE IF:   You cough up bloody sputum that had not been present before.  You develop fever of 102 F (38.9 C) or greater.  You develop worsening pain at or near the incision site. MAKE SURE YOU:   Understand these instructions.  Will watch your condition.  Will get help right away if you are not doing well or get worse. Document Released: 06/26/2006 Document Revised: 05/08/2011 Document Reviewed:  08/27/2006 [facility] Patient Information 2014 [place], [place].   WHAT IS A BLOOD TRANSFUSION? Blood Transfusion Information  A transfusion is the replacement of blood or some of its parts. Blood is made up of multiple cells which provide different functions.  Red blood cells carry oxygen and are used for blood loss replacement.  White blood cells fight against infection.  Platelets control bleeding.  Plasma helps clot blood.  Other blood products are available for specialized needs, such as hemophilia or other clotting disorders. BEFORE THE TRANSFUSION  Who gives blood for transfusions?   Healthy volunteers who are fully evaluated to make sure their blood is safe. This is blood bank blood. Transfusion therapy is the safest it has ever been in the practice of medicine. Before blood is taken from a donor, a complete history is taken to make sure that person has no history of diseases nor engages in risky social behavior (examples are intravenous drug use or sexual activity with multiple partners). The donor's travel history is screened to minimize risk of transmitting infections, such as malaria. The donated blood is tested for signs of infectious diseases, such as HIV and hepatitis. The blood is then tested to be sure it is compatible with you in order to minimize the chance of a transfusion reaction. If you or a relative donates blood, this is often done in anticipation of surgery and is not appropriate for emergency situations. It takes many days to process the donated blood. RISKS AND COMPLICATIONS Although transfusion therapy is very safe and saves many lives, the main dangers of transfusion include:   Getting an infectious disease.  Developing a transfusion reaction. This is an allergic reaction to something in the blood you were given. Every precaution is taken to prevent this. The decision to have a blood transfusion has been considered carefully by your caregiver before blood is  given. Blood is not given unless the benefits outweigh the risks. AFTER THE TRANSFUSION  Right after receiving a blood transfusion, you will usually feel much better and more energetic. This is especially true if your red blood cells have gotten low (anemic). The transfusion raises the level of the red blood cells which carry oxygen, and this usually causes an energy increase.  The nurse administering the transfusion will monitor you carefully for complications. HOME CARE INSTRUCTIONS  No special instructions are needed after a transfusion.  You may find your energy is better. Speak with your caregiver about any limitations on activity for underlying diseases you may have. SEEK MEDICAL CARE IF:   Your condition is not improving after your transfusion.  You develop redness or irritation at the intravenous (IV) site. SEEK IMMEDIATE MEDICAL CARE IF:  Any of the following symptoms occur over the next 12 hours:  Shaking chills.  You have a temperature by mouth above 102 F (38.9 C), not controlled by medicine.  Chest, back, or muscle pain.  People around you feel you are not acting correctly or are confused.  Shortness of breath or difficulty breathing.  Dizziness and fainting.  You get a rash or develop hives.  You have a decrease in urine output.  Your urine turns a dark color or changes to pink, red, or brown. Any of the following symptoms occur over the next 10 days:  You have a temperature by mouth above 102 F (38.9 C), not controlled by medicine.  Shortness of breath.  Weakness after normal activity.  The white part of the eye turns yellow (jaundice).  You have a decrease in the amount of urine or are urinating less often.  Your urine turns a dark color or changes to pink, red, or brown. Document Released: 02/11/2000 Document Revised: 05/08/2011 Document Reviewed: 09/30/2007 [facility] Patient Information 2014 [place].

## 2013-06-04 NOTE — Telephone Encounter (Signed)
Spoke with Dr. Larose Kells and he stated that some tests have been ordered and he will make the surgery clearance determination once the results have been received. JG//CMA

## 2013-06-04 NOTE — Telephone Encounter (Signed)
Judeen Hammans, from [employer] called and stated for [place] to call her when Shalan's stress test is scheduled. Judeen Hammans would like to know in case she needs to cancel her surgery appointment for next week.

## 2013-06-05 ENCOUNTER — Inpatient Hospital Stay (HOSPITAL_COMMUNITY)
Admission: RE | Admit: 2013-06-05 | Discharge: 2013-06-05 | Disposition: A | Discharge status: Home / Self Care | Payer: BC Managed Care – PPO | Source: Ambulatory Visit

## 2013-06-19 NOTE — H&P (Signed)
TOTAL KNEE ADMISSION H&P  Patient is being admitted for left total knee arthroplasty.  Subjective:  Chief Complaint:    Left knee OA / pain.  HPI: [name], 63 y.o. female, has a history of pain and functional disability in the left knee due to arthritis and has failed non-surgical conservative treatments for greater than 12 weeks to includeNSAID's and/or analgesics, corticosteriod injections, use of assistive devices and activity modification.  Onset of symptoms was gradual, starting 5+ years ago with gradually worsening course since that time. The patient noted prior procedures on the knee to include  arthroplasty on the right knee per Dr. Alvan Dame about 5 years ago.  Patient currently rates pain in the left knee(s) at 10 out of 10 with activity. Patient has worsening of pain with activity and weight bearing, pain that interferes with activities of daily living, pain with passive range of motion, crepitus and joint swelling.  Patient has evidence of periarticular osteophytes and joint space narrowing by imaging studies.  There is no active infection.   Risks, benefits and expectations were discussed with the patient.  Risks including but not limited to the risk of anesthesia, blood clots, nerve damage, blood vessel damage, failure of the prosthesis, infection and up to and including death.  Patient understand the risks, benefits and expectations and wishes to proceed with surgery.   D/C Plans:   Home with HHPT/SNF  Post-op Meds:   No Rx given  Tranexamic Acid:   To be given  Decadron:    Not to be given - DM  FYI:    ASA post-op  Norco post-op  CPAP   Patient Active Problem List   Diagnosis Date Noted  . Preoperative clearance 06/02/2013  . Trigger finger 09/27/2011  . Annual physical exam 12/29/2010  . DM 01/01/2009  . VITAMIN D DEFICIENCY 01/01/2009  . OBSTRUCTIVE SLEEP APNEA 11/19/2008  . DEPRESSION 10/24/2007  . OSTEOARTHRITIS 04/24/2007  . HYPOTHYROIDISM 02/12/2007  .  DYSLIPIDEMIA 02/12/2007  . MORBID OBESITY 02/12/2007  . HYPERTENSION 02/12/2007  . SLEEP APNEA 02/12/2007   Past Medical History  Diagnosis Date  . Goiter     h/o  . Hypothyroidism     w/ h/o goiter  . Morbid obesity   . Sleep apnea     on CPAP  . Dyslipidemia   . Hypertension   . Osteoarthritis   . Depression   . Allergy     occasional seasonal  . Hyperlipidemia   . Diabetes mellitus     has diarrhea from medicine    Past Surgical History  Procedure Laterality Date  . Appendectomy    . Orthopedic surgery      multiple procedures  . Total knee arthroplasty  08/05/07    right  . Colonoscopy      No prescriptions prior to admission   No Known Allergies   History  Substance Use Topics  . Smoking status: Former Smoker -- 0.50 packs/day for 10 years    Types: Cigarettes    Quit date: 02/28/1980  . Smokeless tobacco: Never Used  . Alcohol Use: Yes     Comment: occasionally    Family History  Problem Relation Age of Onset  . Coronary artery disease Father     F MI age 13, 60 at age 63  . Hypertension Father   . Diabetes Mother     M , brother amd sister  . Lung cancer Mother     around the lungs  . Stomach  cancer Brother   . Esophageal cancer Brother   . Colon cancer Neg Hx   . Breast cancer Neg Hx   . Rectal cancer Neg Hx   . CAD Brother     died age 78, h/o DM-Tobacco-PVD     Review of Systems  Constitutional: Negative.   HENT: Negative.   Eyes: Negative.   Respiratory: Negative.   Cardiovascular: Negative.   Gastrointestinal: Negative.   Genitourinary: Negative.   Musculoskeletal: Negative.   Skin: Negative.   Neurological: Negative.   Endo/Heme/Allergies: Positive for environmental allergies.  Psychiatric/Behavioral: Positive for depression.    Objective:  Physical Exam  Constitutional: She is oriented to person, place, and time. She appears well-developed and well-nourished.  HENT:  Head: Normocephalic and atraumatic.  Mouth/Throat:  Oropharynx is clear and moist.  Eyes: Pupils are equal, round, and reactive to light.  Neck: Neck supple. No JVD present. No tracheal deviation present. No thyromegaly present.  Cardiovascular: Normal rate, regular rhythm, normal heart sounds and intact distal pulses.   Respiratory: Effort normal and breath sounds normal. No stridor. No respiratory distress. She has no wheezes.  GI: Soft. There is no tenderness. There is no guarding.  Musculoskeletal:       Left knee: She exhibits decreased range of motion, swelling and bony tenderness. She exhibits no ecchymosis, no deformity, no laceration and no erythema. Tenderness found.  Lymphadenopathy:    She has no cervical adenopathy.  Neurological: She is alert and oriented to person, place, and time.  Skin: Skin is warm and dry.  Psychiatric: She has a normal mood and affect.    Labs:  Estimated body mass index is 45.46 kg/(m^2) as calculated from the following:   Height as of 05/13/13: 5\' 4"  (1.626 m).   Weight as of 05/13/13: 120.203 kg (265 lb).   Imaging Review Plain radiographs demonstrate severe degenerative joint disease of the left knee(s). The overall alignment isneutral. The bone quality appears to be good for age and reported activity level.  Assessment/Plan:  End stage arthritis, left knee   The patient history, physical examination, clinical judgment of the provider and imaging studies are consistent with end stage degenerative joint disease of the left knee(s) and total knee arthroplasty is deemed medically necessary. The treatment options including medical management, injection therapy arthroscopy and arthroplasty were discussed at length. The risks and benefits of total knee arthroplasty were presented and reviewed. The risks due to aseptic loosening, infection, stiffness, patella tracking problems, thromboembolic complications and other imponderables were discussed. The patient acknowledged the explanation, agreed to proceed  with the plan and consent was signed. Patient is being admitted for inpatient treatment for surgery, pain control, PT, OT, prophylactic antibiotics, VTE prophylaxis, progressive ambulation and ADL's and discharge planning. The patient is planning to be discharged to skilled nursing facility / home.     [place] [name]   PAC  06/19/2013, 3:40 PM

## 2013-06-23 ENCOUNTER — Encounter: Payer: Self-pay | Admitting: Internal Medicine

## 2013-06-23 ENCOUNTER — Ambulatory Visit (HOSPITAL_BASED_OUTPATIENT_CLINIC_OR_DEPARTMENT_OTHER): Payer: BC Managed Care – PPO | Admitting: Radiology

## 2013-06-23 ENCOUNTER — Encounter (HOSPITAL_COMMUNITY)
Admission: RE | Admit: 2013-06-23 | Discharge: 2013-06-23 | Disposition: A | Discharge status: Home / Self Care | Payer: BC Managed Care – PPO | Source: Ambulatory Visit | Attending: Orthopedic Surgery | Admitting: Orthopedic Surgery

## 2013-06-23 ENCOUNTER — Ambulatory Visit (HOSPITAL_COMMUNITY)
Admission: RE | Admit: 2013-06-23 | Discharge: 2013-06-23 | Disposition: A | Discharge status: Home / Self Care | Payer: BC Managed Care – PPO | Source: Ambulatory Visit | Attending: Anesthesiology | Admitting: Anesthesiology

## 2013-06-23 ENCOUNTER — Encounter (INDEPENDENT_AMBULATORY_CARE_PROVIDER_SITE_OTHER): Payer: Self-pay

## 2013-06-23 ENCOUNTER — Encounter (HOSPITAL_COMMUNITY): Payer: Self-pay

## 2013-06-23 VITALS — BP 146/68 | Ht 65.0 in | Wt 262.0 lb

## 2013-06-23 DIAGNOSIS — R0989 Other specified symptoms and signs involving the circulatory and respiratory systems: Secondary | ICD-10-CM

## 2013-06-23 DIAGNOSIS — R002 Palpitations: Secondary | ICD-10-CM | POA: Insufficient documentation

## 2013-06-23 DIAGNOSIS — E119 Type 2 diabetes mellitus without complications: Secondary | ICD-10-CM

## 2013-06-23 DIAGNOSIS — Z87891 Personal history of nicotine dependence: Secondary | ICD-10-CM | POA: Insufficient documentation

## 2013-06-23 DIAGNOSIS — I4949 Other premature depolarization: Secondary | ICD-10-CM

## 2013-06-23 DIAGNOSIS — Z01818 Encounter for other preprocedural examination: Secondary | ICD-10-CM

## 2013-06-23 DIAGNOSIS — R0609 Other forms of dyspnea: Secondary | ICD-10-CM | POA: Insufficient documentation

## 2013-06-23 DIAGNOSIS — Z8249 Family history of ischemic heart disease and other diseases of the circulatory system: Secondary | ICD-10-CM | POA: Insufficient documentation

## 2013-06-23 HISTORY — DX: Other specified postprocedural states: Z98.890

## 2013-06-23 HISTORY — DX: Adverse effect of unspecified anesthetic, initial encounter: T41.45XA

## 2013-06-23 HISTORY — DX: Other specified postprocedural states: R11.2

## 2013-06-23 HISTORY — DX: Other seasonal allergic rhinitis: J30.2

## 2013-06-23 HISTORY — DX: Other complications of anesthesia, initial encounter: T88.59XA

## 2013-06-23 HISTORY — DX: Bradycardia, unspecified: R00.1

## 2013-06-23 LAB — APTT: APTT: 30 s (ref 24–37)

## 2013-06-23 LAB — URINALYSIS, ROUTINE W REFLEX MICROSCOPIC
BILIRUBIN URINE: NEGATIVE
GLUCOSE, UA: 250 mg/dL — AB
HGB URINE DIPSTICK: NEGATIVE
KETONES UR: NEGATIVE mg/dL
Nitrite: NEGATIVE
PROTEIN: NEGATIVE mg/dL
Specific Gravity, Urine: 1.019 (ref 1.005–1.030)
Urobilinogen, UA: 0.2 mg/dL (ref 0.0–1.0)
pH: 6 (ref 5.0–8.0)

## 2013-06-23 LAB — BASIC METABOLIC PANEL
BUN: 22 mg/dL (ref 6–23)
CALCIUM: 9.5 mg/dL (ref 8.4–10.5)
CO2: 25 mEq/L (ref 19–32)
CREATININE: 0.97 mg/dL (ref 0.50–1.10)
Chloride: 103 mEq/L (ref 96–112)
GFR calc non Af Amer: 61 mL/min — ABNORMAL LOW (ref >90)
GFR, EST AFRICAN AMERICAN: 71 mL/min — AB (ref >90)
Glucose, Bld: 150 mg/dL — ABNORMAL HIGH (ref 70–99)
Potassium: 4.1 mEq/L (ref 3.7–5.3)
Sodium: 141 mEq/L (ref 137–147)

## 2013-06-23 LAB — CBC
HEMATOCRIT: 38.8 % (ref 36.0–46.0)
Hemoglobin: 12.4 g/dL (ref 12.0–15.0)
MCH: 27.5 pg (ref 26.0–34.0)
MCHC: 32 g/dL (ref 30.0–36.0)
MCV: 86 fL (ref 78.0–100.0)
PLATELETS: 265 10*3/uL (ref 150–400)
RBC: 4.51 MIL/uL (ref 3.87–5.11)
RDW: 15.2 % (ref 11.5–15.5)
WBC: 8.3 10*3/uL (ref 4.0–10.5)

## 2013-06-23 LAB — URINE MICROSCOPIC-ADD ON

## 2013-06-23 LAB — PROTIME-INR
INR: 0.95 (ref 0.00–1.49)
Prothrombin Time: 12.5 seconds (ref 11.6–15.2)

## 2013-06-23 LAB — SURGICAL PCR SCREEN
MRSA, PCR: NEGATIVE
STAPHYLOCOCCUS AUREUS: POSITIVE — AB

## 2013-06-23 MED ORDER — TECHNETIUM TC 99M SESTAMIBI GENERIC - CARDIOLITE
30.0000 | Freq: Once | INTRAVENOUS | Status: AC | PRN
  Administered 2013-06-23: 30 via INTRAVENOUS

## 2013-06-23 MED ORDER — REGADENOSON 0.4 MG/5ML IV SOLN
0.4000 mg | Freq: Once | INTRAVENOUS | Status: AC
  Administered 2013-06-23: 0.4 mg via INTRAVENOUS

## 2013-06-23 NOTE — Patient Instructions (Addendum)
[place]  06/23/2013   Your procedure is scheduled on: 07/01/13  Report to [facility] at 10:35 AM.  Call this number if you have problems the morning of surgery [phone]: [phone]   Remember: please bring CPAP mask and tubing on day of surgery   Do not eat food After Midnight, clear liquids from midnight until 7:35am on 07/01/13 the nothing.      Take these medicines the morning of surgery with A SIP OF WATER: alprazolam/xanax if needed, zyrtec if needed, levothyroxine/synthroid, metoprolol/lopressor   Do not wear jewelry, make-up or nail polish.  Do not wear lotions, powders, or perfumes. You may wear deodorant.  Do not shave 48 hours prior to surgery. Men may shave face and neck.  Do not bring valuables to the hospital.  Contacts, dentures or bridgework may not be worn into surgery.  Leave suitcase in the car. After surgery it may be brought to your room.  For patients admitted to the hospital, checkout time is 11:00 AM the day of discharge.   Please read over the following fact sheets that you were given: MRSA Information Paulette Blanch, RN  pre op nurse call if needed [phone]    [facility] - Preparing for Surgery Before surgery, you can play an important role.  Because skin is not sterile, your skin needs to be as free of germs as possible.  You can reduce the number of germs on your skin by washing with CHG (chlorahexidine gluconate) soap before surgery.  CHG is an antiseptic cleaner which kills germs and bonds with the skin to continue killing germs even after washing. Please DO NOT use if you have an allergy to CHG or antibacterial soaps.  If your skin becomes reddened/irritated stop using the CHG and inform your nurse when you arrive at Short Stay. Do not shave (including legs and underarms) for at least 48 hours prior to the first CHG shower.  You may shave your face. Please follow these instructions carefully:  1.  Shower with CHG Soap the night  before surgery and the  morning of Surgery.  2.  If you choose to wash your hair, wash your hair first as usual with your  normal  shampoo.  3.  After you shampoo, rinse your hair and body thoroughly to remove the  shampoo.                            4.  Use CHG as you would any other liquid soap.  You can apply chg directly  to the skin and wash                       Gently with a scrungie or clean washcloth.  5.  Apply the CHG Soap to your body ONLY FROM THE NECK DOWN.   Do not use on open                           Wound or open sores. Avoid contact with eyes, ears mouth and genitals (private parts).                        Genitals (private parts) with your normal soap.             6.  Wash thoroughly, paying special attention to the area where your surgery  will be performed.  7.  Thoroughly rinse your body with warm water from the neck down.  8.  DO NOT shower/wash with your normal soap after using and rinsing off  the CHG Soap.                9.  Pat yourself dry with a clean towel.            10.  Wear clean pajamas.            11.  Place clean sheets on your bed the night of your first shower and do not  sleep with pets. Day of Surgery : Do not apply any lotions/deodorants the morning of surgery.  Please wear clean clothes to the hospital/surgery center.  FAILURE TO FOLLOW THESE INSTRUCTIONS MAY RESULT IN THE CANCELLATION OF YOUR SURGERY PATIENT SIGNATURE_________________________________  NURSE SIGNATURE__________________________________  ________________________________________________________________________    CLEAR LIQUID DIET   Foods Allowed                                                                     Foods Excluded  Coffee and tea, regular and decaf                             liquids that you cannot  Plain Jell-O in any flavor                                             see through such as: Fruit ices (not with fruit pulp)                                      milk, soups, orange juice  Iced Popsicles                                    All solid food Carbonated beverages, regular and diet                                    Cranberry, grape and apple juices Sports drinks like Gatorade Lightly seasoned clear broth or consume(fat free) Sugar, honey syrup  Sample Menu Breakfast                                Lunch                                     Supper Cranberry juice                    Beef broth                            Chicken broth Jell-O  Grape juice                           Apple juice Coffee or tea                        Jell-O                                      Popsicle                                                Coffee or tea                        Coffee or tea  _____________________________________________________________________   WHAT IS A BLOOD TRANSFUSION? Blood Transfusion Information  A transfusion is the replacement of blood or some of its parts. Blood is made up of multiple cells which provide different functions.  Red blood cells carry oxygen and are used for blood loss replacement.  White blood cells fight against infection.  Platelets control bleeding.  Plasma helps clot blood.  Other blood products are available for specialized needs, such as hemophilia or other clotting disorders. BEFORE THE TRANSFUSION  Who gives blood for transfusions?   Healthy volunteers who are fully evaluated to make sure their blood is safe. This is blood bank blood. Transfusion therapy is the safest it has ever been in the practice of medicine. Before blood is taken from a donor, a complete history is taken to make sure that person has no history of diseases nor engages in risky social behavior (examples are intravenous drug use or sexual activity with multiple partners). The donor's travel history is screened to minimize risk of transmitting infections, such as malaria. The donated blood is  tested for signs of infectious diseases, such as HIV and hepatitis. The blood is then tested to be sure it is compatible with you in order to minimize the chance of a transfusion reaction. If you or a relative donates blood, this is often done in anticipation of surgery and is not appropriate for emergency situations. It takes many days to process the donated blood. RISKS AND COMPLICATIONS Although transfusion therapy is very safe and saves many lives, the main dangers of transfusion include:   Getting an infectious disease.  Developing a transfusion reaction. This is an allergic reaction to something in the blood you were given. Every precaution is taken to prevent this. The decision to have a blood transfusion has been considered carefully by your caregiver before blood is given. Blood is not given unless the benefits outweigh the risks. AFTER THE TRANSFUSION  Right after receiving a blood transfusion, you will usually feel much better and more energetic. This is especially true if your red blood cells have gotten low (anemic). The transfusion raises the level of the red blood cells which carry oxygen, and this usually causes an energy increase.  The nurse administering the transfusion will monitor you carefully for complications. HOME CARE INSTRUCTIONS  No special instructions are needed after a transfusion. You may find your energy is better. Speak with your caregiver about any limitations on activity for underlying diseases you may have. SEEK MEDICAL CARE IF:  Your condition is not improving after your transfusion.  You develop redness or irritation at the intravenous (IV) site. SEEK IMMEDIATE MEDICAL CARE IF:  Any of the following symptoms occur over the next 12 hours:  Shaking chills.  You have a temperature by mouth above 102 F (38.9 C), not controlled by medicine.  Chest, back, or muscle pain.  People around you feel you are not acting correctly or are confused.  Shortness  of breath or difficulty breathing.  Dizziness and fainting.  You get a rash or develop hives.  You have a decrease in urine output.  Your urine turns a dark color or changes to pink, red, or brown. Any of the following symptoms occur over the next 10 days:  You have a temperature by mouth above 102 F (38.9 C), not controlled by medicine.  Shortness of breath.  Weakness after normal activity.  The white part of the eye turns yellow (jaundice).  You have a decrease in the amount of urine or are urinating less often.  Your urine turns a dark color or changes to pink, red, or brown. Document Released: 02/11/2000 Document Revised: 05/08/2011 Document Reviewed: 09/30/2007 ExitCare Patient Information 2014 [place].  _______________________________________________________________________  Incentive Spirometer  An incentive spirometer is a tool that can help keep your lungs clear and active. This tool measures how well you are filling your lungs with each breath. Taking long deep breaths may help reverse or decrease the chance of developing breathing (pulmonary) problems (especially infection) following:  A long period of time when you are unable to move or be active. BEFORE THE PROCEDURE   If the spirometer includes an indicator to show your best effort, your nurse or respiratory therapist will set it to a desired goal.  If possible, sit up straight or lean slightly forward. Try not to slouch.  Hold the incentive spirometer in an upright position. INSTRUCTIONS FOR USE  1. Sit on the edge of your bed if possible, or sit up as far as you can in bed or on a chair. 2. Hold the incentive spirometer in an upright position. 3. Breathe out normally. 4. Place the mouthpiece in your mouth and seal your lips tightly around it. 5. Breathe in slowly and as deeply as possible, raising the piston or the ball toward the top of the column. 6. Hold your breath for 3-5 seconds or for as  long as possible. Allow the piston or ball to fall to the bottom of the column. 7. Remove the mouthpiece from your mouth and breathe out normally. 8. Rest for a few seconds and repeat Steps 1 through 7 at least 10 times every 1-2 hours when you are awake. Take your time and take a few normal breaths between deep breaths. 9. The spirometer may include an indicator to show your best effort. Use the indicator as a goal to work toward during each repetition. 10. After each set of 10 deep breaths, practice coughing to be sure your lungs are clear. If you have an incision (the cut made at the time of surgery), support your incision when coughing by placing a pillow or rolled up towels firmly against it. Once you are able to get out of bed, walk around indoors and cough well. You may stop using the incentive spirometer when instructed by your caregiver.  RISKS AND COMPLICATIONS  Take your time so you do not get dizzy or light-headed.  If you are in pain, you may need to take or ask  for pain medication before doing incentive spirometry. It is harder to take a deep breath if you are having pain. AFTER USE  Rest and breathe slowly and easily.  It can be helpful to keep track of a log of your progress. Your caregiver can provide you with a simple table to help with this. If you are using the spirometer at home, follow these instructions: [place] IF:   You are having difficultly using the spirometer.  You have trouble using the spirometer as often as instructed.  Your pain medication is not giving enough relief while using the spirometer.  You develop fever of 100.5 F (38.1 C) or higher. SEEK IMMEDIATE MEDICAL CARE IF:   You cough up bloody sputum that had not been present before.  You develop fever of 102 F (38.9 C) or greater.  You develop worsening pain at or near the incision site. MAKE SURE YOU:   Understand these instructions.  Will watch your condition.  Will get help  right away if you are not doing well or get worse. Document Released: 06/26/2006 Document Revised: 05/08/2011 Document Reviewed: 08/27/2006 [facility] Patient Information 2014 [place], [place].   ________________________________________________________________________

## 2013-06-23 NOTE — Progress Notes (Signed)
[facility] [address] [place], [place] [address] [phone]    Cardiology Nuclear Med Study  JUNELLE HASHEMI is a 63 y.o. female     MRN : [id number]     DOB: [birth year]  Procedure Date: 06/23/2013  Nuclear Med Background Indication for Stress Test:  Evaluation for Ischemia, and Surgical Clearance for  (L) TKR on 07-01-2013 by Paralee Cancel, MD History:  No Hx of CAD  Cardiac Risk Factors: Strong, Premature Family History - CAD, History of Smoking, Hypertension, Lipids and NIDDM  Symptoms:  DOE and Palpitations   Nuclear Pre-Procedure Caffeine/Decaff Intake:  None > 12 hrs NPO After: 7:00pm   Lungs:  clear O2 Sat: 95% on room air. IV 0.9% NS with Angio Cath:  22g  IV Site: R Forearm x 1, tolerated well IV Started by:  Irven Baltimore, RN  Chest Size (in):  38 Cup Size: D  Height: 5\' 5"  (1.651 m)  Weight:  262 lb (118.842 kg)  BMI:  Body mass index is 43.6 kg/(m^2). Tech Comments:  No medications (Amaryl, Janumet, or Lopressor) today. Irven Baltimore, RN.    Nuclear Med Study 1 or 2 day study: 2 day  Stress Test Type:  Carlton Adam  Reading MD: N/A  Order Authorizing Provider:  Kathlene November, MD  Resting Radionuclide: Technetium 47m Sestamibi  Resting Radionuclide Dose: 33.0 mCi on 06/24/13   Stress Radionuclide:  Technetium 83m Sestamibi  Stress Radionuclide Dose: 33.0 mCi on 06/23/13           Stress Protocol Rest HR: 49 Stress HR: 72  Rest BP: 146/68 Stress BP: 174/76  Exercise Time (min): n/a METS: n/a   Predicted Max HR: 157 bpm % Max HR: 45.86 bpm Rate Pressure Product: 12528   Dose of Adenosine (mg):  n/a Dose of Lexiscan: 0.4 mg  Dose of Atropine (mg): n/a Dose of Dobutamine: n/a mcg/kg/min (at max HR)  Stress Test Technologist: Perrin Maltese, EMT-P  Nuclear Technologist:  Vedia Pereyra, CNMT     Rest Procedure:  Myocardial perfusion imaging was performed at rest 45 minutes following the intravenous administration of Technetium 37m  Sestamibi. Rest ECG: NSR - Normal EKG  Stress Procedure:  The patient received IV Lexiscan 0.4 mg over 15-seconds.  Technetium 34m Sestamibi injected at 30-seconds. This patient was lt. Headed, felt weird, and had a headache with the Lexiscan injection. Quantitative spect images were obtained after a 45 minute delay. Stress ECG: There are scattered PVCs.  QPS Raw Data Images:  There is interference from nuclear activity from structures below the diaphragm. This does not affect the ability to read the study. Stress Images:  There is decreased uptake in the inferior wall. Rest Images:  There is decreased uptake in the inferior wall. Subtraction (SDS):  There is a fixed inferior defect that is most consistent with diaphragmatic attenuation. Transient Ischemic Dilatation (Normal <1.22):  1.08 Lung/Heart Ratio (Normal <0.45):  0.25  Quantitative Gated Spect Images QGS EDV:  119 ml QGS ESV:  42 ml  Impression Exercise Capacity:  Lexiscan with no exercise. BP Response:  Hypertensive blood pressure response. Clinical Symptoms:  No significant symptoms noted. ECG Impression:  No significant ECG changes with Lexiscan. Comparison with Prior Nuclear Study: No previous nuclear study performed  Overall Impression:  Low risk stress nuclear study with a fixed inferior bowel attenuation artifact.  LV Ejection Fraction: 64%.  LV Wall Motion:  NL LV Function; NL Wall Motion  Pixie Casino, MD,  [facility] Board Certified in Nuclear Cardiology Attending Cardiologist [place]

## 2013-06-24 ENCOUNTER — Ambulatory Visit (HOSPITAL_COMMUNITY): Payer: BC Managed Care – PPO | Attending: Cardiovascular Disease

## 2013-06-24 ENCOUNTER — Encounter (INDEPENDENT_AMBULATORY_CARE_PROVIDER_SITE_OTHER): Payer: Self-pay

## 2013-06-24 ENCOUNTER — Encounter: Payer: Self-pay | Admitting: Cardiovascular Disease

## 2013-06-24 DIAGNOSIS — R0989 Other specified symptoms and signs involving the circulatory and respiratory systems: Secondary | ICD-10-CM

## 2013-06-24 DIAGNOSIS — R079 Chest pain, unspecified: Secondary | ICD-10-CM

## 2013-06-24 MED ORDER — TECHNETIUM TC 99M SESTAMIBI GENERIC - CARDIOLITE: 33.0000 | Freq: Once | INTRAVENOUS | Status: AC | PRN

## 2013-06-24 NOTE — Progress Notes (Signed)
Mupirocin script called into [facility] at [address] [place], [place] and talked to Julie,Pharmacist at [facility].

## 2013-06-25 ENCOUNTER — Telehealth: Payer: Self-pay | Admitting: Internal Medicine

## 2013-06-25 NOTE — Telephone Encounter (Signed)
Advise patient, stress test was negative,  she is clear for surgery. Also notify the patient's surgeon.

## 2013-06-26 NOTE — Telephone Encounter (Signed)
Caller name: Demiana Relation to pt:  [facility] Call back number: [phone]  (((Fax # [phone])) Pharmacy:  Reason for call:   Sharmel is needing the clearance for surgery faxed to [facility] before 06/27/13 12:00p.

## 2013-06-26 NOTE — Telephone Encounter (Signed)
Letter/clearance printed and faxed to [facility]

## 2013-06-27 NOTE — Progress Notes (Signed)
Surgery clearance note 06/26/13 Dr. Larose Kells on chart

## 2013-06-30 MED ORDER — CEFAZOLIN SODIUM 10 G IJ SOLR
3.0000 g | INTRAMUSCULAR | Status: AC
  Administered 2013-07-01: 3 g via INTRAVENOUS
  Filled 2013-06-30 (×2): qty 3000

## 2013-07-01 ENCOUNTER — Inpatient Hospital Stay (HOSPITAL_COMMUNITY)
Admission: RE | Admit: 2013-07-01 | Discharge: 2013-07-04 | DRG: 470 | Disposition: A | Discharge status: Skilled Nursing Facility | Payer: BC Managed Care – PPO | Source: Ambulatory Visit | Attending: Orthopedic Surgery | Admitting: Orthopedic Surgery

## 2013-07-01 ENCOUNTER — Inpatient Hospital Stay (HOSPITAL_COMMUNITY): Payer: BC Managed Care – PPO | Admitting: Anesthesiology

## 2013-07-01 ENCOUNTER — Encounter (HOSPITAL_COMMUNITY): Payer: Self-pay | Admitting: *Deleted

## 2013-07-01 ENCOUNTER — Encounter (HOSPITAL_COMMUNITY)
Admission: RE | Disposition: A | Discharge status: Skilled Nursing Facility | Payer: Self-pay | Source: Ambulatory Visit | Attending: Orthopedic Surgery

## 2013-07-01 ENCOUNTER — Encounter (HOSPITAL_COMMUNITY): Payer: BC Managed Care – PPO | Admitting: Anesthesiology

## 2013-07-01 DIAGNOSIS — Z87891 Personal history of nicotine dependence: Secondary | ICD-10-CM

## 2013-07-01 DIAGNOSIS — E039 Hypothyroidism, unspecified: Secondary | ICD-10-CM | POA: Diagnosis present

## 2013-07-01 DIAGNOSIS — E559 Vitamin D deficiency, unspecified: Secondary | ICD-10-CM | POA: Diagnosis present

## 2013-07-01 DIAGNOSIS — I10 Essential (primary) hypertension: Secondary | ICD-10-CM | POA: Diagnosis present

## 2013-07-01 DIAGNOSIS — D62 Acute posthemorrhagic anemia: Secondary | ICD-10-CM | POA: Diagnosis not present

## 2013-07-01 DIAGNOSIS — Z801 Family history of malignant neoplasm of trachea, bronchus and lung: Secondary | ICD-10-CM

## 2013-07-01 DIAGNOSIS — G4733 Obstructive sleep apnea (adult) (pediatric): Secondary | ICD-10-CM | POA: Diagnosis present

## 2013-07-01 DIAGNOSIS — Z6841 Body Mass Index (BMI) 40.0 and over, adult: Secondary | ICD-10-CM

## 2013-07-01 DIAGNOSIS — Z8249 Family history of ischemic heart disease and other diseases of the circulatory system: Secondary | ICD-10-CM

## 2013-07-01 DIAGNOSIS — F3289 Other specified depressive episodes: Secondary | ICD-10-CM | POA: Diagnosis present

## 2013-07-01 DIAGNOSIS — E785 Hyperlipidemia, unspecified: Secondary | ICD-10-CM | POA: Diagnosis present

## 2013-07-01 DIAGNOSIS — Z833 Family history of diabetes mellitus: Secondary | ICD-10-CM

## 2013-07-01 DIAGNOSIS — Z800 Family history of malignant neoplasm of digestive organs: Secondary | ICD-10-CM

## 2013-07-01 DIAGNOSIS — M179 Osteoarthritis of knee, unspecified: Secondary | ICD-10-CM | POA: Diagnosis present

## 2013-07-01 DIAGNOSIS — D500 Iron deficiency anemia secondary to blood loss (chronic): Secondary | ICD-10-CM | POA: Diagnosis not present

## 2013-07-01 DIAGNOSIS — F329 Major depressive disorder, single episode, unspecified: Secondary | ICD-10-CM | POA: Diagnosis present

## 2013-07-01 DIAGNOSIS — M1710 Unilateral primary osteoarthritis, unspecified knee: Principal | ICD-10-CM | POA: Diagnosis present

## 2013-07-01 DIAGNOSIS — Z96659 Presence of unspecified artificial knee joint: Secondary | ICD-10-CM

## 2013-07-01 DIAGNOSIS — M6580 Other synovitis and tenosynovitis, unspecified site: Secondary | ICD-10-CM | POA: Diagnosis present

## 2013-07-01 DIAGNOSIS — E119 Type 2 diabetes mellitus without complications: Secondary | ICD-10-CM | POA: Diagnosis present

## 2013-07-01 HISTORY — PX: TOTAL KNEE ARTHROPLASTY: SHX125

## 2013-07-01 LAB — GLUCOSE, CAPILLARY
GLUCOSE-CAPILLARY: 138 mg/dL — AB (ref 70–99)
GLUCOSE-CAPILLARY: 100 mg/dL — AB (ref 70–99)
GLUCOSE-CAPILLARY: 154 mg/dL — AB (ref 70–99)
Glucose-Capillary: 82 mg/dL (ref 70–99)

## 2013-07-01 LAB — TYPE AND SCREEN
ABO/RH(D): O POS
Antibody Screen: NEGATIVE

## 2013-07-01 SURGERY — ARTHROPLASTY, KNEE, TOTAL
Anesthesia: Spinal | Site: Knee | Laterality: Left

## 2013-07-01 MED ORDER — SODIUM CHLORIDE 0.9 % IJ SOLN
INTRAMUSCULAR | Status: AC
  Filled 2013-07-01: qty 10

## 2013-07-01 MED ORDER — DIPHENHYDRAMINE HCL 12.5 MG/5ML PO ELIX: 12.5000 mg | ORAL_SOLUTION | ORAL | Status: DC | PRN

## 2013-07-01 MED ORDER — PROPOFOL 10 MG/ML IV BOLUS
INTRAVENOUS | Status: AC
  Filled 2013-07-01: qty 20

## 2013-07-01 MED ORDER — BUPIVACAINE IN DEXTROSE 0.75-8.25 % IT SOLN
INTRATHECAL | Status: DC | PRN
  Administered 2013-07-01: 1.6 mL via INTRATHECAL

## 2013-07-01 MED ORDER — PHENOL 1.4 % MT LIQD: 1.0000 | OROMUCOSAL | Status: DC | PRN

## 2013-07-01 MED ORDER — ACETAMINOPHEN 500 MG PO TABS
1000.0000 mg | ORAL_TABLET | Freq: Four times a day (QID) | ORAL | Status: AC
  Administered 2013-07-01 – 2013-07-02 (×4): 1000 mg via ORAL
  Filled 2013-07-01 (×5): qty 2

## 2013-07-01 MED ORDER — SODIUM CHLORIDE 0.9 % IR SOLN
Status: DC | PRN
  Administered 2013-07-01: 1000 mL

## 2013-07-01 MED ORDER — METOCLOPRAMIDE HCL 5 MG/ML IJ SOLN: 5.0000 mg | Freq: Three times a day (TID) | INTRAMUSCULAR | Status: DC | PRN

## 2013-07-01 MED ORDER — SCOPOLAMINE 1 MG/3DAYS TD PT72
MEDICATED_PATCH | TRANSDERMAL | Status: AC
  Filled 2013-07-01: qty 1

## 2013-07-01 MED ORDER — BUPIVACAINE-EPINEPHRINE (PF) 0.25% -1:200000 IJ SOLN
INTRAMUSCULAR | Status: DC | PRN
  Administered 2013-07-01: 30 mL

## 2013-07-01 MED ORDER — HYDROMORPHONE HCL PF 1 MG/ML IJ SOLN
0.5000 mg | INTRAMUSCULAR | Status: DC | PRN
  Administered 2013-07-01: 0.5 mg via INTRAVENOUS
  Administered 2013-07-01: 1 mg via INTRAVENOUS
  Administered 2013-07-02: 0.5 mg via INTRAVENOUS
  Administered 2013-07-03: 1 mg via INTRAVENOUS
  Filled 2013-07-01: qty 2
  Filled 2013-07-01 (×4): qty 1

## 2013-07-01 MED ORDER — PROPOFOL INFUSION 10 MG/ML OPTIME
INTRAVENOUS | Status: DC | PRN
  Administered 2013-07-01: 75 ug/kg/min via INTRAVENOUS

## 2013-07-01 MED ORDER — FENTANYL CITRATE 0.05 MG/ML IJ SOLN
INTRAMUSCULAR | Status: AC
  Filled 2013-07-01: qty 2

## 2013-07-01 MED ORDER — PHENYLEPHRINE HCL 10 MG/ML IJ SOLN
INTRAMUSCULAR | Status: AC
  Filled 2013-07-01: qty 1

## 2013-07-01 MED ORDER — BUPIVACAINE-EPINEPHRINE (PF) 0.25% -1:200000 IJ SOLN
INTRAMUSCULAR | Status: AC
  Filled 2013-07-01: qty 30

## 2013-07-01 MED ORDER — LINAGLIPTIN 5 MG PO TABS
5.0000 mg | ORAL_TABLET | Freq: Every day | ORAL | Status: DC
  Administered 2013-07-02 – 2013-07-04 (×3): 5 mg via ORAL
  Filled 2013-07-01 (×4): qty 1

## 2013-07-01 MED ORDER — POLYETHYLENE GLYCOL 3350 17 G PO PACK: 17.0000 g | PACK | Freq: Every day | ORAL | Status: DC | PRN

## 2013-07-01 MED ORDER — METHOCARBAMOL 1000 MG/10ML IJ SOLN
500.0000 mg | Freq: Four times a day (QID) | INTRAVENOUS | Status: DC | PRN
  Filled 2013-07-01: qty 5

## 2013-07-01 MED ORDER — MIDAZOLAM HCL 2 MG/2ML IJ SOLN
INTRAMUSCULAR | Status: AC
  Filled 2013-07-01: qty 2

## 2013-07-01 MED ORDER — CEFAZOLIN SODIUM-DEXTROSE 2-3 GM-% IV SOLR
2.0000 g | Freq: Four times a day (QID) | INTRAVENOUS | Status: AC
  Administered 2013-07-01 – 2013-07-02 (×2): 2 g via INTRAVENOUS
  Filled 2013-07-01 (×2): qty 50

## 2013-07-01 MED ORDER — INSULIN ASPART 100 UNIT/ML ~~LOC~~ SOLN
0.0000 [IU] | Freq: Three times a day (TID) | SUBCUTANEOUS | Status: DC
  Administered 2013-07-02 (×2): 3 [IU] via SUBCUTANEOUS
  Administered 2013-07-02: 2 [IU] via SUBCUTANEOUS
  Administered 2013-07-03 (×2): 3 [IU] via SUBCUTANEOUS
  Administered 2013-07-04 (×2): 2 [IU] via SUBCUTANEOUS

## 2013-07-01 MED ORDER — MIDAZOLAM HCL 5 MG/5ML IJ SOLN
INTRAMUSCULAR | Status: DC | PRN
  Administered 2013-07-01: 2 mg via INTRAVENOUS

## 2013-07-01 MED ORDER — ONDANSETRON HCL 4 MG/2ML IJ SOLN
INTRAMUSCULAR | Status: DC | PRN
  Administered 2013-07-01: 4 mg via INTRAVENOUS

## 2013-07-01 MED ORDER — INSULIN ASPART 100 UNIT/ML ~~LOC~~ SOLN
4.0000 [IU] | Freq: Three times a day (TID) | SUBCUTANEOUS | Status: DC
  Administered 2013-07-02 – 2013-07-04 (×7): 4 [IU] via SUBCUTANEOUS

## 2013-07-01 MED ORDER — EPHEDRINE SULFATE 50 MG/ML IJ SOLN
INTRAMUSCULAR | Status: AC
  Filled 2013-07-01: qty 1

## 2013-07-01 MED ORDER — METFORMIN HCL 500 MG PO TABS
1000.0000 mg | ORAL_TABLET | Freq: Two times a day (BID) | ORAL | Status: DC
  Administered 2013-07-02 – 2013-07-04 (×5): 1000 mg via ORAL
  Filled 2013-07-01 (×7): qty 2

## 2013-07-01 MED ORDER — METHOCARBAMOL 500 MG PO TABS
500.0000 mg | ORAL_TABLET | Freq: Four times a day (QID) | ORAL | Status: DC | PRN
  Administered 2013-07-01 – 2013-07-04 (×9): 500 mg via ORAL
  Filled 2013-07-01 (×10): qty 1

## 2013-07-01 MED ORDER — ALPRAZOLAM 0.5 MG PO TABS: 0.5000 mg | ORAL_TABLET | Freq: Three times a day (TID) | ORAL | Status: DC | PRN

## 2013-07-01 MED ORDER — HYDROCODONE-ACETAMINOPHEN 7.5-325 MG PO TABS
1.0000 | ORAL_TABLET | Freq: Four times a day (QID) | ORAL | Status: DC
  Administered 2013-07-01 – 2013-07-02 (×3): 1 via ORAL
  Filled 2013-07-01: qty 1
  Filled 2013-07-01: qty 2
  Filled 2013-07-01: qty 1

## 2013-07-01 MED ORDER — 0.9 % SODIUM CHLORIDE (POUR BTL) OPTIME
TOPICAL | Status: DC | PRN
  Administered 2013-07-01: 1000 mL

## 2013-07-01 MED ORDER — METOCLOPRAMIDE HCL 10 MG PO TABS: 5.0000 mg | ORAL_TABLET | Freq: Three times a day (TID) | ORAL | Status: DC | PRN

## 2013-07-01 MED ORDER — ONDANSETRON HCL 4 MG PO TABS: 4.0000 mg | ORAL_TABLET | Freq: Four times a day (QID) | ORAL | Status: DC | PRN

## 2013-07-01 MED ORDER — INSULIN ASPART 100 UNIT/ML ~~LOC~~ SOLN
0.0000 [IU] | Freq: Every day | SUBCUTANEOUS | Status: DC
  Administered 2013-07-02: 2 [IU] via SUBCUTANEOUS

## 2013-07-01 MED ORDER — ALUM & MAG HYDROXIDE-SIMETH 200-200-20 MG/5ML PO SUSP: 30.0000 mL | ORAL | Status: DC | PRN

## 2013-07-01 MED ORDER — ASPIRIN EC 325 MG PO TBEC
325.0000 mg | DELAYED_RELEASE_TABLET | Freq: Two times a day (BID) | ORAL | Status: DC
  Administered 2013-07-02 – 2013-07-04 (×5): 325 mg via ORAL
  Filled 2013-07-01 (×7): qty 1

## 2013-07-01 MED ORDER — PHENYLEPHRINE HCL 10 MG/ML IJ SOLN
10.0000 mg | INTRAVENOUS | Status: DC | PRN
  Administered 2013-07-01: 40 ug/min via INTRAVENOUS

## 2013-07-01 MED ORDER — SODIUM CHLORIDE 0.9 % IV SOLN
INTRAVENOUS | Status: DC
  Administered 2013-07-02: 03:00:00 via INTRAVENOUS
  Filled 2013-07-01 (×10): qty 1000

## 2013-07-01 MED ORDER — METOPROLOL TARTRATE 50 MG PO TABS
50.0000 mg | ORAL_TABLET | Freq: Once | ORAL | Status: AC
  Administered 2013-07-01: 50 mg via ORAL
  Filled 2013-07-01: qty 1

## 2013-07-01 MED ORDER — PROPOFOL 10 MG/ML IV BOLUS
INTRAVENOUS | Status: DC | PRN
  Administered 2013-07-01 (×2): 20 mg via INTRAVENOUS

## 2013-07-01 MED ORDER — SCOPOLAMINE 1 MG/3DAYS TD PT72
1.0000 | MEDICATED_PATCH | Freq: Once | TRANSDERMAL | Status: AC
  Administered 2013-07-01: 1.5 mg via TRANSDERMAL
  Filled 2013-07-01: qty 1

## 2013-07-01 MED ORDER — BUPIVACAINE LIPOSOME 1.3 % IJ SUSP
20.0000 mL | Freq: Once | INTRAMUSCULAR | Status: AC
  Administered 2013-07-01: 20 mL
  Filled 2013-07-01: qty 20

## 2013-07-01 MED ORDER — LEVOTHYROXINE SODIUM 150 MCG PO TABS
150.0000 ug | ORAL_TABLET | Freq: Every day | ORAL | Status: DC
  Administered 2013-07-02 – 2013-07-04 (×3): 150 ug via ORAL
  Filled 2013-07-01 (×4): qty 1

## 2013-07-01 MED ORDER — TRANEXAMIC ACID 100 MG/ML IV SOLN
1000.0000 mg | Freq: Once | INTRAVENOUS | Status: AC
  Administered 2013-07-01: 1000 mg via INTRAVENOUS
  Filled 2013-07-01: qty 10

## 2013-07-01 MED ORDER — ATORVASTATIN CALCIUM 20 MG PO TABS
20.0000 mg | ORAL_TABLET | Freq: Every day | ORAL | Status: DC
  Administered 2013-07-02 – 2013-07-03 (×2): 20 mg via ORAL
  Filled 2013-07-01 (×3): qty 1

## 2013-07-01 MED ORDER — FERROUS SULFATE 325 (65 FE) MG PO TABS
325.0000 mg | ORAL_TABLET | Freq: Three times a day (TID) | ORAL | Status: DC
  Administered 2013-07-02 – 2013-07-04 (×8): 325 mg via ORAL
  Filled 2013-07-01 (×10): qty 1

## 2013-07-01 MED ORDER — PROMETHAZINE HCL 25 MG/ML IJ SOLN: 6.2500 mg | INTRAMUSCULAR | Status: DC | PRN

## 2013-07-01 MED ORDER — GLIMEPIRIDE 2 MG PO TABS
2.0000 mg | ORAL_TABLET | Freq: Every day | ORAL | Status: DC
  Administered 2013-07-02 – 2013-07-04 (×3): 2 mg via ORAL
  Filled 2013-07-01 (×3): qty 1

## 2013-07-01 MED ORDER — EPHEDRINE SULFATE 50 MG/ML IJ SOLN
INTRAMUSCULAR | Status: DC | PRN
  Administered 2013-07-01 (×2): 10 mg via INTRAVENOUS

## 2013-07-01 MED ORDER — HYDROCHLOROTHIAZIDE 25 MG PO TABS
25.0000 mg | ORAL_TABLET | Freq: Every morning | ORAL | Status: DC
  Administered 2013-07-02 – 2013-07-04 (×3): 25 mg via ORAL
  Filled 2013-07-01 (×3): qty 1

## 2013-07-01 MED ORDER — SODIUM CHLORIDE 0.9 % IJ SOLN
INTRAMUSCULAR | Status: DC | PRN
  Administered 2013-07-01: 9 mL

## 2013-07-01 MED ORDER — KETOROLAC TROMETHAMINE 30 MG/ML IJ SOLN
INTRAMUSCULAR | Status: DC | PRN
  Administered 2013-07-01: 30 mg via INTRAMUSCULAR

## 2013-07-01 MED ORDER — LACTATED RINGERS IV SOLN
INTRAVENOUS | Status: DC | PRN
  Administered 2013-07-01 (×3): via INTRAVENOUS

## 2013-07-01 MED ORDER — ONDANSETRON HCL 4 MG/2ML IJ SOLN: 4.0000 mg | Freq: Four times a day (QID) | INTRAMUSCULAR | Status: DC | PRN

## 2013-07-01 MED ORDER — HYDROMORPHONE HCL PF 1 MG/ML IJ SOLN: 0.2500 mg | INTRAMUSCULAR | Status: DC | PRN

## 2013-07-01 MED ORDER — METOPROLOL TARTRATE 50 MG PO TABS
50.0000 mg | ORAL_TABLET | Freq: Two times a day (BID) | ORAL | Status: DC
  Administered 2013-07-01 – 2013-07-04 (×6): 50 mg via ORAL
  Filled 2013-07-01 (×7): qty 1

## 2013-07-01 MED ORDER — KETOROLAC TROMETHAMINE 30 MG/ML IJ SOLN
INTRAMUSCULAR | Status: AC
  Filled 2013-07-01: qty 1

## 2013-07-01 MED ORDER — SERTRALINE HCL 50 MG PO TABS
150.0000 mg | ORAL_TABLET | Freq: Every day | ORAL | Status: DC
  Administered 2013-07-01 – 2013-07-03 (×3): 150 mg via ORAL
  Filled 2013-07-01 (×4): qty 1

## 2013-07-01 MED ORDER — MENTHOL 3 MG MT LOZG
1.0000 | LOZENGE | OROMUCOSAL | Status: DC | PRN
  Filled 2013-07-01: qty 9

## 2013-07-01 MED ORDER — SITAGLIPTIN PHOS-METFORMIN HCL 50-1000 MG PO TABS: 1.0000 | ORAL_TABLET | Freq: Two times a day (BID) | ORAL | Status: DC

## 2013-07-01 MED ORDER — SORBITOL 70 % SOLN
30.0000 mL | Freq: Every day | Status: DC | PRN
  Administered 2013-07-04: 30 mL via ORAL
  Filled 2013-07-01: qty 30

## 2013-07-01 MED ORDER — CELECOXIB 200 MG PO CAPS
200.0000 mg | ORAL_CAPSULE | Freq: Two times a day (BID) | ORAL | Status: DC
  Administered 2013-07-01 – 2013-07-04 (×6): 200 mg via ORAL
  Filled 2013-07-01 (×7): qty 1

## 2013-07-01 MED ORDER — DOCUSATE SODIUM 100 MG PO CAPS
100.0000 mg | ORAL_CAPSULE | Freq: Two times a day (BID) | ORAL | Status: DC
  Administered 2013-07-01 – 2013-07-04 (×6): 100 mg via ORAL

## 2013-07-01 MED ORDER — LORATADINE 10 MG PO TABS
10.0000 mg | ORAL_TABLET | Freq: Every day | ORAL | Status: DC
  Administered 2013-07-02 – 2013-07-04 (×3): 10 mg via ORAL
  Filled 2013-07-01 (×3): qty 1

## 2013-07-01 MED ORDER — FENTANYL CITRATE 0.05 MG/ML IJ SOLN
INTRAMUSCULAR | Status: DC | PRN
  Administered 2013-07-01: 50 ug via INTRAVENOUS

## 2013-07-01 MED ORDER — ONDANSETRON HCL 4 MG/2ML IJ SOLN
INTRAMUSCULAR | Status: AC
  Filled 2013-07-01: qty 2

## 2013-07-01 MED ORDER — OXYCODONE HCL 5 MG PO TABS
5.0000 mg | ORAL_TABLET | ORAL | Status: DC | PRN
  Administered 2013-07-01 – 2013-07-02 (×2): 10 mg via ORAL
  Filled 2013-07-01 (×2): qty 2

## 2013-07-01 SURGICAL SUPPLY — 55 items
ADH SKN CLS APL DERMABOND .7 (GAUZE/BANDAGES/DRESSINGS) ×1
BAG SPEC THK2 15X12 ZIP CLS (MISCELLANEOUS)
BAG ZIPLOCK 12X15 (MISCELLANEOUS) ×1 IMPLANT
BANDAGE ELASTIC 6 VELCRO ST LF (GAUZE/BANDAGES/DRESSINGS) ×2 IMPLANT
BANDAGE ESMARK 6X9 LF (GAUZE/BANDAGES/DRESSINGS) ×1 IMPLANT
BLADE SAW SGTL 13.0X1.19X90.0M (BLADE) ×2 IMPLANT
BNDG CMPR 9X6 STRL LF SNTH (GAUZE/BANDAGES/DRESSINGS) ×1
BNDG ESMARK 6X9 LF (GAUZE/BANDAGES/DRESSINGS) ×2
BONE CEMENT GENTAMICIN (Cement) ×4 IMPLANT
BOWL SMART MIX CTS (DISPOSABLE) ×2 IMPLANT
CAPT RP KNEE ×1 IMPLANT
CEMENT BONE GENTAMICIN 40 (Cement) IMPLANT
CUFF TOURN SGL QUICK 34 (TOURNIQUET CUFF) ×2
CUFF TRNQT CYL 34X4X40X1 (TOURNIQUET CUFF) ×1 IMPLANT
DERMABOND ADVANCED (GAUZE/BANDAGES/DRESSINGS) ×1
DERMABOND ADVANCED .7 DNX12 (GAUZE/BANDAGES/DRESSINGS) ×1 IMPLANT
DRAPE EXTREMITY T 121X128X90 (DRAPE) ×2 IMPLANT
DRAPE POUCH INSTRU U-SHP 10X18 (DRAPES) ×2 IMPLANT
DRAPE U-SHAPE 47X51 STRL (DRAPES) ×2 IMPLANT
DRSG AQUACEL AG ADV 3.5X10 (GAUZE/BANDAGES/DRESSINGS) ×2 IMPLANT
DURAPREP 26ML APPLICATOR (WOUND CARE) ×4 IMPLANT
ELECT REM PT RETURN 9FT ADLT (ELECTROSURGICAL) ×2
ELECTRODE REM PT RTRN 9FT ADLT (ELECTROSURGICAL) ×1 IMPLANT
FACESHIELD WRAPAROUND (MASK) ×8 IMPLANT
FACESHIELD WRAPAROUND OR TEAM (MASK) ×5 IMPLANT
GLOVE BIOGEL PI IND STRL 7.5 (GLOVE) ×1 IMPLANT
GLOVE BIOGEL PI IND STRL 8 (GLOVE) ×1 IMPLANT
GLOVE BIOGEL PI INDICATOR 7.5 (GLOVE) ×1
GLOVE BIOGEL PI INDICATOR 8 (GLOVE) ×1
GLOVE ECLIPSE 8.0 STRL XLNG CF (GLOVE) ×2 IMPLANT
GLOVE ORTHO TXT STRL SZ7.5 (GLOVE) ×4 IMPLANT
GOWN STRL REUS W/TWL LRG LVL3 (GOWN DISPOSABLE) ×2 IMPLANT
GOWN STRL REUS W/TWL XL LVL3 (GOWN DISPOSABLE) ×3 IMPLANT
HANDPIECE INTERPULSE COAX TIP (DISPOSABLE) ×2
KIT BASIN OR (CUSTOM PROCEDURE TRAY) ×2 IMPLANT
MANIFOLD NEPTUNE II (INSTRUMENTS) ×2 IMPLANT
NDL SAFETY ECLIPSE 18X1.5 (NEEDLE) ×1 IMPLANT
NEEDLE HYPO 18GX1.5 SHARP (NEEDLE) ×2
NS IRRIG 1000ML POUR BTL (IV SOLUTION) ×2 IMPLANT
PACK TOTAL JOINT (CUSTOM PROCEDURE TRAY) ×2 IMPLANT
POSITIONER SURGICAL ARM (MISCELLANEOUS) ×2 IMPLANT
SET HNDPC FAN SPRY TIP SCT (DISPOSABLE) ×1 IMPLANT
SET PAD KNEE POSITIONER (MISCELLANEOUS) ×2 IMPLANT
SUCTION FRAZIER 12FR DISP (SUCTIONS) ×2 IMPLANT
SUT MNCRL AB 4-0 PS2 18 (SUTURE) ×2 IMPLANT
SUT VIC AB 1 CT1 36 (SUTURE) ×2 IMPLANT
SUT VIC AB 2-0 CT1 27 (SUTURE) ×6
SUT VIC AB 2-0 CT1 TAPERPNT 27 (SUTURE) ×3 IMPLANT
SUT VLOC 180 0 24IN GS25 (SUTURE) ×2 IMPLANT
SYR 50ML LL SCALE MARK (SYRINGE) ×2 IMPLANT
TOWEL OR 17X26 10 PK STRL BLUE (TOWEL DISPOSABLE) ×2 IMPLANT
TOWEL OR NON WOVEN STRL DISP B (DISPOSABLE) ×1 IMPLANT
TRAY FOLEY CATH 14FRSI W/METER (CATHETERS) ×2 IMPLANT
WATER STERILE IRR 1500ML POUR (IV SOLUTION) ×2 IMPLANT
WRAP KNEE MAXI GEL POST OP (GAUZE/BANDAGES/DRESSINGS) ×2 IMPLANT

## 2013-07-01 NOTE — Op Note (Signed)
NAME:  [name]                      MEDICAL RECORD NO.:  [id number]                             FACILITY:  [facility]      PHYSICIAN:  Pietro Cassis. Alvan Dame, M.D.  DATE OF BIRTH:  25-Mar-1950      DATE OF PROCEDURE:  07/01/2013                                     OPERATIVE REPORT         PREOPERATIVE DIAGNOSIS:  Left knee osteoarthritis.      POSTOPERATIVE DIAGNOSIS:  Left knee osteoarthritis.      FINDINGS:  The patient was noted to have complete loss of cartilage and   bone-on-bone arthritis with associated osteophytes in the medial and patellofemoral compartments of   the knee with a significant synovitis and associated effusion.      PROCEDURE:  Left total knee replacement.      COMPONENTS USED:  DePuy rotating platform posterior stabilized knee   system, a size 2.5 femur, 2.5 tibia, 12.5 mm insert, and 38 patellar   button.      SURGEON:  Pietro Cassis. Alvan Dame, M.D.      ASSISTANT:  Nehemiah Massed, PA-C.      ANESTHESIA:  Spinal.      SPECIMENS:  None.      COMPLICATION:  None.      DRAINS:  None  EBL: <100cc      TOURNIQUET TIME:   Total Tourniquet Time Documented: Thigh (Left) - 43 minutes Total: Thigh (Left) - 43 minutes  .      The patient was stable to the recovery room.      INDICATION FOR PROCEDURE:  [name] is a 63 y.o. female patient of   mine.  The patient had been seen, evaluated, and treated conservatively in the   office with medication, activity modification, and injections.  The patient had   radiographic changes of bone-on-bone arthritis with endplate sclerosis and osteophytes noted.      The patient failed conservative measures including medication, injections, and activity modification, and at this point was ready for more definitive measures.   Based on the radiographic changes and failed conservative measures, the patient   decided to proceed with total knee replacement.  Risks of infection,   DVT, component failure, need for revision  surgery, postop course, and   expectations were all   discussed and reviewed.  Consent was obtained for benefit of pain   relief.      PROCEDURE IN DETAIL:  The patient was brought to the operative theater.   Once adequate anesthesia, preoperative antibiotics, 3 gm of Ancef administered, the patient was positioned supine with the left thigh tourniquet placed.  The  left lower extremity was prepped and draped in sterile fashion.  A time-   out was performed identifying the patient, planned procedure, and   extremity.      The left lower extremity was placed in the [facility] leg holder.  The leg was   exsanguinated, tourniquet elevated to 250 mmHg.  A midline incision was   made followed by median parapatellar arthrotomy.  Following initial   exposure, attention was first directed  to the patella.  Precut   measurement was noted to be 22 mm.  I resected down to 14 mm and used a   38 patellar button to restore patellar height as well as cover the cut   surface.      The lug holes were drilled and a metal shim was placed to protect the   patella from retractors and saw blades.      At this point, attention was now directed to the femur.  The femoral   canal was opened with a drill, irrigated to try to prevent fat emboli.  An   intramedullary rod was passed at 3 degrees valgus, 10 mm of bone was   resected off the distal femur.  Following this resection, the tibia was   subluxated anteriorly.  Using the extramedullary guide, 2 mm of bone was resected off   the proximal medial tibia.  We confirmed the gap would be   stable medially and laterally with a 10 mm insert as well as confirmed   the cut was perpendicular in the coronal plane, checking with an alignment rod.      Once this was done, I sized the femur to be a size 2.5 in the anterior-   posterior dimension, chose a standard component based on medial and   lateral dimension.  The size 2.5 rotation block was then pinned in   position  anterior referenced using the C-clamp to set rotation.  The   anterior, posterior, and  chamfer cuts were made without difficulty nor   notching making certain that I was along the anterior cortex to help   with flexion gap stability.      The final box cut was made off the lateral aspect of distal femur.      At this point, the tibia was sized to be a size 2.5, the size 2.5 tray was   then pinned in position through the medial third of the tubercle,   drilled, and keel punched.  Trial reduction was now carried with a 2.5 femur,  2.5 tibia, a 10 then 12.5 mm insert, and the 38 patella botton.  The knee was brought to   extension, full extension with good flexion stability with the patella   tracking through the trochlea without application of pressure.  Given   all these findings, the trial components removed.  Final components were   opened and cement was mixed.  The knee was irrigated with normal saline   solution and pulse lavage.  The synovial lining was   then injected with 20cc of Exparel, 30cc of 0.25% Marcaine with epinephrine and 1 cc of Toradol,   total of 61 cc.      The knee was irrigated.  Final implants were then cemented onto clean and   dried cut surfaces of bone with the knee brought to extension with a 12.5 mm trial insert.      Once the cement had fully cured, the excess cement was removed   throughout the knee.  I confirmed I was satisfied with the range of   motion and stability, and the final 12.5 mm PS insert was chosen.  It was   placed into the knee.      The tourniquet had been let down at 42 minutes.  No significant   hemostasis required.  The   extensor mechanism was then reapproximated using #1 Vicryl and #0 V-lock sutures with the knee   in flexion.  The  remaining wound was closed with 2-0 Vicryl and running 4-0 Monocryl.   The knee was cleaned, dried, dressed sterilely using Dermabond and   Aquacel dressing.  The patient was then   brought to  recovery room in stable condition, tolerating the procedure   well.   Please note that Physician Assistant, Nehemiah Massed, PA-C, was present for the entirety of the case, and was utilized for pre-operative positioning, peri-operative retractor management, general facilitation of the procedure.  He was also utilized for primary wound closure at the end of the case.              Pietro Cassis Alvan Dame, M.D.    07/01/2013 2:58 PM

## 2013-07-01 NOTE — Transfer of Care (Signed)
Immediate Anesthesia Transfer of Care Note  Patient: [name]  Procedure(s) Performed: Procedure(s): LEFT TOTAL KNEE ARTHROPLASTY (Left)  Patient Location: PACU  Anesthesia Type:Spinal  Level of Consciousness: awake, alert , oriented and patient cooperative  Airway & Oxygen Therapy: Patient Spontanous Breathing and Patient connected to face mask oxygen  Post-op Assessment: Report given to PACU RN and Post -op Vital signs reviewed and stable  Post vital signs: Reviewed and stable  Complications: No apparent anesthesia complications

## 2013-07-01 NOTE — Anesthesia Preprocedure Evaluation (Signed)
Anesthesia Evaluation  Patient identified by MRN, date of birth, ID band Patient awake    Reviewed: Allergy & Precautions, H&P , NPO status , Patient's Chart, lab work & pertinent test results  History of Anesthesia Complications (+) PONV and history of anesthetic complications  Airway Mallampati: II TM Distance: >3 FB Neck ROM: Full    Dental no notable dental hx.    Pulmonary neg pulmonary ROS, sleep apnea , former smoker,  breath sounds clear to auscultation  Pulmonary exam normal       Cardiovascular Exercise Tolerance: Good hypertension, Pt. on medications Rhythm:Regular Rate:Normal     Neuro/Psych negative neurological ROS  negative psych ROS   GI/Hepatic negative GI ROS, Neg liver ROS,   Endo/Other  negative endocrine ROSdiabetes, Type 2, Oral Hypoglycemic AgentsHypothyroidism Morbid obesity  Renal/GU negative Renal ROS  negative genitourinary   Musculoskeletal negative musculoskeletal ROS (+)   Abdominal (+) + obese,   Peds negative pediatric ROS (+)  Hematology negative hematology ROS (+)   Anesthesia Other Findings   Reproductive/Obstetrics negative OB ROS                           Anesthesia Physical Anesthesia Plan  ASA: III  Anesthesia Plan: Spinal   Post-op Pain Management:    Induction: Intravenous  Airway Management Planned:   Additional Equipment:   Intra-op Plan:   Post-operative Plan:   Informed Consent: I have reviewed the patients History and Physical, chart, labs and discussed the procedure including the risks, benefits and alternatives for the proposed anesthesia with the patient or authorized representative who has indicated his/her understanding and acceptance.   Dental advisory given  Plan Discussed with: CRNA  Anesthesia Plan Comments: (Discussed general and spinal. Discussed risks/benefits of spinal including headache, backache, failure,  bleeding, infection, and nerve damage. Patient consents to spinal. Questions answered. Coagulation studies and platelet count acceptable.  Patient requests scopolamine patch which she has used in the past with good results.)        Anesthesia Quick Evaluation

## 2013-07-01 NOTE — Progress Notes (Signed)
Pt placed on CPAP set at 14 CMH2O per home settings via nasal mask with 2 LPM O2 bleed in, pt tolerating well at this time, RT to monitor and assess as needed.

## 2013-07-01 NOTE — Anesthesia Procedure Notes (Signed)
Spinal  Patient location during procedure: OR Start time: 07/01/2013 1:15 PM End time: 07/01/2013 1:19 PM Staffing CRNA/Resident: Anne Fu Performed by: resident/CRNA  Preanesthetic Checklist Completed: patient identified, site marked, surgical consent, pre-op evaluation, timeout performed, IV checked, risks and benefits discussed and monitors and equipment checked Spinal Block Patient position: sitting Prep: Betadine Patient monitoring: heart rate, continuous pulse ox and blood pressure Approach: right paramedian Location: L2-3 Injection technique: single-shot Needle Needle type: Sprotte  Needle gauge: 24 G Needle length: 9 cm Assessment Sensory level: T4 Additional Notes Expiration date of kit checked and confirmed. Patient tolerated procedure well, without complications. X 1 attempt with clear CSF return rotated needle 90 degrees. to improve aspiration and administration of medication.  Noted loss of sensory and motor on exam post spinal placement.

## 2013-07-01 NOTE — Anesthesia Postprocedure Evaluation (Signed)
  Anesthesia Post-op Note  Patient: [name]  Procedure(s) Performed: Procedure(s) (LRB): LEFT TOTAL KNEE ARTHROPLASTY (Left)  Patient Location: PACU  Anesthesia Type: Spinal  Level of Consciousness: awake and alert   Airway and Oxygen Therapy: Patient Spontanous Breathing  Post-op Pain: mild  Post-op Assessment: Post-op Vital signs reviewed, Patient's Cardiovascular Status Stable, Respiratory Function Stable, Patent Airway and No signs of Nausea or vomiting  Last Vitals:  Filed Vitals:   07/01/13 1850  BP: 145/71  Pulse: 58  Temp: 36.3 C  Resp: 16    Post-op Vital Signs: stable   Complications: No apparent anesthesia complications

## 2013-07-01 NOTE — Interval H&P Note (Signed)
History and Physical Interval Note:  07/01/2013 12:22 PM  [name]  has presented today for surgery, with the diagnosis of LEFT KNEE OA  The various methods of treatment have been discussed with the patient and family. After consideration of risks, benefits and other options for treatment, the patient has consented to  Procedure(s): LEFT TOTAL KNEE ARTHROPLASTY (Left) as a surgical intervention .  The patient's history has been reviewed, patient examined, no change in status, stable for surgery.  I have reviewed the patient's chart and labs.  Questions were answered to the patient's satisfaction.     Mauri Pole

## 2013-07-01 NOTE — Plan of Care (Signed)
Problem: Consults Goal: Diagnosis- Total Joint Replacement Left total knee     

## 2013-07-02 LAB — CBC
HCT: 34.3 % — ABNORMAL LOW (ref 36.0–46.0)
Hemoglobin: 10.8 g/dL — ABNORMAL LOW (ref 12.0–15.0)
MCH: 27.8 pg (ref 26.0–34.0)
MCHC: 31.5 g/dL (ref 30.0–36.0)
MCV: 88.4 fL (ref 78.0–100.0)
PLATELETS: 200 10*3/uL (ref 150–400)
RBC: 3.88 MIL/uL (ref 3.87–5.11)
RDW: 15.7 % — AB (ref 11.5–15.5)
WBC: 7.8 10*3/uL (ref 4.0–10.5)

## 2013-07-02 LAB — BASIC METABOLIC PANEL
BUN: 26 mg/dL — AB (ref 6–23)
CHLORIDE: 101 meq/L (ref 96–112)
CO2: 21 mEq/L (ref 19–32)
Calcium: 8.2 mg/dL — ABNORMAL LOW (ref 8.4–10.5)
Creatinine, Ser: 1.16 mg/dL — ABNORMAL HIGH (ref 0.50–1.10)
GFR calc non Af Amer: 49 mL/min — ABNORMAL LOW (ref >90)
GFR, EST AFRICAN AMERICAN: 56 mL/min — AB (ref >90)
Glucose, Bld: 144 mg/dL — ABNORMAL HIGH (ref 70–99)
POTASSIUM: 4.8 meq/L (ref 3.7–5.3)
Sodium: 134 mEq/L — ABNORMAL LOW (ref 137–147)

## 2013-07-02 LAB — HEMOGLOBIN A1C
HEMOGLOBIN A1C: 6.9 % — AB (ref <5.7)
Mean Plasma Glucose: 151 mg/dL — ABNORMAL HIGH (ref <117)

## 2013-07-02 LAB — GLUCOSE, CAPILLARY
GLUCOSE-CAPILLARY: 123 mg/dL — AB (ref 70–99)
Glucose-Capillary: 151 mg/dL — ABNORMAL HIGH (ref 70–99)
Glucose-Capillary: 163 mg/dL — ABNORMAL HIGH (ref 70–99)

## 2013-07-02 MED ORDER — OXYCODONE HCL 5 MG PO TABS
5.0000 mg | ORAL_TABLET | ORAL | Status: DC
  Administered 2013-07-02 – 2013-07-03 (×5): 10 mg via ORAL
  Administered 2013-07-03: 5 mg via ORAL
  Administered 2013-07-03: 15 mg via ORAL
  Administered 2013-07-03 (×2): 10 mg via ORAL
  Administered 2013-07-03 – 2013-07-04 (×5): 15 mg via ORAL
  Filled 2013-07-02 (×2): qty 2
  Filled 2013-07-02 (×3): qty 3
  Filled 2013-07-02: qty 1
  Filled 2013-07-02 (×4): qty 2
  Filled 2013-07-02 (×2): qty 3
  Filled 2013-07-02: qty 2
  Filled 2013-07-02: qty 3

## 2013-07-02 NOTE — Progress Notes (Signed)
Clinical Social Work Department BRIEF PSYCHOSOCIAL ASSESSMENT 07/02/2013  Patient:  [name], [name]     Account Number:  [id number]     Admit date:  07/01/2013  Clinical Social Worker:  Earlie Server  Date/Time:  07/02/2013 01:00 PM  Referred by:  Physician  Date Referred:  07/02/2013 Referred for  SNF Placement   Other Referral:   Interview type:  Patient Other interview type:    PSYCHOSOCIAL DATA Living Status:  FAMILY Admitted from facility:   Level of care:   Primary support name:  Ronny Primary support relationship to patient:  SPOUSE Degree of support available:   Adequate    CURRENT CONCERNS Current Concerns  Post-Acute Placement   Other Concerns:    SOCIAL WORK ASSESSMENT / PLAN CSW received referral in order to assist with DC planning. Per chart review, PT recommending SNF at DC. CSW met with patient at bedside and introduced myself and explained role.    Patient reports that she lives at home with husband but he will not be able to assist much at DC. Patient has a sister that also cares for her parents so she does not want to ask her for help. Patient is interested in [place] [facility] but has not been placed before. CSW explained SNF process and patient is most interested in [place] because her parents are residents there. Patient requested [place] search in order to have alternative options.    CSW completed FL2 and faxed out. CSW left a message with [facility] re: placement. CSW will follow up with bed offers.   Assessment/plan status:  Psychosocial Support/Ongoing Assessment of Needs Other assessment/ plan:   Information/referral to community resources:   SNF list    PATIENT'S/FAMILY'S RESPONSE TO PLAN OF CARE: Patient alert and oriented. Patient reports that she does not want to feel like a burden and thinks if she rehabs prior to going home then she will be more successful. Patient reports that she wants to be close to parents at  [place] but understanding that SNF has to be in-network with her insurance. Patient thanked CSW for information.       Sindy Messing, LCSW (Coverage for [employer])

## 2013-07-02 NOTE — Care Management Note (Addendum)
    Page 1 of 1   07/04/2013     11:43:25 AM CARE MANAGEMENT NOTE 07/04/2013  Patient:  MISAO, FACKRELL   Account Number:  [id number]  Date Initiated:  07/02/2013  Documentation initiated by:  [facility]  Subjective/Objective Assessment:   adm: L knee O/A pain; L TOTAL KNEE ARTHROPLASTY     Action/Plan:   discharge planning   Anticipated DC Date:  07/03/2013   Anticipated DC Plan:  [place]  In-house referral  Clinical Social Worker      DC Planning Services  CM consult      Choice offered to / List presented to:             Status of service:  Completed, signed off Medicare Important Message given?   (If response is "NO", the following Medicare IM given date fields will be blank) Date Medicare IM given:   Date Additional Medicare IM given:    Discharge Disposition:  [place]  Per UR Regulation:  Reviewed for med. necessity/level of care/duration of stay  If discussed at [place] of Stay Meetings, dates discussed:    Comments:  07/02/13 08:30 CM met with pt and pt's sister in room to offer choice for HHPT.  Pt states she would like to speak with CSW for SNF for rehab as she has no one to help her at home.  CM placed call to CSW for possible placement.  Will continue to monitor for disposition.  Mariane Masters, BSN, CM [phone].

## 2013-07-02 NOTE — Progress Notes (Signed)
Physical Therapy Treatment Patient Details Name: [name] MRN: [id number] DOB: Apr 08, 1950 Today's Date: 2013-07-06    History of Present Illness L TKR    PT Comments    Pt s/p L TKR presents with decreased L LE strength/ROM and post op pain limiting functional mobility.  Pt would benefit from follow up rehab at SNF level to maximize IND for return home with ltd assist.  Follow Up Recommendations  SNF     Equipment Recommendations  Rolling walker with 5" wheels    Recommendations for Other Services OT consult     Precautions / Restrictions Precautions Precautions: Knee Restrictions Weight Bearing Restrictions: No Other Position/Activity Restrictions: WBAT    Mobility  Bed Mobility Overal bed mobility: Needs Assistance Bed Mobility: Supine to Sit     Supine to sit: Min assist     General bed mobility comments: cues for sequence and use of R LE to self assist  Transfers Overall transfer level: Needs assistance Equipment used: Rolling walker (2 wheeled) Transfers: Sit to/from Stand Sit to Stand: Min assist;Mod assist         General transfer comment: cues for LE management and use of UEs to self assist  Ambulation/Gait Ambulation/Gait assistance: Min assist Ambulation Distance (Feet): 35 Feet Assistive device: Rolling walker (2 wheeled) Gait Pattern/deviations: Step-to pattern;Decreased step length - right;Decreased step length - left;Shuffle;Trunk flexed     General Gait Details: cues for sequence, posture and position from [employer]            Wheelchair Mobility    Modified Rankin (Stroke Patients Only)       Balance                                    Cognition Arousal/Alertness: Awake/alert Behavior During Therapy: WFL for tasks assessed/performed Overall Cognitive Status: Within Functional Limits for tasks assessed                      Exercises Total Joint Exercises Ankle Circles/Pumps: AROM;Both;15  reps;Supine Quad Sets: AROM;Both;10 reps;Supine Heel Slides: AAROM;Left;15 reps;Supine Straight Leg Raises: AAROM;AROM;Left;10 reps;Supine    General Comments        Pertinent Vitals/Pain 7/10; premed, cold packs provided    Home Living Family/patient expects to be discharged to:: Skilled nursing facility Living Arrangements: Spouse/significant other                  Prior Function Level of Independence: Independent          PT Goals (current goals can now be found in the care plan section) Acute Rehab PT Goals Patient Stated Goal: Rehab and home to resume previous lifestyle with decreased pain PT Goal Formulation: With patient Time For Goal Achievement: 07/09/13 Potential to Achieve Goals: Good    Frequency  7X/week    PT Plan      Co-evaluation             End of Session Equipment Utilized During Treatment: Gait belt Activity Tolerance: Patient tolerated treatment well Patient left: in chair;with call bell/phone within reach     Time: 5784-6962 PT Time Calculation (min): 28 min  Charges:  $Gait Training: 8-22 mins $Therapeutic Exercise: 8-22 mins                    G Codes:      Mathis Fare Jul 06, 2013, 12:56 PM

## 2013-07-02 NOTE — Progress Notes (Signed)
Clinical Social Work Department CLINICAL SOCIAL WORK PLACEMENT NOTE 07/02/2013  Patient:  [name], [name]  Account Number:  [id number] Admit date:  07/01/2013  Clinical Social Worker:  Sindy Messing, LCSW  Date/time:  07/02/2013 01:00 PM  Clinical Social Work is seeking post-discharge placement for this patient at the following level of care:   [place]   (*CSW will update this form in Epic as items are completed)   07/02/2013  Patient/family provided with [place] Department of Clinical Social Work's list of facilities offering this level of care within the geographic area requested by the patient (or if unable, by the patient's family).  07/02/2013  Patient/family informed of their freedom to choose among providers that offer the needed level of care, that participate in Medicare, Medicaid or managed care program needed by the patient, have an available bed and are willing to accept the patient.  07/02/2013  Patient/family informed of [facility]' ownership interest in [facility], as well as of the fact that they are under no obligation to receive care at this facility.  PASARR submitted to EDS on 07/02/2013 PASARR number received from EDS on 07/02/2013  FL2 transmitted to all facilities in geographic area requested by pt/family on  07/02/2013 FL2 transmitted to all facilities within larger geographic area on   Patient informed that his/her managed care company has contracts with or will negotiate with  certain facilities, including the following:     Patient/family informed of bed offers received:   Patient chooses bed at  Physician recommends and patient chooses bed at    Patient to be transferred to  on   Patient to be transferred to facility by   The following physician request were entered in Epic:   Additional Comments:

## 2013-07-02 NOTE — Progress Notes (Signed)
   Subjective: 1 Day Post-Op Procedure(s) (LRB): LEFT TOTAL KNEE ARTHROPLASTY (Left)   Patient reports pain as mild, pain controlled with medication. No events throughout the night. Doesn't have a good support system to help her at home and will need skilled nursing facility when discharged.  Objective:   VITALS:   Filed Vitals:   07/02/13 0949  BP: 114/71  Pulse: 52  Temp: 98.2 F (36.8 C)  Resp: 20    Neurovascular intact Dorsiflexion/Plantar flexion intact Incision: dressing C/D/I No cellulitis present Compartment soft  LABS  Recent Labs  07/02/13 0525  HGB 10.8*  HCT 34.3*  WBC 7.8  PLT 200     Recent Labs  07/02/13 0525  NA 134*  K 4.8  BUN 26*  CREATININE 1.16*  GLUCOSE 144*     Assessment/Plan: 1 Day Post-Op Procedure(s) (LRB): LEFT TOTAL KNEE ARTHROPLASTY (Left) ACE bandage removed. Up with therapy Discharge to SNF  Expected ABLA  Treated with iron and will observe  Morbid Obesity (BMI >40)  Estimated body mass index is 45.64 kg/(m^2) as calculated from the following:   Height as of this encounter: 5\' 4"  (1.626 m).   Weight as of this encounter: 120.657 kg (266 lb). Patient also counseled that weight may inhibit the healing process Patient counseled that losing weight will help with future health issues     [place]. Hyland Mollenkopf   PAC  07/02/2013, 10:17 AM

## 2013-07-02 NOTE — Progress Notes (Signed)
Utilization review completed.  

## 2013-07-02 NOTE — Progress Notes (Signed)
Physical Therapy Treatment Patient Details Name: [name] MRN: [id number] DOB: May 26, 1950 Today's Date: 07/15/2013    History of Present Illness L TKR    PT Comments      Follow Up Recommendations  SNF     Equipment Recommendations  Rolling walker with 5" wheels    Recommendations for Other Services OT consult     Precautions / Restrictions Precautions Precautions: Knee Restrictions Weight Bearing Restrictions: No Other Position/Activity Restrictions: WBAT    Mobility  Bed Mobility Overal bed mobility: Needs Assistance Bed Mobility: Supine to Sit;Sit to Supine     Supine to sit: Min assist Sit to supine: Min assist   General bed mobility comments: cues for sequence and use of R LE to self assist  Transfers Overall transfer level: Needs assistance Equipment used: Rolling walker (2 wheeled) Transfers: Sit to/from Stand Sit to Stand: Min assist;Mod assist         General transfer comment: cues for LE management and use of UEs to self assist  Ambulation/Gait Ambulation/Gait assistance: Min assist Ambulation Distance (Feet): 52 Feet (twice) Assistive device: Rolling walker (2 wheeled) Gait Pattern/deviations: Step-to pattern;Decreased step length - right;Decreased step length - left;Shuffle;Trunk flexed     General Gait Details: cues for sequence, posture and position from [employer]            Wheelchair Mobility    Modified Rankin (Stroke Patients Only)       Balance                                    Cognition Arousal/Alertness: Awake/alert Behavior During Therapy: WFL for tasks assessed/performed Overall Cognitive Status: Within Functional Limits for tasks assessed                      Exercises      General Comments        Pertinent Vitals/Pain 6/10; premed, ice packs provided, pt requesting MR    Home Living                      Prior Function            PT Goals (current goals  can now be found in the care plan section) Acute Rehab PT Goals Patient Stated Goal: Rehab and home to resume previous lifestyle with decreased pain PT Goal Formulation: With patient Time For Goal Achievement: 07/09/13 Potential to Achieve Goals: Good Progress towards PT goals: Progressing toward goals    Frequency  7X/week    PT Plan Current plan remains appropriate    Co-evaluation             End of Session Equipment Utilized During Treatment: Gait belt Activity Tolerance: Patient tolerated treatment well Patient left: with call bell/phone within reach;in bed     Time: 7371-0626 PT Time Calculation (min): 16 min  Charges:  $Gait Training: 8-22 mins                    G Codes:      [name] Jul 15, 2013, 3:08 PM

## 2013-07-03 ENCOUNTER — Encounter (HOSPITAL_COMMUNITY): Payer: Self-pay | Admitting: Orthopedic Surgery

## 2013-07-03 DIAGNOSIS — D500 Iron deficiency anemia secondary to blood loss (chronic): Secondary | ICD-10-CM | POA: Diagnosis not present

## 2013-07-03 LAB — GLUCOSE, CAPILLARY
GLUCOSE-CAPILLARY: 113 mg/dL — AB (ref 70–99)
GLUCOSE-CAPILLARY: 173 mg/dL — AB (ref 70–99)
Glucose-Capillary: 201 mg/dL — ABNORMAL HIGH (ref 70–99)
Glucose-Capillary: 174 mg/dL — ABNORMAL HIGH (ref 70–99)
Glucose-Capillary: 162 mg/dL — ABNORMAL HIGH (ref 70–99)

## 2013-07-03 LAB — BASIC METABOLIC PANEL
BUN: 23 mg/dL (ref 6–23)
CHLORIDE: 101 meq/L (ref 96–112)
CO2: 26 mEq/L (ref 19–32)
CREATININE: 1.19 mg/dL — AB (ref 0.50–1.10)
Calcium: 8.8 mg/dL (ref 8.4–10.5)
GFR calc Af Amer: 55 mL/min — ABNORMAL LOW (ref >90)
GFR calc non Af Amer: 47 mL/min — ABNORMAL LOW (ref >90)
GLUCOSE: 192 mg/dL — AB (ref 70–99)
POTASSIUM: 4.8 meq/L (ref 3.7–5.3)
Sodium: 137 mEq/L (ref 137–147)

## 2013-07-03 LAB — CBC
HEMATOCRIT: 33.9 % — AB (ref 36.0–46.0)
HEMOGLOBIN: 11 g/dL — AB (ref 12.0–15.0)
MCH: 28.4 pg (ref 26.0–34.0)
MCHC: 32.4 g/dL (ref 30.0–36.0)
MCV: 87.6 fL (ref 78.0–100.0)
Platelets: 209 10*3/uL (ref 150–400)
RBC: 3.87 MIL/uL (ref 3.87–5.11)
RDW: 15.5 % (ref 11.5–15.5)
WBC: 9.2 10*3/uL (ref 4.0–10.5)

## 2013-07-03 MED ORDER — POLYETHYLENE GLYCOL 3350 17 G PO PACK: 17.0000 g | PACK | Freq: Every day | ORAL | Status: DC | PRN

## 2013-07-03 MED ORDER — ALPRAZOLAM 0.5 MG PO TABS: 0.5000 mg | ORAL_TABLET | Freq: Three times a day (TID) | ORAL | Status: DC | PRN

## 2013-07-03 MED ORDER — FERROUS SULFATE 325 (65 FE) MG PO TABS: 325.0000 mg | ORAL_TABLET | Freq: Three times a day (TID) | ORAL | Status: DC

## 2013-07-03 MED ORDER — METHOCARBAMOL 500 MG PO TABS: 500.0000 mg | ORAL_TABLET | Freq: Four times a day (QID) | ORAL | Status: DC | PRN

## 2013-07-03 MED ORDER — OXYCODONE HCL 5 MG PO TABS: 5.0000 mg | ORAL_TABLET | ORAL | Status: DC | PRN

## 2013-07-03 MED ORDER — ASPIRIN 325 MG PO TBEC
325.0000 mg | DELAYED_RELEASE_TABLET | Freq: Two times a day (BID) | ORAL | Status: AC
Start: 2013-07-03 — End: 2013-07-31

## 2013-07-03 MED ORDER — DSS 100 MG PO CAPS: 100.0000 mg | ORAL_CAPSULE | Freq: Two times a day (BID) | ORAL | Status: DC

## 2013-07-03 NOTE — Progress Notes (Signed)
Physical Therapy Treatment Patient Details Name: SESILIA POUCHER MRN: [id number] DOB: Nov 23, 1950 Today's Date: 07/03/2013    History of Present Illness L TKR    PT Comments    POD # 2 pm session.  Assisted pt OOB to amb to BR then amb a limited distance in hallway due to increased c/o "soreness".  Assisted back to bed to perform TKR TE's.  Pt progressing slowly and will need ST Rehab at SNF.    Follow Up Recommendations  SNF     Equipment Recommendations       Recommendations for Other Services       Precautions / Restrictions Precautions Precautions: Knee Restrictions Weight Bearing Restrictions: No Other Position/Activity Restrictions: WBAT    Mobility  Bed Mobility Overal bed mobility: Needs Assistance Bed Mobility: Sit to Supine;Supine to Sit     Supine to sit: Min assist Sit to supine: Min assist   General bed mobility comments: Min Assist to support L LE up onto bed and increased time.    Transfers Overall transfer level: Needs assistance Equipment used: Rolling walker (2 wheeled) Transfers: Sit to/from Stand Sit to Stand: Min assist         General transfer comment: verbal cues for hand placement and L LE management. plus increased time.  Ambulation/Gait Ambulation/Gait assistance: Min assist Ambulation Distance (Feet): 25 Feet Assistive device: Rolling walker (2 wheeled) Gait Pattern/deviations: Step-to pattern Gait velocity: decreased   General Gait Details: decreased amb distance due to increased c/o pain and fatigue.     Stairs            Wheelchair Mobility    Modified Rankin (Stroke Patients Only)       Balance                                    Cognition Arousal/Alertness: Awake/alert Behavior During Therapy: WFL for tasks assessed/performed Overall Cognitive Status: Within Functional Limits for tasks assessed                      Exercises   Total Knee Replacement TE's 10 reps B LE ankle  pumps 10 reps heel slides  10 reps SLR's 10 reps ABD Followed by ICE    General Comments        Pertinent Vitals/Pain C/o 7/10  ICE applied    Home Living Family/patient expects to be discharged to:: Skilled nursing facility Living Arrangements: Spouse/significant other                  Prior Function Level of Independence: Independent          PT Goals (current goals can now be found in the care plan section) Acute Rehab PT Goals Patient Stated Goal: Rehab and home to resume previous lifestyle with decreased pain Progress towards PT goals: Progressing toward goals    Frequency  7X/week    PT Plan      Co-evaluation             End of Session Equipment Utilized During Treatment: Gait belt Activity Tolerance: Patient limited by fatigue;Patient limited by pain Patient left: in bed;with call bell/phone within reach     Time: 1351-1435 PT Time Calculation (min): 44 min  Charges:  $Gait Training: 8-22 mins $Therapeutic Exercise: 8-22 mins $Therapeutic Activity: 8-22 mins  G Codes:      Rica Koyanagi  PTA [facility]  Acute  Rehab Pager      [phone]

## 2013-07-03 NOTE — Progress Notes (Signed)
Physical Therapy Treatment Patient Details Name: [name] MRN: [id number] DOB: 24-Nov-1950 Today's Date: 07/03/2013    History of Present Illness L TKR    PT Comments    POD # 2 am session.  Pt already OOB in recliner.  Assisted with amb decreased distance this session due to increased c/o knee pain and fatigue.  Assisted back to bed to perform TKR TE's.  Pt progressing slowly and will need ST Rehab at SNF.  Follow Up Recommendations  SNF     Equipment Recommendations       Recommendations for Other Services       Precautions / Restrictions Precautions Precautions: Knee Restrictions Weight Bearing Restrictions: No Other Position/Activity Restrictions: WBAT    Mobility  Bed Mobility Overal bed mobility: Needs Assistance Bed Mobility: Sit to Supine     Supine to sit: Min assist Sit to supine: Min assist   General bed mobility comments: Min Assist to support L LE up onto bed and increased time.    Transfers Overall transfer level: Needs assistance Equipment used: Rolling walker (2 wheeled) Transfers: Sit to/from Stand Sit to Stand: Min assist         General transfer comment: verbal cues for hand placement and L LE management. plus increased time.  Ambulation/Gait Ambulation/Gait assistance: Min assist Ambulation Distance (Feet): 32 Feet Assistive device: Rolling walker (2 wheeled) Gait Pattern/deviations: Step-to pattern;Decreased stance time - left Gait velocity: decreased   General Gait Details: decreased amb distance due to increased c/o pain and fatigue.     Stairs            Wheelchair Mobility    Modified Rankin (Stroke Patients Only)       Balance                                    Cognition Arousal/Alertness: Awake/alert Behavior During Therapy: WFL for tasks assessed/performed Overall Cognitive Status: Within Functional Limits for tasks assessed                      Exercises   Total Knee  Replacement TE's 10 reps B LE ankle pumps 10 reps towel squeezes 10 reps knee presses 10 reps heel slides  10 reps SAQ's 10 reps SLR's 10 reps ABD Followed by ICE    General Comments        Pertinent Vitals/Pain C/o 7/10 pain meds requested ICE applied    Home Living Family/patient expects to be discharged to:: Skilled nursing facility Living Arrangements: Spouse/significant other                  Prior Function Level of Independence: Independent          PT Goals (current goals can now be found in the care plan section) Acute Rehab PT Goals Patient Stated Goal: Rehab and home to resume previous lifestyle with decreased pain Progress towards PT goals: Progressing toward goals    Frequency  7X/week    PT Plan      Co-evaluation             End of Session Equipment Utilized During Treatment: Gait belt Activity Tolerance: Patient limited by fatigue;Patient limited by pain Patient left: in bed;with call bell/phone within reach     Time: 1015-1100 PT Time Calculation (min): 45 min  Charges:  $Gait Training: 8-22 mins $Therapeutic Exercise: 8-22 mins $Therapeutic Activity: 8-22 mins  G Codes:     Rica Koyanagi  PTA [facility]  Acute  Rehab Pager      [phone]

## 2013-07-03 NOTE — Plan of Care (Signed)
Problem: Consults Goal: Diagnosis- Total Joint Replacement Outcome: Completed/Met Date Met:  07/03/13 Primary Total Knee LEFT  Problem: Phase III Progression Outcomes Goal: Anticoagulant follow-up in place Outcome: Not Applicable Date Met:  89/78/47 ASA for VTE, no f/u needed.

## 2013-07-03 NOTE — Discharge Summary (Signed)
Physician Discharge Summary  Patient ID: [name] MRN: [id number] DOB/AGE: 10/15/1950 63 y.o.  Admit date: 07/01/2013 Discharge date:  07/04/2013  Procedures:  Procedure(s) (LRB): LEFT TOTAL KNEE ARTHROPLASTY (Left)  Attending Physician:  Dr. Paralee Cancel   Admission Diagnoses:   Left knee OA / pain  Discharge Diagnoses:  Active Problems:   Morbid obesity   DJD (degenerative joint disease) of knee   Expected blood loss anemia  Past Medical History  Diagnosis Date  . Goiter     h/o  . Hypothyroidism     w/ h/o goiter  . Morbid obesity   . Dyslipidemia   . Hypertension   . Osteoarthritis   . Depression   . Diabetes mellitus     has diarrhea from medicine  . Bradycardia   . Complication of anesthesia     HR dropped to 20  . PONV (postoperative nausea and vomiting)   . Seasonal allergies   . Sleep apnea     on CPAP    HPI: [name], 63 y.o. female, has a history of pain and functional disability in the left knee due to arthritis and has failed non-surgical conservative treatments for greater than 12 weeks to includeNSAID's and/or analgesics, corticosteriod injections, use of assistive devices and activity modification. Onset of symptoms was gradual, starting 5+ years ago with gradually worsening course since that time. The patient noted prior procedures on the knee to include arthroplasty on the right knee per Dr. Alvan Dame about 5 years ago. Patient currently rates pain in the left knee(s) at 10 out of 10 with activity. Patient has worsening of pain with activity and weight bearing, pain that interferes with activities of daily living, pain with passive range of motion, crepitus and joint swelling. Patient has evidence of periarticular osteophytes and joint space narrowing by imaging studies. There is no active infection. Risks, benefits and expectations were discussed with the patient. Risks including but not limited to the risk of anesthesia, blood clots, nerve  damage, blood vessel damage, failure of the prosthesis, infection and up to and including death. Patient understand the risks, benefits and expectations and wishes to proceed with surgery.   PCP: Kathlene November, MD   Discharged Condition: good  Hospital Course:  Patient underwent the above stated procedure on 07/01/2013. Patient tolerated the procedure well and brought to the recovery room in good condition and subsequently to the floor.  POD #1 BP: 114/71 ; Pulse: 52 ; Temp: 98.2 F (36.8 C) ; Resp: 20  Patient reports pain as mild, pain controlled with medication. No events throughout the night. Doesn't have a good support system to help her at home and will need skilled nursing facility when discharged. Neurovascular intact, dorsiflexion/plantar flexion intact, incision: dressing C/D/I, no cellulitis present and compartment soft.   LABS  Basename    HGB  10.8  HCT  34.3   POD #2  BP: 146/77 ; Pulse: 55 ; Temp: 98.7 F (37.1 C) ; Resp: 16  Patient reports pain as mild, pain controlled. No events throughout the night.  Neurovascular intact, dorsiflexion/plantar flexion intact, incision: dressing C/D/I, no cellulitis present and compartment soft.   LABS  Basename    HGB  11.0  HCT  33.9   POD #3  BP: 128/64 ; Pulse: 66 ; Temp: 98.6 F (37 C) ; Resp: 18  Patient reports pain as mild, pain controlled. Feels that the pain is better today than it has been. No events throughout the night.  Denies any CP of SOB. Ready to be discharged to skilled nursing facility. Neurovascular intact, dorsiflexion/plantar flexion intact, incision: dressing C/D/I, no cellulitis present and compartment soft.   LABS  Basename    HGB  10.8  HCT  33.4   Discharge Exam: General appearance: alert, cooperative and no distress Extremities: Homans sign is negative, no sign of DVT, no edema, redness or tenderness in the calves or thighs and no ulcers, gangrene or trophic changes  Disposition:    Skilled nursing  facility with follow up in 2 weeks   Follow-up Information   Follow up with Mauri Pole, MD. Schedule an appointment as soon as possible for a visit in 2 weeks.   Specialty:  Orthopedic Surgery   Contact information:   [address] [place] [address] [phone]       Discharge Orders   Future Appointments Provider Department Dept Phone   11/21/2013 4:00 PM Kathee Delton, MD [facility] [phone]   Future Orders Complete By Expires   Call MD / Call 911  As directed    Change dressing  As directed    Constipation Prevention  As directed    Diet - low sodium heart healthy  As directed    Discharge instructions  As directed    Increase activity slowly as tolerated  As directed    TED hose  As directed    Weight bearing as tolerated  As directed    Laterality:  Left   Extremity:  Lower        Medication List    STOP taking these medications       naproxen sodium 220 MG tablet  Commonly known as:  ANAPROX      TAKE these medications       ALPRAZolam 0.5 MG tablet  Commonly known as:  XANAX  Take 1 tablet (0.5 mg total) by mouth 3 (three) times daily as needed for anxiety.     aspirin 325 MG EC tablet  Take 1 tablet (325 mg total) by mouth 2 (two) times daily.     benazepril 40 MG tablet  Commonly known as:  LOTENSIN  Take 40 mg by mouth at bedtime.     CALCIUM + D PO  Take 1 tablet by mouth daily.     cetirizine 10 MG tablet  Commonly known as:  ZYRTEC  Take 10 mg by mouth daily.     DSS 100 MG Caps  Take 100 mg by mouth 2 (two) times daily.     ferrous sulfate 325 (65 FE) MG tablet  Take 1 tablet (325 mg total) by mouth 3 (three) times daily after meals.     glimepiride 4 MG tablet  Commonly known as:  AMARYL  Take 2 mg by mouth daily.     hydrochlorothiazide 25 MG tablet  Commonly known as:  HYDRODIURIL  Take 25 mg by mouth every morning.     levothyroxine 150 MCG tablet  Commonly known as:  SYNTHROID,  LEVOTHROID  Take 150 mcg by mouth daily before breakfast.     methocarbamol 500 MG tablet  Commonly known as:  ROBAXIN  Take 1 tablet (500 mg total) by mouth every 6 (six) hours as needed for muscle spasms.     metoprolol 50 MG tablet  Commonly known as:  LOPRESSOR  Take 50 mg by mouth 2 (two) times daily.     oxyCODONE 5 MG immediate release tablet  Commonly known as:  Oxy  IR/ROXICODONE  Take 1-3 tablets (5-15 mg total) by mouth every 4 (four) hours as needed for severe pain.     polyethylene glycol packet  Commonly known as:  MIRALAX / GLYCOLAX  Take 17 g by mouth daily as needed for mild constipation.     rosuvastatin 10 MG tablet  Commonly known as:  CRESTOR  Take 10 mg by mouth every evening.     sertraline 50 MG tablet  Commonly known as:  ZOLOFT  Take 150 mg by mouth at bedtime.     sitaGLIPtin-metformin 50-1000 MG per tablet  Commonly known as:  JANUMET  Take 1 tablet by mouth 2 (two) times daily with a meal.         Signed: West Pugh. Quentez Lober   PAC  07/03/2013, 8:57 AM

## 2013-07-03 NOTE — Evaluation (Signed)
Occupational Therapy Evaluation Patient Details Name: [name] MRN: [id number] DOB: Apr 21, 1950 Today's Date: 07/03/2013    History of Present Illness L TKR   Clinical Impression   Pt at min assist level with functional toilet transfers today. Began education on AE for LB self care. Will benefit from skilled OT services to maximize safety and independence with self care tasks for next venue.    Follow Up Recommendations  SNF;Supervision/Assistance - 24 hour    Equipment Recommendations  3 in 1 bedside comode    Recommendations for Other Services       Precautions / Restrictions Precautions Precautions: Knee Restrictions Weight Bearing Restrictions: No Other Position/Activity Restrictions: WBAT      Mobility Bed Mobility Overal bed mobility: Needs Assistance Bed Mobility: Supine to Sit     Supine to sit: Min assist     General bed mobility comments: for L LE over the EOB  Transfers Overall transfer level: Needs assistance Equipment used: Rolling walker (2 wheeled) Transfers: Sit to/from Stand Sit to Stand: Min assist         General transfer comment: verbal cues for hand placement and L LE management.    Balance                                            ADL Overall ADL's : Needs assistance/impaired Eating/Feeding: Independent;Sitting   Grooming: Wash/dry hands;Set up;Sitting   Upper Body Bathing: Set up;Sitting   Lower Body Bathing: Sit to/from stand;Minimal assistance   Upper Body Dressing : Set up;Sitting   Lower Body Dressing: Moderate assistance;Sit to/from stand   Toilet Transfer: Minimal assistance;Ambulation;BSC;RW Toilet Transfer Details (indicate cue type and reason): across the room Toileting- Clothing Manipulation and Hygiene: Minimal assistance;Sit to/from stand         General ADL Comments: Discussed AE options for LB dressing as pt does own all the AE and OT demonstrated all AE for pt. Pt planning SNF.  She needs intermittant verbal cues for hand placement and L LE management.     Vision                     Perception     Praxis      Pertinent Vitals/Pain 1/10 L knee; reposition, ice     Hand Dominance Right   Extremity/Trunk Assessment Upper Extremity Assessment Upper Extremity Assessment: Overall WFL for tasks assessed           Communication Communication Communication: No difficulties   Cognition Arousal/Alertness: Awake/alert Behavior During Therapy: WFL for tasks assessed/performed Overall Cognitive Status: Within Functional Limits for tasks assessed                     General Comments       Exercises       Shoulder Instructions      Home Living Family/patient expects to be discharged to:: Skilled nursing facility Living Arrangements: Spouse/significant other                                      Prior Functioning/Environment Level of Independence: Independent             OT Diagnosis: Generalized weakness   OT Problem List: Decreased strength;Decreased knowledge of use of DME or AE  OT Treatment/Interventions: Self-care/ADL training;Patient/family education;Therapeutic activities;DME and/or AE instruction    OT Goals(Current goals can be found in the care plan section) Acute Rehab OT Goals Patient Stated Goal: Rehab and home to resume previous lifestyle with decreased pain OT Goal Formulation: With patient Time For Goal Achievement: 07/10/13 Potential to Achieve Goals: Good  OT Frequency: Min 2X/week   Barriers to D/C:            Co-evaluation              End of Session Equipment Utilized During Treatment: Gait belt;Rolling walker  Activity Tolerance: Patient tolerated treatment well Patient left: in chair;with call bell/phone within reach;with family/visitor present   Time: 0901-0925 OT Time Calculation (min): 24 min Charges:  OT General Charges $OT Visit: 1 Procedure OT  Evaluation $Initial OT Evaluation Tier I: 1 Procedure OT Treatments $Therapeutic Activity: 8-22 mins G-Codes:    Jules Schick [phone] 07/03/2013, 12:49 PM

## 2013-07-03 NOTE — Progress Notes (Signed)
   Subjective: 2 Days Post-Op Procedure(s) (LRB): LEFT TOTAL KNEE ARTHROPLASTY (Left)   Patient reports pain as mild, pain controlled. No events throughout the night.   Objective:   VITALS:   Filed Vitals:   07/03/13 0500  BP: 146/77  Pulse: 55  Temp: 98.7 F (37.1 C)  Resp: 16    Neurovascular intact Dorsiflexion/Plantar flexion intact Incision: dressing C/D/I No cellulitis present Compartment soft  LABS  Recent Labs  07/02/13 0525 07/03/13 0435  HGB 10.8* 11.0*  HCT 34.3* 33.9*  WBC 7.8 9.2  PLT 200 209     Recent Labs  07/02/13 0525 07/03/13 0435  NA 134* 137  K 4.8 4.8  BUN 26* 23  CREATININE 1.16* 1.19*  GLUCOSE 144* 192*     Assessment/Plan: 2 Days Post-Op Procedure(s) (LRB): LEFT TOTAL KNEE ARTHROPLASTY (Left) Up with therapy Discharge to SNF eventually, when ready  Expected ABLA  Treated with iron and will observe   Morbid Obesity (BMI >40)  Estimated body mass index is 45.64 kg/(m^2) as calculated from the following:      Height as of this encounter: 5\' 4"  (1.626 m).      Weight as of this encounter: 120.657 kg (266 lb).  Patient also counseled that weight may inhibit the healing process  Patient counseled that losing weight will help with future health issues       West Pugh. Gwenna Fuston   PAC  07/03/2013, 8:59 AM

## 2013-07-04 LAB — CBC
HCT: 33.4 % — ABNORMAL LOW (ref 36.0–46.0)
Hemoglobin: 10.8 g/dL — ABNORMAL LOW (ref 12.0–15.0)
MCH: 28 pg (ref 26.0–34.0)
MCHC: 32.3 g/dL (ref 30.0–36.0)
MCV: 86.5 fL (ref 78.0–100.0)
PLATELETS: 245 10*3/uL (ref 150–400)
RBC: 3.86 MIL/uL — AB (ref 3.87–5.11)
RDW: 15.8 % — ABNORMAL HIGH (ref 11.5–15.5)
WBC: 10.7 10*3/uL — ABNORMAL HIGH (ref 4.0–10.5)

## 2013-07-04 LAB — BASIC METABOLIC PANEL
BUN: 22 mg/dL (ref 6–23)
CHLORIDE: 99 meq/L (ref 96–112)
CO2: 24 mEq/L (ref 19–32)
Calcium: 9.1 mg/dL (ref 8.4–10.5)
Creatinine, Ser: 1.2 mg/dL — ABNORMAL HIGH (ref 0.50–1.10)
GFR calc Af Amer: 54 mL/min — ABNORMAL LOW (ref >90)
GFR calc non Af Amer: 47 mL/min — ABNORMAL LOW (ref >90)
Glucose, Bld: 215 mg/dL — ABNORMAL HIGH (ref 70–99)
Potassium: 5.4 mEq/L — ABNORMAL HIGH (ref 3.7–5.3)
SODIUM: 135 meq/L — AB (ref 137–147)

## 2013-07-04 LAB — GLUCOSE, CAPILLARY: Glucose-Capillary: 150 mg/dL — ABNORMAL HIGH (ref 70–99)

## 2013-07-04 NOTE — Progress Notes (Signed)
CSW assisting with d/c planning. [facility] is working with [employer] for authorization for [facility] placement. Pt is ready for d/c today. [facility] will provide SNF with a LOG , if [employer] has not made a determination regarding authorization by this afternoon, in order to facilitate d/c to SNF. Pt / [facility] are in agreement with this d/c plan.  Werner Lean LCSW [phone]

## 2013-07-04 NOTE — Progress Notes (Signed)
Physical Therapy Treatment Patient Details Name: [name] MRN: [id number] DOB: [birth year] Today's Date: Jul 27, 2013    History of Present Illness L TKR    PT Comments    POD # 3 pt c/o increased knee pain greater than yesterday.  Just got back to bed from BR so performed TKR TE's followed by ICE.    Follow Up Recommendations  SNF     Equipment Recommendations       Recommendations for Other Services       Precautions / Restrictions Precautions Precautions: Knee Restrictions Weight Bearing Restrictions: No Other Position/Activity Restrictions: WBAT             Exercises   Total Knee Replacement TE's 10 reps B LE ankle pumps 10 reps towel squeezes 10 reps knee presses 10 reps heel slides  10 reps SAQ's 10 reps SLR's 10 reps ABD Followed by ICE     General Comments        Pertinent Vitals/Pain C/o 8/10 ICE applied Not yet time for any pain meds           PT Goals (current goals can now be found in the care plan section) Progress towards PT goals: Progressing toward goals    Frequency  7X/week    PT Plan      Co-evaluation             End of Session   Activity Tolerance: Patient limited by pain Patient left: in bed;with call bell/phone within reach     Time: 1045-1105 PT Time Calculation (min): 20 min  Charges:  $Therapeutic Exercise: 8-22 mins                    G Codes:      Donnel Saxon Heidi Lemay 07/27/13, 1:42 PM

## 2013-07-04 NOTE — Progress Notes (Signed)
   Subjective: 3 Days Post-Op Procedure(s) (LRB): LEFT TOTAL KNEE ARTHROPLASTY (Left)   Patient reports pain as mild, pain controlled.  Feels that the pain is better today than it has been. No events throughout the night. Denies any CP of SOB.  Ready to be discharged to skilled nursing facility.  Objective:   VITALS:   Filed Vitals:   07/04/13 0514  BP: 128/64  Pulse: 66  Temp: 98.6 F (37 C)  Resp: 18    Neurovascular intact Dorsiflexion/Plantar flexion intact Incision: dressing C/D/I No cellulitis present Compartment soft  LABS  Recent Labs  07/02/13 0525 07/03/13 0435 07/04/13 0435  HGB 10.8* 11.0* 10.8*  HCT 34.3* 33.9* 33.4*  WBC 7.8 9.2 10.7*  PLT 200 209 245     Recent Labs  07/02/13 0525 07/03/13 0435 07/04/13 0435  NA 134* 137 135*  K 4.8 4.8 5.4*  BUN 26* 23 22  CREATININE 1.16* 1.19* 1.20*  GLUCOSE 144* 192* 215*     Assessment/Plan: 3 Days Post-Op Procedure(s) (LRB): LEFT TOTAL KNEE ARTHROPLASTY (Left) Up with therapy Discharge to SNF Follow up in 2 weeks at [facility]. Follow up with OLIN,Rimas Gilham D in 2 weeks.  Contact information:  [facility] [address] [place] [phone]    Expected ABLA  Treated with iron and will observe   Morbid Obesity (BMI >40)  Estimated body mass index is 45.64 kg/(m^2) as calculated from the following:      Height as of this encounter: 5\' 4"  (1.626 m).      Weight as of this encounter: 120.657 kg (266 lb).  Patient also counseled that weight may inhibit the healing process  Patient counseled that losing weight will help with future health issues       [place] P[place]. Demone Lyles   PAC  07/04/2013, 8:11 AM

## 2013-07-05 NOTE — Progress Notes (Signed)
Clinical Social Work Department CLINICAL SOCIAL WORK PLACEMENT NOTE 07/05/2013  Patient:  [name], [name]  Account Number:  [id number] Admit date:  07/01/2013  Clinical Social Worker:  Sindy Messing, LCSW  Date/time:  07/02/2013 01:00 PM  Clinical Social Work is seeking post-discharge placement for this patient at the following level of care:   [place]   (*CSW will update this form in Epic as items are completed)   07/02/2013  Patient/family provided with [facility] Department of Clinical Social Work's list of facilities offering this level of care within the geographic area requested by the patient (or if unable, by the patient's family).  07/02/2013  Patient/family informed of their freedom to choose among providers that offer the needed level of care, that participate in Medicare, Medicaid or managed care program needed by the patient, have an available bed and are willing to accept the patient.  07/02/2013  Patient/family informed of [facility]' ownership interest in [facility], as well as of the fact that they are under no obligation to receive care at this facility.  PASARR submitted to EDS on 07/02/2013 PASARR number received from EDS on 07/02/2013  FL2 transmitted to all facilities in geographic area requested by pt/family on  07/02/2013 FL2 transmitted to all facilities within larger geographic area on   Patient informed that his/her managed care company has contracts with or will negotiate with  certain facilities, including the following:     Patient/family informed of bed offers received:  07/03/2013 Patient chooses bed at [facility] Physician recommends and patient chooses bed at    Patient to be transferred to [facility] on  07/04/2013 Patient to be transferred to facility by [facility]  The following physician request were entered in Epic:   Additional Comments:  [employer] provided authorization for SNF  placement.  Werner Lean LCSW [phone]

## 2013-07-07 ENCOUNTER — Ambulatory Visit: Payer: BC Managed Care – PPO | Admitting: Internal Medicine

## 2013-07-07 LAB — GLUCOSE, CAPILLARY: Glucose-Capillary: 128 mg/dL — ABNORMAL HIGH (ref 70–99)

## 2013-07-28 ENCOUNTER — Other Ambulatory Visit: Payer: Self-pay | Admitting: Internal Medicine

## 2013-07-31 ENCOUNTER — Encounter: Payer: Self-pay | Admitting: Family Medicine

## 2013-07-31 ENCOUNTER — Ambulatory Visit (INDEPENDENT_AMBULATORY_CARE_PROVIDER_SITE_OTHER): Payer: BC Managed Care – PPO | Admitting: Family Medicine

## 2013-07-31 VITALS — BP 126/82 | HR 61 | Temp 98.4°F | Resp 16 | Wt 259.0 lb

## 2013-07-31 DIAGNOSIS — J019 Acute sinusitis, unspecified: Secondary | ICD-10-CM

## 2013-07-31 MED ORDER — PROMETHAZINE-DM 6.25-15 MG/5ML PO SYRP: 5.0000 mL | ORAL_SOLUTION | Freq: Four times a day (QID) | ORAL | Status: DC | PRN

## 2013-07-31 MED ORDER — AMOXICILLIN 875 MG PO TABS: 875.0000 mg | ORAL_TABLET | Freq: Two times a day (BID) | ORAL | Status: DC

## 2013-07-31 NOTE — Assessment & Plan Note (Addendum)
Pt's sxs and PE consistent w/ frontal sinusitis.  Start abx for suspected bacterial infxn- particularly in setting of recent TKR.  Start cough syrup due to hacking cough and known bronchitis exposure.  Reviewed supportive care and red flags that should prompt return.  Pt expressed understanding and is in agreement w/ plan.

## 2013-07-31 NOTE — Progress Notes (Signed)
   Subjective:    Patient ID: [name], female    DOB: Nov 20, 1950, 63 y.o.   MRN: [id number]  Cough Associated symptoms include headaches.  Sinusitis Associated symptoms include coughing and headaches.  Headache  Associated symptoms include coughing.   URI- pt's father has recently had bronchitis and pt developed sxs 3 days ago.  + cough.  No fever.  + frontal pressure, nasal congestion, PND.  No N/V.  No ear pain.  + sore throat.  S/p L TKR 1 month ago.  Hx of DM.   Review of Systems  Respiratory: Positive for cough.   Neurological: Positive for headaches.   For ROS see HPI     Objective:   Physical Exam  Vitals reviewed. Constitutional: She appears well-developed and well-nourished. No distress.  HENT:  Head: Normocephalic and atraumatic.  Right Ear: Tympanic membrane normal.  Left Ear: Tympanic membrane normal.  Nose: Mucosal edema and rhinorrhea present. Right sinus exhibits frontal sinus tenderness. Right sinus exhibits no maxillary sinus tenderness. Left sinus exhibits frontal sinus tenderness. Left sinus exhibits no maxillary sinus tenderness.  Mouth/Throat: Uvula is midline and mucous membranes are normal. Posterior oropharyngeal erythema present. No oropharyngeal exudate.  Eyes: Conjunctivae and EOM are normal. Pupils are equal, round, and reactive to light.  Neck: Normal range of motion. Neck supple.  Cardiovascular: Normal rate, regular rhythm and normal heart sounds.   Pulmonary/Chest: Effort normal and breath sounds normal. No respiratory distress. She has no wheezes.  + hacking cough  Lymphadenopathy:    She has no cervical adenopathy.          Assessment & Plan:

## 2013-07-31 NOTE — Progress Notes (Signed)
Pre visit review using our clinic review tool, if applicable. No additional management support is needed unless otherwise documented below in the visit note. 

## 2013-07-31 NOTE — Patient Instructions (Signed)
Follow up as needed Start the Amoxicillin twice daily- take w/ food- for the sinus infection Drink plenty of fluids Mucinex DM for daytime cough Cough syrup as needed- will cause drowsiness REST! Call with any questions or concerns Hang in there!!!

## 2013-08-11 ENCOUNTER — Other Ambulatory Visit: Payer: Self-pay | Admitting: Internal Medicine

## 2013-09-21 ENCOUNTER — Other Ambulatory Visit: Payer: Self-pay | Admitting: Internal Medicine

## 2013-09-22 NOTE — Telephone Encounter (Signed)
Medications refilled for patient as Dr. Larose Kells is out of office.  She is due for follow-up since her Amaryl was decreased.  Further refills to come from Dr. Larose Kells

## 2013-10-22 ENCOUNTER — Other Ambulatory Visit: Payer: Self-pay | Admitting: Internal Medicine

## 2013-10-25 ENCOUNTER — Encounter: Payer: Self-pay | Admitting: Internal Medicine

## 2013-10-27 ENCOUNTER — Other Ambulatory Visit: Payer: Self-pay | Admitting: Internal Medicine

## 2013-10-31 ENCOUNTER — Telehealth: Payer: Self-pay | Admitting: Internal Medicine

## 2013-10-31 MED ORDER — GLUCOSE BLOOD VI STRP
ORAL_STRIP | Status: AC
Start: 2013-10-31 — End: ?

## 2013-10-31 NOTE — Telephone Encounter (Signed)
Strips sent to Pharmacy.

## 2013-10-31 NOTE — Telephone Encounter (Signed)
Caller name: Chelsea  Relation to pt: other / [place]  Call back number:[phone]  Reason for call: [place] requesting test strips.

## 2013-11-12 NOTE — Telephone Encounter (Signed)
Tried calling Dianna at [facility]. No one answered phone. No answer machine or service set up.

## 2013-11-12 NOTE — Telephone Encounter (Signed)
Dianna calling from [facility] actual testing frequency of test strips. Best # [phone]

## 2013-11-12 NOTE — Telephone Encounter (Signed)
Faxed information to [facility] for Pt to check BS once daily.

## 2013-11-20 ENCOUNTER — Other Ambulatory Visit: Payer: Self-pay

## 2013-11-20 DIAGNOSIS — Z1231 Encounter for screening mammogram for malignant neoplasm of breast: Secondary | ICD-10-CM

## 2013-11-21 ENCOUNTER — Ambulatory Visit: Payer: BC Managed Care – PPO | Admitting: Pulmonary Disease

## 2013-11-26 ENCOUNTER — Ambulatory Visit (INDEPENDENT_AMBULATORY_CARE_PROVIDER_SITE_OTHER): Payer: BC Managed Care – PPO | Admitting: Pulmonary Disease

## 2013-11-26 ENCOUNTER — Encounter (INDEPENDENT_AMBULATORY_CARE_PROVIDER_SITE_OTHER): Payer: Self-pay

## 2013-11-26 ENCOUNTER — Encounter: Payer: Self-pay | Admitting: Pulmonary Disease

## 2013-11-26 VITALS — BP 124/78 | HR 55 | Temp 98.4°F | Ht 65.0 in | Wt 250.8 lb

## 2013-11-26 DIAGNOSIS — G4733 Obstructive sleep apnea (adult) (pediatric): Secondary | ICD-10-CM

## 2013-11-26 NOTE — Patient Instructions (Signed)
Continue on cpap, and we will send an order to your homecare company for new supplies and mask. Keep working on weight loss, you are doing great. followup with me again in one year.

## 2013-11-26 NOTE — Progress Notes (Signed)
   Subjective:    Patient ID: [name], female    DOB: 08/15/1950, 63 y.o.   MRN: [id number]  HPI Patient comes in today for followup of her obstructive sleep apnea. She is wearing CPAP compliantly, and is having no issues with her mask fit or pressure. She is due for her new mask and supplies. She feels that she sleeps very well, and is satisfied with her daytime alertness. She has continued to lose weight, and is down another 7 pounds from her last visit.   Review of Systems  Constitutional: Negative for fever and unexpected weight change.  HENT: Negative for congestion, dental problem, ear pain, nosebleeds, postnasal drip, rhinorrhea, sinus pressure, sneezing, sore throat and trouble swallowing.   Eyes: Negative for redness and itching.  Respiratory: Negative for cough, chest tightness, shortness of breath and wheezing.   Cardiovascular: Negative for palpitations and leg swelling.  Gastrointestinal: Negative for nausea and vomiting.  Genitourinary: Negative for dysuria.  Musculoskeletal: Negative for joint swelling.  Skin: Negative for rash.  Neurological: Negative for headaches.  Hematological: Does not bruise/bleed easily.  Psychiatric/Behavioral: Negative for dysphoric mood. The patient is not nervous/anxious.        Objective:   Physical Exam Overweight female in no acute distress Nose without purulence or discharge noted Neck without lymphadenopathy or thyromegaly No skin breakdown or pressure necrosis from the CPAP mask Lower extremities without significant edema, no cyanosis Alert and oriented, moves all 4 extremities.       Assessment & Plan:

## 2013-11-26 NOTE — Assessment & Plan Note (Signed)
Patient continues to do well from a sleep apnea standpoint. She is staying compliant with CPAP, and has lost another 7 pounds. I have congratulated her on this, and asked her to continue. We will send an order to her home care company for a new mask and supplies.

## 2013-12-02 ENCOUNTER — Ambulatory Visit
Admission: RE | Admit: 2013-12-02 | Discharge: 2013-12-02 | Disposition: A | Discharge status: Home / Self Care | Payer: BC Managed Care – PPO | Source: Ambulatory Visit

## 2013-12-02 DIAGNOSIS — Z1231 Encounter for screening mammogram for malignant neoplasm of breast: Secondary | ICD-10-CM

## 2013-12-04 ENCOUNTER — Other Ambulatory Visit: Payer: Self-pay | Admitting: Internal Medicine

## 2013-12-04 DIAGNOSIS — N63 Unspecified lump in unspecified breast: Secondary | ICD-10-CM

## 2013-12-15 ENCOUNTER — Ambulatory Visit
Admission: RE | Admit: 2013-12-15 | Discharge: 2013-12-15 | Disposition: A | Discharge status: Home / Self Care | Payer: BC Managed Care – PPO | Source: Ambulatory Visit | Attending: Internal Medicine | Admitting: Internal Medicine

## 2013-12-15 ENCOUNTER — Other Ambulatory Visit: Payer: Self-pay | Admitting: Internal Medicine

## 2013-12-15 DIAGNOSIS — N63 Unspecified lump in unspecified breast: Secondary | ICD-10-CM

## 2013-12-29 ENCOUNTER — Other Ambulatory Visit: Payer: Self-pay | Admitting: Physician Assistant

## 2014-01-24 ENCOUNTER — Other Ambulatory Visit: Payer: Self-pay | Admitting: Internal Medicine

## 2014-01-28 ENCOUNTER — Other Ambulatory Visit: Payer: Self-pay

## 2014-02-03 ENCOUNTER — Ambulatory Visit: Payer: BC Managed Care – PPO | Admitting: Internal Medicine

## 2014-02-04 ENCOUNTER — Ambulatory Visit (INDEPENDENT_AMBULATORY_CARE_PROVIDER_SITE_OTHER): Payer: BC Managed Care – PPO | Admitting: Internal Medicine

## 2014-02-04 ENCOUNTER — Encounter: Payer: Self-pay | Admitting: Internal Medicine

## 2014-02-04 VITALS — BP 128/72 | HR 55 | Temp 97.6°F | Wt 250.0 lb

## 2014-02-04 DIAGNOSIS — I10 Essential (primary) hypertension: Secondary | ICD-10-CM

## 2014-02-04 DIAGNOSIS — M1990 Unspecified osteoarthritis, unspecified site: Secondary | ICD-10-CM

## 2014-02-04 DIAGNOSIS — E038 Other specified hypothyroidism: Secondary | ICD-10-CM

## 2014-02-04 DIAGNOSIS — E119 Type 2 diabetes mellitus without complications: Secondary | ICD-10-CM

## 2014-02-04 DIAGNOSIS — E034 Atrophy of thyroid (acquired): Secondary | ICD-10-CM

## 2014-02-04 MED ORDER — ALPRAZOLAM 0.5 MG PO TABS: 0.5000 mg | ORAL_TABLET | Freq: Three times a day (TID) | ORAL | Status: DC | PRN

## 2014-02-04 NOTE — Patient Instructions (Signed)
Get your blood work before you leave  We also need a urine sample     Please come back to the office in 3-4 months  for a physical exam. Come back fasting

## 2014-02-04 NOTE — Progress Notes (Signed)
Pre visit review using our clinic review tool, if applicable. No additional management support is needed unless otherwise documented below in the visit note. 

## 2014-02-04 NOTE — Progress Notes (Signed)
Subjective:    Patient ID: [name], female    DOB: 03/17/1950, 63 y.o.   MRN: [id number]  DOS:  02/04/2014 Type of visit - description : rov Interval history: Few weeks ago burned her left arm, did not seek medical attention, healing well, denies fever, chills, discharge. DJD, doing well after left knee replacement Anxiety, on Xanax, symptoms well-controlled Diabetes, good medication compliance, ambulatory blood sugars range from 110 in the morning to 90 in the afternoon. Occasionally has low blood sugar symptoms that go away with candy. No loss of consciousness Hypertension, good compliance with medications, ambulatory BPs usually within normal  ROS Denies chest pain, difficulty breathing No nausea, vomiting, diarrhea. Occasional migraine headaches, no dizziness.   Past Medical History  Diagnosis Date  . Goiter     h/o  . Hypothyroidism     w/ h/o goiter  . Morbid obesity   . Dyslipidemia   . Hypertension   . Osteoarthritis   . Depression   . Diabetes mellitus     has diarrhea from medicine  . Bradycardia   . Complication of anesthesia     HR dropped to 20  . PONV (postoperative nausea and vomiting)   . Seasonal allergies   . Sleep apnea     on CPAP    Past Surgical History  Procedure Laterality Date  . Total knee arthroplasty  08/05/07    right  . Colonoscopy    . Appendectomy  1970's  . Knee arthroscopy w/ meniscectomy Right 2007  . Total knee arthroplasty Left 07/01/2013    Procedure: LEFT TOTAL KNEE ARTHROPLASTY;  Surgeon: Mauri Pole, MD;  Location: [facility];  Service: Orthopedics;  Laterality: Left;    History   Social History  . Marital Status: Married    Spouse Name: N/A    Number of Children: 1  . Years of Education: N/A   Occupational History  . logistics    Social History Main Topics  . Smoking status: Former Smoker -- 0.50 packs/day for 10 years    Types: Cigarettes    Quit date: 02/28/1980  . Smokeless tobacco: Never Used  .  Alcohol Use: Yes     Comment: occasionally  . Drug Use: No  . Sexual Activity: Not on file   Other Topics Concern  . Not on file   Social History Narrative   1 child: lost child 01/2010     Husband disable         Medication List       This list is accurate as of: 02/04/14  3:17 PM.  Always use your most recent med list.               ALPRAZolam 0.5 MG tablet  Commonly known as:  XANAX  Take 1 tablet (0.5 mg total) by mouth 3 (three) times daily as needed for anxiety.     amoxicillin 875 MG tablet  Commonly known as:  AMOXIL  Take 875 mg by mouth 2 (two) times daily. Prior to dental procedures     benazepril 40 MG tablet  Commonly known as:  LOTENSIN  Take 1 tablet (40 mg total) by mouth daily. DUE FOR APPT WITH DR Nahome Bublitz FOR ANY FURTHER REFILLS. [phone].     CALCIUM + D PO  Take 1 tablet by mouth daily.     cetirizine 10 MG tablet  Commonly known as:  ZYRTEC  Take 10 mg by mouth daily.     CRESTOR 10  MG tablet  Generic drug:  rosuvastatin  TAKE 1 TABLET EVERY DAY     DSS 100 MG Caps  Take 100 mg by mouth 2 (two) times daily.     ferrous sulfate 325 (65 FE) MG tablet  Take 1 tablet (325 mg total) by mouth 3 (three) times daily after meals.     glimepiride 4 MG tablet  Commonly known as:  AMARYL  Take 0.5 tablets (2 mg total) by mouth daily with breakfast.     glucose blood test strip  Commonly known as:  ONE TOUCH ULTRA TEST  Use as instructed     hydrochlorothiazide 25 MG tablet  Commonly known as:  HYDRODIURIL  Take 1 tablet daily. NEEDS OV FOR ANY FURTHER REFILLS. [phone].     metoprolol 50 MG tablet  Commonly known as:  LOPRESSOR  Take 1 tablet twice daily. NEEDS OV WITH DR Herb Beltre FOR ANY FURTHER REFILLS. [phone].     polyethylene glycol packet  Commonly known as:  MIRALAX / GLYCOLAX  Take 17 g by mouth daily as needed for mild constipation.     sertraline 50 MG tablet  Commonly known as:  ZOLOFT  TAKE 3 TABLETS EVERY DAY      sitaGLIPtin-metformin 50-1000 MG per tablet  Commonly known as:  JANUMET  Take 1 tablet twice daily. DUE FOR APPT WITH DR Eleftheria Taborn. [phone].     SYNTHROID 150 MCG tablet  Generic drug:  levothyroxine  TAKE 1 TABLET EVERY DAY           Objective:   Physical Exam  Skin:      BP 128/72 mmHg  Pulse 55  Temp(Src) 97.6 F (36.4 C) (Oral)  Wt 250 lb (113.399 kg)  SpO2 98% General -- alert, well-developed, NAD.  Lungs -- normal respiratory effort, no intercostal retractions, no accessory muscle use, and normal breath sounds.  Heart-- normal rate, regular rhythm, no murmur.  Extremities-- trace  pretibial edema bilaterally  Neurologic--  alert & oriented X3. Speech normal, gait appropriate for age, strength symmetric and appropriate for age.  Psych-- Cognition and judgment appear intact. Cooperative with normal attention span and concentration. No anxious or depressed appearing.      Assessment & Plan:   Hypertension Well-controlled, continue with Lotensin, Lopressor,HCTZ Check a BMP Anxiety,  refill Xanax, get a UDS Burn, left arm Seems to be healing well

## 2014-02-05 LAB — CBC WITH DIFFERENTIAL/PLATELET
Basophils Absolute: 0 10*3/uL (ref 0.0–0.1)
Basophils Relative: 0.1 % (ref 0.0–3.0)
EOS PCT: 3.6 % (ref 0.0–5.0)
Eosinophils Absolute: 0.3 10*3/uL (ref 0.0–0.7)
HCT: 37.9 % (ref 36.0–46.0)
HEMOGLOBIN: 12.4 g/dL (ref 12.0–15.0)
LYMPHS ABS: 2 10*3/uL (ref 0.7–4.0)
LYMPHS PCT: 26.2 % (ref 12.0–46.0)
MCHC: 32.8 g/dL (ref 30.0–36.0)
MCV: 84.9 fl (ref 78.0–100.0)
Monocytes Absolute: 0.5 10*3/uL (ref 0.1–1.0)
Monocytes Relative: 6.6 % (ref 3.0–12.0)
NEUTROS ABS: 4.9 10*3/uL (ref 1.4–7.7)
Neutrophils Relative %: 63.5 % (ref 43.0–77.0)
PLATELETS: 278 10*3/uL (ref 150.0–400.0)
RBC: 4.46 Mil/uL (ref 3.87–5.11)
RDW: 15.8 % — AB (ref 11.5–15.5)
WBC: 7.8 10*3/uL (ref 4.0–10.5)

## 2014-02-05 LAB — BASIC METABOLIC PANEL
BUN: 28 mg/dL — ABNORMAL HIGH (ref 6–23)
CALCIUM: 8.9 mg/dL (ref 8.4–10.5)
CO2: 21 mEq/L (ref 19–32)
Chloride: 109 mEq/L (ref 96–112)
Creatinine, Ser: 1.1 mg/dL (ref 0.4–1.2)
GFR: 54.18 mL/min — AB (ref >60.00)
GLUCOSE: 57 mg/dL — AB (ref 70–99)
Potassium: 4.3 mEq/L (ref 3.5–5.1)
Sodium: 139 mEq/L (ref 135–145)

## 2014-02-05 LAB — TSH: TSH: 2.85 u[IU]/mL (ref 0.35–4.50)

## 2014-02-05 LAB — HEMOGLOBIN A1C: Hgb A1c MFr Bld: 6.7 % — ABNORMAL HIGH (ref 4.6–6.5)

## 2014-02-05 NOTE — Assessment & Plan Note (Signed)
DJD, Doing great after knee replacement few  months ago, had postop anemia, on iron. Check CBC, consider discontinue iron

## 2014-02-05 NOTE — Assessment & Plan Note (Signed)
Diabetes Good medication compliance, occasionally has low blood sugar symptoms, we'll monitor her situation for now, if symptoms get worse she was let me know. Recommend to always carry sugar with her. Check A1c

## 2014-02-17 ENCOUNTER — Telehealth: Payer: Self-pay

## 2014-02-17 ENCOUNTER — Other Ambulatory Visit: Payer: Self-pay | Admitting: Internal Medicine

## 2014-02-17 ENCOUNTER — Other Ambulatory Visit: Payer: Self-pay

## 2014-02-17 MED ORDER — BENAZEPRIL HCL 40 MG PO TABS: 40.0000 mg | ORAL_TABLET | Freq: Every day | ORAL | Status: DC

## 2014-02-17 NOTE — Telephone Encounter (Signed)
UDS: 02/04/2014  Negative for Alprazolam: PRN  Low risk per Dr. Larose Kells 02/17/2014

## 2014-02-22 ENCOUNTER — Other Ambulatory Visit: Payer: Self-pay | Admitting: Internal Medicine

## 2014-02-24 ENCOUNTER — Encounter: Payer: Self-pay | Admitting: Internal Medicine

## 2014-02-24 ENCOUNTER — Other Ambulatory Visit: Payer: Self-pay | Admitting: Internal Medicine

## 2014-03-12 ENCOUNTER — Ambulatory Visit (HOSPITAL_BASED_OUTPATIENT_CLINIC_OR_DEPARTMENT_OTHER)
Admission: RE | Admit: 2014-03-12 | Discharge: 2014-03-12 | Disposition: A | Discharge status: Home / Self Care | Payer: BLUE CROSS/BLUE SHIELD | Source: Ambulatory Visit | Attending: Family Medicine | Admitting: Family Medicine

## 2014-03-12 ENCOUNTER — Encounter: Payer: Self-pay | Admitting: Family Medicine

## 2014-03-12 ENCOUNTER — Ambulatory Visit (INDEPENDENT_AMBULATORY_CARE_PROVIDER_SITE_OTHER): Payer: BLUE CROSS/BLUE SHIELD | Admitting: Family Medicine

## 2014-03-12 VITALS — BP 112/70 | HR 57 | Temp 97.6°F | Resp 18 | Wt 252.0 lb

## 2014-03-12 DIAGNOSIS — M545 Low back pain: Secondary | ICD-10-CM | POA: Diagnosis present

## 2014-03-12 DIAGNOSIS — M47896 Other spondylosis, lumbar region: Secondary | ICD-10-CM | POA: Diagnosis not present

## 2014-03-12 DIAGNOSIS — M25552 Pain in left hip: Secondary | ICD-10-CM

## 2014-03-12 DIAGNOSIS — I700 Atherosclerosis of aorta: Secondary | ICD-10-CM | POA: Insufficient documentation

## 2014-03-12 DIAGNOSIS — Y93K1 Activity, walking an animal: Secondary | ICD-10-CM | POA: Diagnosis not present

## 2014-03-12 DIAGNOSIS — W19XXXA Unspecified fall, initial encounter: Secondary | ICD-10-CM | POA: Insufficient documentation

## 2014-03-12 DIAGNOSIS — M25511 Pain in right shoulder: Secondary | ICD-10-CM | POA: Diagnosis not present

## 2014-03-12 MED ORDER — CYCLOBENZAPRINE HCL 10 MG PO TABS: 10.0000 mg | ORAL_TABLET | Freq: Three times a day (TID) | ORAL | Status: DC | PRN

## 2014-03-12 MED ORDER — HYDROCODONE-ACETAMINOPHEN 5-325 MG PO TABS: 1.0000 | ORAL_TABLET | Freq: Four times a day (QID) | ORAL | Status: DC | PRN

## 2014-03-12 NOTE — Progress Notes (Signed)
   Subjective:    Patient ID: [name], female    DOB: Sep 22, 1950, 64 y.o.   MRN: [id number]  HPI Pt here c/o R shoulder and L hp pain since Monday.  She fell when dog jerked her.  Pain has increasingly gotten worse.  Her father died that night as well.     Review of Systems  Constitutional: Positive for activity change. Negative for fever, chills, diaphoresis, appetite change, fatigue and unexpected weight change.  Cardiovascular: Negative for chest pain, palpitations and leg swelling.  Musculoskeletal: Positive for myalgias and gait problem. Negative for back pain, joint swelling, arthralgias, neck pain and neck stiffness.  Psychiatric/Behavioral: Negative.        Objective:   Physical Exam BP 112/70 mmHg  Pulse 57  Temp(Src) 97.6 F (36.4 C) (Oral)  Resp 18  Wt 252 lb (114.306 kg)  SpO2 96% General appearance: alert, cooperative, appears stated age and no distress Throat: lips, mucosa, and tongue normal; teeth and gums normal Neck: no adenopathy, no carotid bruit, no JVD, supple, symmetrical, trachea midline and thyroid not enlarged, symmetric, no tenderness/mass/nodules Lungs: clear to auscultation bilaterally Heart: S1, S2 normal Extremities: extremities normal, atraumatic, no cyanosis or edema Let hip --+ bruise , no pain with palp                 r shoulder --no bruise but dec ROM                Sig pain with abduction        Assessment & Plan:  1. Pain in joint, shoulder region, right  - DG Shoulder Right; Future - DG HIP UNILAT WITH PELVIS 2-3 VIEWS LEFT; Future - HYDROcodone-acetaminophen (NORCO/VICODIN) 5-325 MG per tablet; Take 1 tablet by mouth every 6 (six) hours as needed for moderate pain.  Dispense: 30 tablet; Refill: 0 - cyclobenzaprine (FLEXERIL) 10 MG tablet; Take 1 tablet (10 mg total) by mouth 3 (three) times daily as needed for muscle spasms.  Dispense: 30 tablet; Refill: 0 - Ambulatory referral to Orthopedic Surgery  2. Hip pain, acute,  left  - DG Lumbar Spine 2-3 Views; Future - HYDROcodone-acetaminophen (NORCO/VICODIN) 5-325 MG per tablet; Take 1 tablet by mouth every 6 (six) hours as needed for moderate pain.  Dispense: 30 tablet; Refill: 0 - cyclobenzaprine (FLEXERIL) 10 MG tablet; Take 1 tablet (10 mg total) by mouth 3 (three) times daily as needed for muscle spasms.  Dispense: 30 tablet; Refill: 0 - Ambulatory referral to Orthopedic Surgery

## 2014-03-12 NOTE — Patient Instructions (Signed)

## 2014-03-12 NOTE — Progress Notes (Signed)
Pre visit review using our clinic review tool, if applicable. No additional management support is needed unless otherwise documented below in the visit note. 

## 2014-03-13 ENCOUNTER — Telehealth: Payer: Self-pay | Admitting: Internal Medicine

## 2014-03-13 NOTE — Telephone Encounter (Signed)
Called patient and notified of lab results.   eal

## 2014-03-13 NOTE — Telephone Encounter (Signed)
Pt saw Dr. Etter Sjogren on 03/12/2014, she ordered x-ray. Please send to [facility].

## 2014-03-13 NOTE — Telephone Encounter (Signed)
Caller name: Murrel, Freet Relation to pt: self  Call back number: [phone]  Reason for call:  Pt returning your call regarding x ray results.

## 2014-04-01 ENCOUNTER — Other Ambulatory Visit: Payer: Self-pay | Admitting: Physician Assistant

## 2014-04-26 ENCOUNTER — Other Ambulatory Visit: Payer: Self-pay | Admitting: Internal Medicine

## 2014-07-15 ENCOUNTER — Telehealth: Payer: Self-pay | Admitting: Internal Medicine

## 2014-07-15 NOTE — Telephone Encounter (Signed)
Pre Visit letter sent  °

## 2014-07-23 ENCOUNTER — Telehealth: Payer: Self-pay | Admitting: Internal Medicine

## 2014-07-23 NOTE — Telephone Encounter (Signed)
Relation to pt: self  Call back number: [phone]   Reason for call:  Pt requesting SYNTHROID 150 MCG tablet insurance has changed and pharmacy has changed;  [address], [place], [place] [address]  [phone]  Bernadene Person (primary) ID # [id number]  Group # [id number] Pt is the primary   Medicare red/blue (second) ID #[id number]

## 2014-07-24 MED ORDER — SYNTHROID 150 MCG PO TABS: 150.0000 ug | ORAL_TABLET | Freq: Every day | ORAL | Status: DC

## 2014-07-24 NOTE — Telephone Encounter (Signed)
Synthroid sent to [facility].

## 2014-08-05 ENCOUNTER — Telehealth: Payer: Self-pay

## 2014-08-05 NOTE — Telephone Encounter (Signed)
LMOVM

## 2014-08-06 ENCOUNTER — Encounter: Payer: Self-pay | Admitting: Internal Medicine

## 2014-08-06 ENCOUNTER — Ambulatory Visit (INDEPENDENT_AMBULATORY_CARE_PROVIDER_SITE_OTHER): Payer: Medicare HMO | Admitting: Internal Medicine

## 2014-08-06 VITALS — BP 120/70 | HR 37 | Temp 97.9°F | Ht 65.0 in | Wt 264.0 lb

## 2014-08-06 DIAGNOSIS — E039 Hypothyroidism, unspecified: Secondary | ICD-10-CM

## 2014-08-06 DIAGNOSIS — E785 Hyperlipidemia, unspecified: Secondary | ICD-10-CM | POA: Diagnosis not present

## 2014-08-06 DIAGNOSIS — G2581 Restless legs syndrome: Secondary | ICD-10-CM | POA: Diagnosis not present

## 2014-08-06 DIAGNOSIS — Z79899 Other long term (current) drug therapy: Secondary | ICD-10-CM | POA: Diagnosis not present

## 2014-08-06 DIAGNOSIS — M25552 Pain in left hip: Secondary | ICD-10-CM

## 2014-08-06 DIAGNOSIS — Z23 Encounter for immunization: Secondary | ICD-10-CM | POA: Diagnosis not present

## 2014-08-06 DIAGNOSIS — M25511 Pain in right shoulder: Secondary | ICD-10-CM

## 2014-08-06 DIAGNOSIS — I10 Essential (primary) hypertension: Secondary | ICD-10-CM

## 2014-08-06 DIAGNOSIS — Z780 Asymptomatic menopausal state: Secondary | ICD-10-CM

## 2014-08-06 DIAGNOSIS — Z0000 Encounter for general adult medical examination without abnormal findings: Secondary | ICD-10-CM

## 2014-08-06 DIAGNOSIS — E119 Type 2 diabetes mellitus without complications: Secondary | ICD-10-CM | POA: Diagnosis not present

## 2014-08-06 DIAGNOSIS — R001 Bradycardia, unspecified: Secondary | ICD-10-CM | POA: Diagnosis not present

## 2014-08-06 LAB — TSH: TSH: 74.8 u[IU]/mL — ABNORMAL HIGH (ref 0.35–4.50)

## 2014-08-06 LAB — ALT: ALT: 14 U/L (ref 0–35)

## 2014-08-06 LAB — LIPID PANEL
CHOLESTEROL: 222 mg/dL — AB (ref 0–200)
HDL: 45.6 mg/dL (ref >39.00)
NONHDL: 176.4
Total CHOL/HDL Ratio: 5
Triglycerides: 242 mg/dL — ABNORMAL HIGH (ref 0.0–149.0)
VLDL: 48.4 mg/dL — ABNORMAL HIGH (ref 0.0–40.0)

## 2014-08-06 LAB — BASIC METABOLIC PANEL
BUN: 28 mg/dL — AB (ref 6–23)
CO2: 24 meq/L (ref 19–32)
CREATININE: 1.41 mg/dL — AB (ref 0.40–1.20)
Calcium: 9.8 mg/dL (ref 8.4–10.5)
Chloride: 106 mEq/L (ref 96–112)
GFR: 39.77 mL/min — AB (ref >60.00)
GLUCOSE: 145 mg/dL — AB (ref 70–99)
POTASSIUM: 4.5 meq/L (ref 3.5–5.1)
Sodium: 138 mEq/L (ref 135–145)

## 2014-08-06 LAB — HEMOGLOBIN A1C: Hgb A1c MFr Bld: 7.2 % — ABNORMAL HIGH (ref 4.6–6.5)

## 2014-08-06 LAB — AST: AST: 17 U/L (ref 0–37)

## 2014-08-06 LAB — LDL CHOLESTEROL, DIRECT: Direct LDL: 142 mg/dL

## 2014-08-06 LAB — VITAMIN B12: VITAMIN B 12: 375 pg/mL (ref 211–911)

## 2014-08-06 MED ORDER — BENAZEPRIL HCL 40 MG PO TABS: 40.0000 mg | ORAL_TABLET | Freq: Every day | ORAL | Status: DC

## 2014-08-06 MED ORDER — SITAGLIPTIN PHOS-METFORMIN HCL 50-1000 MG PO TABS: 1.0000 | ORAL_TABLET | Freq: Two times a day (BID) | ORAL | Status: DC

## 2014-08-06 MED ORDER — SERTRALINE HCL 50 MG PO TABS: 150.0000 mg | ORAL_TABLET | Freq: Every day | ORAL | Status: DC

## 2014-08-06 MED ORDER — GABAPENTIN 300 MG PO CAPS: 300.0000 mg | ORAL_CAPSULE | Freq: Every day | ORAL | Status: DC

## 2014-08-06 MED ORDER — ROSUVASTATIN CALCIUM 10 MG PO TABS: 10.0000 mg | ORAL_TABLET | Freq: Every day | ORAL | Status: DC

## 2014-08-06 NOTE — Assessment & Plan Note (Addendum)
Heart rate today 37, not on beta blockers or calcium channel blockers, per chart review she had pos anesthesia bradycardia. She is asymptomatic EKG sinus bradycardia heart rate 41. Plan: Holter , cardiology referral

## 2014-08-06 NOTE — Assessment & Plan Note (Signed)
Control, check a BMP

## 2014-08-06 NOTE — Assessment & Plan Note (Addendum)
Td 2012 Pneumonia shot-- 2011 prevnar 07-2014 Shingles shot 2013   Cscope 2004: melanosis coli, no polyp----> Cscope again 7- 2014 (Dr Carlean Purl), normal next 65 years Female care, due to see gyn later this year  10-15 mammogram --->  Due for R MMG, pt states will call   DEXA normal, 12/2008-- ordering one today on calcium and vitamin D Diet and exercise discussed

## 2014-08-06 NOTE — Progress Notes (Signed)
Pre visit review using our clinic review tool, if applicable. No additional management support is needed unless otherwise documented below in the visit note. 

## 2014-08-06 NOTE — Patient Instructions (Addendum)
Get your blood work before you leave   Try gabapentin 300 mg at bedtime, see if that help with the leg discomfort  Fall Prevention and Home Safety Falls cause injuries and can affect all age groups. It is possible to use preventive measures to significantly decrease the likelihood of falls. There are many simple measures which can make your home safer and prevent falls. OUTDOORS  Repair cracks and edges of walkways and driveways.  Remove high doorway thresholds.  Trim shrubbery on the main path into your home.  Have good outside lighting.  Clear walkways of tools, rocks, debris, and clutter.  Check that handrails are not broken and are securely fastened. Both sides of steps should have handrails.  Have leaves, snow, and ice cleared regularly.  Use sand or salt on walkways during winter months.  In the garage, clean up grease or oil spills. BATHROOM  Install night lights.  Install grab bars by the toilet and in the tub and shower.  Use non-skid mats or decals in the tub or shower.  Place a plastic non-slip stool in the shower to sit on, if needed.  Keep floors dry and clean up all water on the floor immediately.  Remove soap buildup in the tub or shower on a regular basis.  Secure bath mats with non-slip, double-sided rug tape.  Remove throw rugs and tripping hazards from the floors. BEDROOMS  Install night lights.  Make sure a bedside light is easy to reach.  Do not use oversized bedding.  Keep a telephone by your bedside.  Have a firm chair with side arms to use for getting dressed.  Remove throw rugs and tripping hazards from the floor. KITCHEN  Keep handles on pots and pans turned toward the center of the stove. Use back burners when possible.  Clean up spills quickly and allow time for drying.  Avoid walking on wet floors.  Avoid hot utensils and knives.  Position shelves so they are not too high or low.  Place commonly used objects within easy  reach.  If necessary, use a sturdy step stool with a grab bar when reaching.  Keep electrical cables out of the way.  Do not use floor polish or wax that makes floors slippery. If you must use wax, use non-skid floor wax.  Remove throw rugs and tripping hazards from the floor. STAIRWAYS  Never leave objects on stairs.  Place handrails on both sides of stairways and use them. Fix any loose handrails. Make sure handrails on both sides of the stairways are as long as the stairs.  Check carpeting to make sure it is firmly attached along stairs. Make repairs to worn or loose carpet promptly.  Avoid placing throw rugs at the top or bottom of stairways, or properly secure the rug with carpet tape to prevent slippage. Get rid of throw rugs, if possible.  Have an electrician put in a light switch at the top and bottom of the stairs. OTHER FALL PREVENTION TIPS  Wear low-heel or rubber-soled shoes that are supportive and fit well. Wear closed toe shoes.  When using a stepladder, make sure it is fully opened and both spreaders are firmly locked. Do not climb a closed stepladder.  Add color or contrast paint or tape to grab bars and handrails in your home. Place contrasting color strips on first and last steps.  Learn and use mobility aids as needed. Install an electrical emergency response system.  Turn on lights to avoid dark areas. Replace  light bulbs that burn out immediately. Get light switches that glow.  Arrange furniture to create clear pathways. Keep furniture in the same place.  Firmly attach carpet with non-skid or double-sided tape.  Eliminate uneven floor surfaces.  Select a carpet pattern that does not visually hide the edge of steps.  Be aware of all pets. OTHER HOME SAFETY TIPS  Set the water temperature for 120 F (48.8 C).  Keep emergency numbers on or near the telephone.  Keep smoke detectors on every level of the home and near sleeping areas. Document Released:  02/03/2002 Document Revised: 08/15/2011 Document Reviewed: 05/05/2011 [facility] Patient Information 2015 [place], [place]. This information is not intended to replace advice given to you by your health care provider. Make sure you discuss any questions you have with your health care provider.   Preventive Care for Adults Ages 53 and over  Blood pressure check.** / Every 1 to 2 years.  Lipid and cholesterol check.**/ Every 5 years beginning at age 48.  Lung cancer screening. / Every year if you are aged 18-80 years and have a 30-pack-year history of smoking and currently smoke or have quit within the past 15 years. Yearly screening is stopped once you have quit smoking for at least 15 years or develop a health problem that would prevent you from having lung cancer treatment.  Fecal occult blood test (FOBT) of stool. / Every year beginning at age 71 and continuing until age 66. You may not have to do this test if you get a colonoscopy every 10 years.  Flexible sigmoidoscopy** or colonoscopy.** / Every 5 years for a flexible sigmoidoscopy or every 10 years for a colonoscopy beginning at age 45 and continuing until age 75.  Hepatitis C blood test.** / For all people born from [birth year] through 1966 and any individual with known risks for hepatitis C.  Abdominal aortic aneurysm (AAA) screening.** / A one-time screening for ages 28 to 19 years who are current or former smokers.  Skin self-exam. / Monthly.  Influenza vaccine. / Every year.  Tetanus, diphtheria, and acellular pertussis (Tdap/Td) vaccine.** / 1 dose of Td every 10 years.  Varicella vaccine.** / Consult your health care provider.  Zoster vaccine.** / 1 dose for adults aged 16 years or older.  Pneumococcal 13-valent conjugate (PCV13) vaccine.** / Consult your health care provider.  Pneumococcal polysaccharide (PPSV23) vaccine.** / 1 dose for all adults aged 59 years and older.  Meningococcal vaccine.** / Consult your health care  provider.  Hepatitis A vaccine.** / Consult your health care provider.  Hepatitis B vaccine.** / Consult your health care provider.  Haemophilus influenzae type b (Hib) vaccine.** / Consult your health care provider. **Family history and personal history of risk and conditions may change your health care provider's recommendations. Document Released: 04/11/2001 Document Revised: 02/18/2013 Document Reviewed: 07/11/2010 [facility] Patient Information 2015 [place], [place]. This information is not intended to replace advice given to you by your health care provider. Make sure you discuss any questions you have with your health care provider.

## 2014-08-06 NOTE — Assessment & Plan Note (Signed)
Continue Janumet, glimepiride 2 mg, benazepril Check the A1c

## 2014-08-06 NOTE — Progress Notes (Signed)
Subjective:    Patient ID: [name], female    DOB: 1950-11-28, 64 y.o.   MRN: [id number]  DOS:  08/06/2014 Type of visit - description :   Here for Medicare AWV:  1. Risk factors based on Past M, S, F history: reviewed 2. Physical Activities: takes walks most days 3. Depression/mood: on meds, controlled  4. Hearing:  No problems noted or reported  5. ADL's: independent, drives  6. Fall Risk: no recent falls, prevention discussed , see AVS 7. home Safety: does feel safe at home  8. Height, weight, & visual acuity: see VS, sees eye doctor regulalrly 9. Counseling: provided 10. Labs ordered based on risk factors: if needed  11. Referral Coordination: if needed 12. Care Plan, see assessment and plan , written personalized plan provided , see AVS 13. Cognitive Assessment: motor skills and cognition appropriate for age 47. Care team updated,  Dr Alvan Dame, Dr Gwenette Greet, Gyn Dr Pamala Hurry 15. End-of-life care discussed   In addition, today we discussed the following:  Diabetes, good compliance of medication, ambulatory blood sugars in the 140s. Hypertension, compliance with meds, ambulatory BPs always within normal around 120. High cholesterol, on Crestor, no apparent side effects such as myalgias. Bradycardia, heart rate is in the 80s, patient is asymptomatic, she was found to be bradycardic before , I see a notation of post anesthesia bradycardia. She is not on beta blockers or calcium channel blockers   Review of Systems Constitutional: No fever. No chills. No unexplained wt changes. No unusual sweats  HEENT: No dental problems, no ear discharge, no facial swelling, no voice changes. No eye discharge, no eye  redness , no  intolerance to light   Respiratory: No wheezing , no  difficulty breathing. No cough , no mucus production  Cardiovascular: No CP, no leg swelling , no  Palpitations; no fainting,dizziness or orthostatic symptoms  GI: no nausea, no vomiting, no diarrhea , no   abdominal pain.  No blood in the stools. No dysphagia, no odynophagia    Endocrine: No polyphagia, no polyuria , no polydipsia  GU: No dysuria, gross hematuria, difficulty urinating. No urinary urgency, no frequency.  Musculoskeletal: No joint swellings or unusual aches or pains. Bilateral lower extremity discomfort at night, walking or moving her legs help. No back pain. No claudication. The patient has a hard time describing the symptoms.  Skin: No change in the color of the skin, palor , no  Rash  Allergic, immunologic: No environmental allergies , no  food allergies  Neurological: No dizziness no  syncope. No headaches. No diplopia, no slurred, no slurred speech, no motor deficits, no facial  Numbness  Hematological: No enlarged lymph nodes, no easy bruising , no unusual bleedings  Psychiatry: No suicidal ideas, no hallucinations, no beavior problems, no confusion.       Past Medical History  Diagnosis Date  . Goiter     h/o  . Hypothyroidism     w/ h/o goiter  . Morbid obesity   . Dyslipidemia   . Hypertension   . Osteoarthritis   . Depression   . Diabetes mellitus     has diarrhea from medicine  . Bradycardia   . Complication of anesthesia     HR dropped to 20  . PONV (postoperative nausea and vomiting)   . Seasonal allergies   . Sleep apnea     on CPAP    Past Surgical History  Procedure Laterality Date  . Total knee arthroplasty  08/05/07    right  . Colonoscopy    . Appendectomy  1970's  . Knee arthroscopy w/ meniscectomy Right 2007  . Total knee arthroplasty Left 07/01/2013    Procedure: LEFT TOTAL KNEE ARTHROPLASTY;  Surgeon: Mauri Pole, MD;  Location: [facility];  Service: Orthopedics;  Laterality: Left;    History   Social History  . Marital Status: Married    Spouse Name: N/A  . Number of Children: 1  . Years of Education: N/A   Occupational History  . retired 01-2014    Social History Main Topics  . Smoking status: Former Smoker -- 0.50  packs/day for 10 years    Types: Cigarettes    Quit date: 02/28/1980  . Smokeless tobacco: Never Used  . Alcohol Use: Yes     Comment: occasionally  . Drug Use: No  . Sexual Activity: Not on file   Other Topics Concern  . Not on file   Social History Narrative   1 child: lost child 01/2010     Husband disable      Family History  Problem Relation Age of Onset  . Coronary artery disease Father     F MI age 35, 36 at age 46  . Hypertension Father   . Diabetes Mother     M , brother amd sister  . Lung cancer Mother     around the lungs  . Stomach cancer Brother   . Esophageal cancer Brother   . Colon cancer Neg Hx   . Breast cancer Neg Hx   . Rectal cancer Neg Hx   . CAD Brother     died age 87, h/o DM-Tobacco-PVD  . Breast cancer Neg Hx        Medication List       This list is accurate as of: 08/06/14  3:06 PM.  Always use your most recent med list.               ALPRAZolam 0.5 MG tablet  Commonly known as:  XANAX  Take 1 tablet (0.5 mg total) by mouth 3 (three) times daily as needed for anxiety.     benazepril 40 MG tablet  Commonly known as:  LOTENSIN  Take 1 tablet (40 mg total) by mouth daily.     CALCIUM + D PO  Take 1 tablet by mouth daily.     cetirizine 10 MG tablet  Commonly known as:  ZYRTEC  Take 10 mg by mouth daily.     DSS 100 MG Caps  Take 100 mg by mouth 2 (two) times daily.     ferrous sulfate 325 (65 FE) MG tablet  Take 1 tablet (325 mg total) by mouth 3 (three) times daily after meals.     gabapentin 300 MG capsule  Commonly known as:  NEURONTIN  Take 1 capsule (300 mg total) by mouth at bedtime.     glimepiride 4 MG tablet  Commonly known as:  AMARYL  TAKE 1/2 TABLET EVERY DAY WITH BREAKFAST     glucose blood test strip  Commonly known as:  ONE TOUCH ULTRA TEST  Use as instructed     hydrochlorothiazide 25 MG tablet  Commonly known as:  HYDRODIURIL  Take 1 tablet daily.     metoprolol 50 MG tablet  Commonly known  as:  LOPRESSOR  Take 1 tablet twice daily.     polyethylene glycol packet  Commonly known as:  MIRALAX / GLYCOLAX  Take 17 g by  mouth daily as needed for mild constipation.     rosuvastatin 10 MG tablet  Commonly known as:  CRESTOR  Take 1 tablet (10 mg total) by mouth daily.     sertraline 50 MG tablet  Commonly known as:  ZOLOFT  Take 3 tablets (150 mg total) by mouth daily.     sitaGLIPtin-metformin 50-1000 MG per tablet  Commonly known as:  JANUMET  Take 1 tablet by mouth 2 (two) times daily with a meal.     SYNTHROID 150 MCG tablet  Generic drug:  levothyroxine  Take 1 tablet (150 mcg total) by mouth daily.           Objective:   Physical Exam BP 120/70 mmHg  Pulse 37  Temp(Src) 97.9 F (36.6 C) (Oral)  Ht 5\' 5"  (1.651 m)  Wt 264 lb (119.75 kg)  BMI 43.93 kg/m2  SpO2 99% General:   Well developed, well nourished . NAD.  HEENT:  Normocephalic . Face symmetric, atraumatic. No thyromegaly Lungs:  CTA B Normal respiratory effort, no intercostal retractions, no accessory muscle use. Heart: RRR,  no murmur.  no pretibial edema bilaterally  Abdomen:  Not distended, soft, non-tender. No rebound or rigidity. No mass,organomegaly Skin: Not pale. Not jaundice Lower extremities: No edema, + varicose veins, no phlebitis. Good pedal pulses. Neurologic:  alert & oriented X3.  Speech normal, gait appropriate for age and unassisted Psych--  Cognition and judgment appear intact.  Cooperative with normal attention span and concentration.  Behavior appropriate. No anxious or depressed appearing.       Assessment & Plan:    Hypothyroidism, check TSH  Hyperlipidemia, on Crestor, check labs.  RLS? Trial with gabapentin

## 2014-08-07 ENCOUNTER — Telehealth: Payer: Self-pay | Admitting: Internal Medicine

## 2014-08-07 NOTE — Telephone Encounter (Signed)
Labs reviewed, TSH is 73!. Creatinine slightly elevated, A1c cholesterol also elevated. I spoke with the patient, she has not been taking her thyroid medications "in at least 2 weeks". Also she takes Aleve every night for pain. Plan: [name] ---> Cancel the cardiology and Holter monitor referral, bradycardia is likely related to hypothyroidism Patient will take Synthroid every day and call back next week to set up an appointment 4 weeks from today.

## 2014-08-10 ENCOUNTER — Inpatient Hospital Stay (HOSPITAL_BASED_OUTPATIENT_CLINIC_OR_DEPARTMENT_OTHER): Admission: RE | Admit: 2014-08-10 | Payer: Medicare HMO | Source: Ambulatory Visit

## 2014-08-10 LAB — VITAMIN D 1,25 DIHYDROXY
Vitamin D 1, 25 (OH)2 Total: 17 pg/mL — ABNORMAL LOW (ref 18–72)
Vitamin D2 1, 25 (OH)2: <8 pg/mL
Vitamin D3 1, 25 (OH)2: 17 pg/mL

## 2014-08-10 NOTE — Telephone Encounter (Signed)
Cardiology and holter monitor appts canceled.

## 2014-08-11 ENCOUNTER — Ambulatory Visit (HOSPITAL_BASED_OUTPATIENT_CLINIC_OR_DEPARTMENT_OTHER)
Admission: RE | Admit: 2014-08-11 | Discharge: 2014-08-11 | Disposition: A | Discharge status: Home / Self Care | Payer: Medicare HMO | Source: Ambulatory Visit | Attending: Internal Medicine | Admitting: Internal Medicine

## 2014-08-11 DIAGNOSIS — Z780 Asymptomatic menopausal state: Secondary | ICD-10-CM | POA: Insufficient documentation

## 2014-08-14 ENCOUNTER — Other Ambulatory Visit: Payer: Self-pay | Admitting: Internal Medicine

## 2014-08-14 ENCOUNTER — Telehealth: Payer: Self-pay | Admitting: Internal Medicine

## 2014-08-14 MED ORDER — VITAMIN D (ERGOCALCIFEROL) 1.25 MG (50000 UNIT) PO CAPS: 50000.0000 [IU] | ORAL_CAPSULE | ORAL | Status: DC

## 2014-08-14 NOTE — Addendum Note (Signed)
Addended by: Wilfrid Lund on: 08/14/2014 04:18 PM   Modules accepted: Orders

## 2014-08-14 NOTE — Telephone Encounter (Signed)
Pt called stating she is returning call about labs. Please call her if needed at [phone]. She has scheduled f/u appt for 09/04/14 per notes.

## 2014-08-14 NOTE — Telephone Encounter (Signed)
Spoke with Pt, informed her of lab results. See lab results notes from 08/06/2014.

## 2014-09-04 ENCOUNTER — Ambulatory Visit: Payer: Medicare HMO | Admitting: Internal Medicine

## 2014-09-07 ENCOUNTER — Ambulatory Visit (INDEPENDENT_AMBULATORY_CARE_PROVIDER_SITE_OTHER): Payer: Medicare HMO | Admitting: Internal Medicine

## 2014-09-07 ENCOUNTER — Encounter: Payer: Self-pay | Admitting: Internal Medicine

## 2014-09-07 VITALS — BP 124/72 | HR 46 | Temp 97.9°F | Ht 65.0 in | Wt 262.4 lb

## 2014-09-07 DIAGNOSIS — E119 Type 2 diabetes mellitus without complications: Secondary | ICD-10-CM | POA: Diagnosis not present

## 2014-09-07 DIAGNOSIS — E038 Other specified hypothyroidism: Secondary | ICD-10-CM | POA: Diagnosis not present

## 2014-09-07 DIAGNOSIS — E034 Atrophy of thyroid (acquired): Secondary | ICD-10-CM | POA: Diagnosis not present

## 2014-09-07 DIAGNOSIS — R001 Bradycardia, unspecified: Secondary | ICD-10-CM | POA: Diagnosis not present

## 2014-09-07 DIAGNOSIS — I10 Essential (primary) hypertension: Secondary | ICD-10-CM | POA: Diagnosis not present

## 2014-09-07 LAB — TSH: TSH: 1.29 u[IU]/mL (ref 0.35–4.50)

## 2014-09-07 LAB — BASIC METABOLIC PANEL
BUN: 28 mg/dL — ABNORMAL HIGH (ref 6–23)
CALCIUM: 9.4 mg/dL (ref 8.4–10.5)
CO2: 24 mEq/L (ref 19–32)
Chloride: 105 mEq/L (ref 96–112)
Creatinine, Ser: 1.18 mg/dL (ref 0.40–1.20)
GFR: 48.83 mL/min — ABNORMAL LOW (ref >60.00)
Glucose, Bld: 255 mg/dL — ABNORMAL HIGH (ref 70–99)
Potassium: 4.9 mEq/L (ref 3.5–5.1)
SODIUM: 137 meq/L (ref 135–145)

## 2014-09-07 MED ORDER — GLIMEPIRIDE 4 MG PO TABS: 4.0000 mg | ORAL_TABLET | Freq: Every day | ORAL | Status: DC

## 2014-09-07 MED ORDER — METFORMIN HCL 1000 MG PO TABS
1000.0000 mg | ORAL_TABLET | Freq: Two times a day (BID) | ORAL | Status: DC
Start: 2014-09-07 — End: 2015-07-19

## 2014-09-07 MED ORDER — ALPRAZOLAM 0.5 MG PO TABS: 0.5000 mg | ORAL_TABLET | Freq: Three times a day (TID) | ORAL | Status: DC | PRN

## 2014-09-07 NOTE — Progress Notes (Signed)
Pre visit review using our clinic review tool, if applicable. No additional management support is needed unless otherwise documented below in the visit note. 

## 2014-09-07 NOTE — Progress Notes (Signed)
Subjective:    Patient ID: [name], female    DOB: Apr 25, 1950, 64 y.o.   MRN: [id number]  DOS:  09/07/2014 Type of visit - description : Follow-up from previous visit Interval history: Bradycardia- felt to be due to hypothyroidism Hypothyroidism-now taking Synthroid consistently, due for labs Diabetes-Janumet is now not covered by her insurance, alternatives? Hypertension-good compliance of medication, last creatinine slightly elevated, we need to recheck labs   Review of Systems Feeling well. Denies chest pain, difficulty breathing No nausea, vomiting, diarrhea. No edema. No presyncope feeling  Past Medical History  Diagnosis Date  . Goiter     h/o  . Hypothyroidism     w/ h/o goiter  . Morbid obesity   . Dyslipidemia   . Hypertension   . Osteoarthritis   . Depression   . Diabetes mellitus     has diarrhea from medicine  . Bradycardia   . Complication of anesthesia     HR dropped to 20  . PONV (postoperative nausea and vomiting)   . Seasonal allergies   . Sleep apnea     on CPAP    Past Surgical History  Procedure Laterality Date  . Total knee arthroplasty  08/05/07    right  . Colonoscopy    . Appendectomy  1970's  . Knee arthroscopy w/ meniscectomy Right 2007  . Total knee arthroplasty Left 07/01/2013    Procedure: LEFT TOTAL KNEE ARTHROPLASTY;  Surgeon: Mauri Pole, MD;  Location: [facility];  Service: Orthopedics;  Laterality: Left;    History   Social History  . Marital Status: Married    Spouse Name: N/A  . Number of Children: 1  . Years of Education: N/A   Occupational History  . retired 01-2014    Social History Main Topics  . Smoking status: Former Smoker -- 0.50 packs/day for 10 years    Types: Cigarettes    Quit date: 02/28/1980  . Smokeless tobacco: Never Used  . Alcohol Use: Yes     Comment: occasionally  . Drug Use: No  . Sexual Activity: Not on file   Other Topics Concern  . Not on file   Social History Narrative   1  child: lost child 01/2010     Husband disable         Medication List       This list is accurate as of: 09/07/14  5:51 PM.  Always use your most recent med list.               ALPRAZolam 0.5 MG tablet  Commonly known as:  XANAX  Take 1 tablet (0.5 mg total) by mouth 3 (three) times daily as needed for anxiety.     benazepril 40 MG tablet  Commonly known as:  LOTENSIN  Take 1 tablet (40 mg total) by mouth daily.     CALCIUM + D PO  Take 1 tablet by mouth daily.     cetirizine 10 MG tablet  Commonly known as:  ZYRTEC  Take 10 mg by mouth daily.     DSS 100 MG Caps  Take 100 mg by mouth 2 (two) times daily.     ferrous sulfate 325 (65 FE) MG tablet  Take 1 tablet (325 mg total) by mouth 3 (three) times daily after meals.     gabapentin 300 MG capsule  Commonly known as:  NEURONTIN  Take 1 capsule (300 mg total) by mouth at bedtime.     glimepiride 4  MG tablet  Commonly known as:  AMARYL  Take 1 tablet (4 mg total) by mouth daily with breakfast.     glucose blood test strip  Commonly known as:  ONE TOUCH ULTRA TEST  Use as instructed     hydrochlorothiazide 25 MG tablet  Commonly known as:  HYDRODIURIL  Take 1 tablet daily.     metFORMIN 1000 MG tablet  Commonly known as:  GLUCOPHAGE  Take 1 tablet (1,000 mg total) by mouth 2 (two) times daily with a meal.     metoprolol 50 MG tablet  Commonly known as:  LOPRESSOR  Take 1 tablet twice daily.     polyethylene glycol packet  Commonly known as:  MIRALAX / GLYCOLAX  Take 17 g by mouth daily as needed for mild constipation.     rosuvastatin 10 MG tablet  Commonly known as:  CRESTOR  Take 1 tablet (10 mg total) by mouth daily.     sertraline 50 MG tablet  Commonly known as:  ZOLOFT  Take 3 tablets (150 mg total) by mouth daily.     SYNTHROID 150 MCG tablet  Generic drug:  levothyroxine  Take 1 tablet (150 mcg total) by mouth daily.     Vitamin D (Ergocalciferol) 50000 UNITS Caps capsule  Commonly  known as:  DRISDOL  Take 1 capsule (50,000 Units total) by mouth every 7 (seven) days.           Objective:   Physical Exam BP 124/72 mmHg  Pulse 46  Temp(Src) 97.9 F (36.6 C) (Oral)  Ht 5\' 5"  (1.651 m)  Wt 262 lb 6 oz (119.013 kg)  BMI 43.66 kg/m2  SpO2 97% General:   Well developed, well nourished . NAD.  Lower extremities: No edema Skin: Not pale. Not jaundice Neurologic:  alert & oriented X3.  Speech normal, gait appropriate for age and unassisted Psych--  Cognition and judgment appear intact.  Cooperative with normal attention span and concentration.  Behavior appropriate. No anxious or depressed appearing.        Assessment & Plan:    Hypothyroidism-- good compliance with Synthroid, recheck a TSH  Hypertension Last creatinine slightly elevated, recheck them today  Refill Xanax

## 2014-09-07 NOTE — Assessment & Plan Note (Signed)
Diabetes Won't be able to get Janumet , plan: Increase glimepiride to 4 mg Continue with metformin 1000 milligrams twice a day (BMP pending)

## 2014-09-07 NOTE — Assessment & Plan Note (Signed)
Bradycardia - heart rate was in the 30s, now in the 40s, likely related to undertreated thyroid dz Reassess in 3 months

## 2014-09-07 NOTE — Patient Instructions (Signed)
Get your blood work before you leave    

## 2014-09-09 ENCOUNTER — Other Ambulatory Visit: Payer: Self-pay | Admitting: Internal Medicine

## 2014-10-09 ENCOUNTER — Ambulatory Visit: Payer: Medicare HMO | Admitting: Cardiology

## 2014-10-31 ENCOUNTER — Other Ambulatory Visit: Payer: Self-pay | Admitting: Internal Medicine

## 2014-11-05 ENCOUNTER — Telehealth: Payer: Self-pay | Admitting: Internal Medicine

## 2014-11-05 NOTE — Telephone Encounter (Signed)
If she is looking for seasickness treatment, she could try OTC Antivert. If she is trying to use the ear patches (scopolamine) I will ask her first to come for a BP and heart rate  check  >>> the last time she was here she was bradycardic.

## 2014-11-05 NOTE — Telephone Encounter (Signed)
Relation to KA:JGOT Call back number: [phone] Pharmacy: [id number] - [place], [place] [phone] (Phone) [phone] (Fax)         Reason for call:  Patient requesting a Rx for motion sickness, patient states she remember receiving 3 dots behind her ear and this helped. Please advise patient.

## 2014-11-05 NOTE — Telephone Encounter (Signed)
Received Pt's medication list and history, did not see medication drops for behind the ear for motion sickness. Please advise.

## 2014-11-06 NOTE — Telephone Encounter (Signed)
Pt returning your call [phone]

## 2014-11-06 NOTE — Telephone Encounter (Signed)
Spoke with Pt, informed her of Dr. Larose Kells recommendations. She is willing to try OTC Antivert first and will call to schedule appt to have BP and pulse rechecked if interested in Scopolamine.

## 2014-11-06 NOTE — Telephone Encounter (Signed)
Called Pt, informed Pt was not home at this time. Informed that they would have Pt call office when she returns.

## 2014-11-27 ENCOUNTER — Encounter: Payer: Self-pay | Admitting: Pulmonary Disease

## 2014-11-27 ENCOUNTER — Ambulatory Visit (INDEPENDENT_AMBULATORY_CARE_PROVIDER_SITE_OTHER): Payer: Medicare HMO | Admitting: Pulmonary Disease

## 2014-11-27 ENCOUNTER — Ambulatory Visit: Payer: BC Managed Care – PPO | Admitting: Pulmonary Disease

## 2014-11-27 VITALS — BP 140/78 | HR 49 | Temp 98.0°F | Ht 64.0 in | Wt 260.2 lb

## 2014-11-27 DIAGNOSIS — Z6841 Body Mass Index (BMI) 40.0 and over, adult: Secondary | ICD-10-CM

## 2014-11-27 DIAGNOSIS — G4733 Obstructive sleep apnea (adult) (pediatric): Secondary | ICD-10-CM | POA: Diagnosis not present

## 2014-11-27 DIAGNOSIS — Z9989 Dependence on other enabling machines and devices: Principal | ICD-10-CM

## 2014-11-27 NOTE — Patient Instructions (Signed)
Will get copy of CPAP report  Follow up in 1 year 

## 2014-11-27 NOTE — Progress Notes (Signed)
Chief Complaint  Patient presents with  . Follow-up    [name] pt states she is doing good. pt using CPAP evrery night for about 6 - 8 hours a night. mask and oressure good for pt. no concerns at this time.  no download available. DME: [facility]     History of Present Illness: [name] is a 64 y.o. female with OSA.  She has been doing well with CPAP.  She gets about 6 to 7 hrs sleep per night.  She sleeps through the night.  She feels rested during the day.  She uses nasal mask >> fits okay.  She is going to try working on her weight.   TESTS: PSG 03/26/01 >> AHI 55  PMhx >> Hypothyroidism, HLD, HTN, OA, DM, Depression, Allergies  Past surgical hx, Medications, Allergies, Family hx, Social hx all reviewed.   Physical Exam: BP 140/78 mmHg  Pulse 49  Temp(Src) 98 F (36.7 C) (Oral)  Ht 5\' 4"  (1.626 m)  Wt 260 lb 3.2 oz (118.026 kg)  BMI 44.64 kg/m2  SpO2 98%  General - No distress ENT - No sinus tenderness, no oral exudate, no LAN, MP 3 Cardiac - s1s2 regular, no murmur Chest - No wheeze/rales/dullness Back - No focal tenderness Abd - Soft, non-tender Ext - No edema Neuro - Normal strength Skin - No rashes Psych - normal mood, and behavior   Assessment/Plan:  Obstructive sleep apnea. She is compliant with CPAP and reports benefit. Plan: - continue CPAP - will get copy of her download  Obesity. Plan: - reviewed options to assist with weight loss   Chesley Mires, MD [facility] Pager:  [phone]

## 2014-11-30 DIAGNOSIS — G4733 Obstructive sleep apnea (adult) (pediatric): Secondary | ICD-10-CM | POA: Diagnosis not present

## 2014-12-07 ENCOUNTER — Ambulatory Visit (INDEPENDENT_AMBULATORY_CARE_PROVIDER_SITE_OTHER): Payer: Medicare HMO | Admitting: Internal Medicine

## 2014-12-07 ENCOUNTER — Encounter: Payer: Self-pay | Admitting: Internal Medicine

## 2014-12-07 VITALS — BP 126/78 | HR 44 | Temp 98.0°F | Ht 64.0 in | Wt 258.5 lb

## 2014-12-07 DIAGNOSIS — D649 Anemia, unspecified: Secondary | ICD-10-CM | POA: Diagnosis not present

## 2014-12-07 DIAGNOSIS — E119 Type 2 diabetes mellitus without complications: Secondary | ICD-10-CM | POA: Diagnosis not present

## 2014-12-07 DIAGNOSIS — Z114 Encounter for screening for human immunodeficiency virus [HIV]: Secondary | ICD-10-CM

## 2014-12-07 DIAGNOSIS — Z23 Encounter for immunization: Secondary | ICD-10-CM | POA: Diagnosis not present

## 2014-12-07 DIAGNOSIS — E559 Vitamin D deficiency, unspecified: Secondary | ICD-10-CM

## 2014-12-07 DIAGNOSIS — Z1159 Encounter for screening for other viral diseases: Secondary | ICD-10-CM

## 2014-12-07 DIAGNOSIS — Z09 Encounter for follow-up examination after completed treatment for conditions other than malignant neoplasm: Secondary | ICD-10-CM

## 2014-12-07 DIAGNOSIS — E039 Hypothyroidism, unspecified: Secondary | ICD-10-CM | POA: Diagnosis not present

## 2014-12-07 DIAGNOSIS — R69 Illness, unspecified: Secondary | ICD-10-CM | POA: Diagnosis not present

## 2014-12-07 LAB — HEMOGLOBIN A1C: HEMOGLOBIN A1C: 7.2 % — AB (ref 4.6–6.5)

## 2014-12-07 LAB — CBC WITH DIFFERENTIAL/PLATELET
BASOS ABS: 0 10*3/uL (ref 0.0–0.1)
Basophils Relative: 0.4 % (ref 0.0–3.0)
Eosinophils Absolute: 0.2 10*3/uL (ref 0.0–0.7)
Eosinophils Relative: 2.6 % (ref 0.0–5.0)
HCT: 37.9 % (ref 36.0–46.0)
Hemoglobin: 12.5 g/dL (ref 12.0–15.0)
Lymphocytes Relative: 23.1 % (ref 12.0–46.0)
Lymphs Abs: 1.5 10*3/uL (ref 0.7–4.0)
MCHC: 33 g/dL (ref 30.0–36.0)
MCV: 86.4 fl (ref 78.0–100.0)
MONO ABS: 0.4 10*3/uL (ref 0.1–1.0)
MONOS PCT: 6.8 % (ref 3.0–12.0)
NEUTROS PCT: 67.1 % (ref 43.0–77.0)
Neutro Abs: 4.3 10*3/uL (ref 1.4–7.7)
Platelets: 251 10*3/uL (ref 150.0–400.0)
RBC: 4.39 Mil/uL (ref 3.87–5.11)
RDW: 15.2 % (ref 11.5–15.5)
WBC: 6.4 10*3/uL (ref 4.0–10.5)

## 2014-12-07 LAB — TSH: TSH: 2.52 u[IU]/mL (ref 0.35–4.50)

## 2014-12-07 LAB — HEPATITIS C ANTIBODY: HCV Ab: NEGATIVE

## 2014-12-07 LAB — HM DIABETES FOOT EXAM: HM Diabetic Foot Exam: NORMAL

## 2014-12-07 MED ORDER — LEVOTHYROXINE SODIUM 150 MCG PO TABS: 150.0000 ug | ORAL_TABLET | Freq: Every day | ORAL | Status: DC

## 2014-12-07 MED ORDER — ROSUVASTATIN CALCIUM 10 MG PO TABS: 10.0000 mg | ORAL_TABLET | Freq: Every day | ORAL | Status: DC

## 2014-12-07 NOTE — Addendum Note (Signed)
Addended by: Wilfrid Lund on: 12/07/2014 03:08 PM   Modules accepted: Orders

## 2014-12-07 NOTE — Patient Instructions (Signed)
Get your blood work before you leave   Decrease metoprolol 50 mg to half tablet twice a day  Check the  blood pressurec weekly  Be sure your blood pressure is between 110/65 and  145/85.  if it is consistently higher or lower, let me know   Next visit  for a  routine checkup in 4 months, fasting  (30 minutes) Please schedule an appointment at the front desk

## 2014-12-07 NOTE — Assessment & Plan Note (Signed)
DM: Started glimepiride recently, low CBGs only if she skips a meal. Check a A1c, recommend avoid prolonged fasting HTN: controlled, she is a slightly bradycardic, decrease metoprolol to half dose today and watch BPs Hypothyroidism: on  medications, recheck a TSH, change rx to generic High cholesterol: Needs improvement, has not been doing well with diet and exercise lately, will recheck a FLP on return to the office, change Crestor to generic Vitamin D deficiency: Status post ergocalciferol, now on OTCs. Check labs History of post -op (knee surgery 2015)  anemia: On iron supplements, recommend to OTC supplements, check a CBC nail dystrophy: Recommend observation Primary care: Flu shot today RTC 4 months

## 2014-12-07 NOTE — Progress Notes (Signed)
Pre visit review using our clinic review tool, if applicable. No additional management support is needed unless otherwise documented below in the visit note. 

## 2014-12-07 NOTE — Progress Notes (Signed)
Subjective:    Patient ID: [name], female    DOB: 1950-09-12, 64 y.o.   MRN: [id number]  DOS:  12/07/2014 Type of visit - description : Routine visit Interval history: Diabetes, good compliance of medication, CBGs 130, 140. On one occasion he went below 100 because she skipped a meal. Has noted a discoloration on the left thumb nail, for few months. Not painful. High cholesterol: Requests to change Crestor prescription to a generic Hypothyroidism, requests  to change Synthroid to a generic    Review of Systems  No chest pain or difficulty breathing No nausea, vomiting. Occasional diarrhea without blood in the stools. No lower extremity paresthesias, does have RLS type of symptoms.  Past Medical History  Diagnosis Date  . Goiter     h/o  . Hypothyroidism     w/ h/o goiter  . Morbid obesity ([place])   . Dyslipidemia   . Hypertension   . Osteoarthritis   . Depression   . Diabetes mellitus     has diarrhea from medicine  . Bradycardia   . Complication of anesthesia     HR dropped to 20  . PONV (postoperative nausea and vomiting)   . Seasonal allergies   . Sleep apnea     on CPAP    Past Surgical History  Procedure Laterality Date  . Total knee arthroplasty  08/05/07    right  . Colonoscopy    . Appendectomy  1970's  . Knee arthroscopy w/ meniscectomy Right 2007  . Total knee arthroplasty Left 07/01/2013    Procedure: LEFT TOTAL KNEE ARTHROPLASTY;  Surgeon: Mauri Pole, MD;  Location: [facility];  Service: Orthopedics;  Laterality: Left;    Social History   Social History  . Marital Status: Married    Spouse Name: N/A  . Number of Children: 1  . Years of Education: N/A   Occupational History  . retired 01-2014    Social History Main Topics  . Smoking status: Former Smoker -- 0.50 packs/day for 10 years    Types: Cigarettes    Quit date: 02/28/1980  . Smokeless tobacco: Never Used  . Alcohol Use: Yes     Comment: occasionally  . Drug Use: No  .  Sexual Activity: Not on file   Other Topics Concern  . Not on file   Social History Narrative   1 child: lost child 01/2010     Husband disable         Medication List       This list is accurate as of: 12/07/14  1:39 PM.  Always use your most recent med list.               ALPRAZolam 0.5 MG tablet  Commonly known as:  XANAX  Take 1 tablet (0.5 mg total) by mouth 3 (three) times daily as needed for anxiety.     benazepril 40 MG tablet  Commonly known as:  LOTENSIN  Take 1 tablet (40 mg total) by mouth daily.     CALCIUM + D PO  Take 1 tablet by mouth daily.     cetirizine 10 MG tablet  Commonly known as:  ZYRTEC  Take 10 mg by mouth daily.     DSS 100 MG Caps  Take 100 mg by mouth 2 (two) times daily.     ferrous sulfate 325 (65 FE) MG tablet  Take 1 tablet (325 mg total) by mouth 3 (three) times daily after meals.  gabapentin 300 MG capsule  Commonly known as:  NEURONTIN  Take 1 capsule (300 mg total) by mouth at bedtime.     glimepiride 4 MG tablet  Commonly known as:  AMARYL  Take 1 tablet (4 mg total) by mouth daily with breakfast.     glucose blood test strip  Commonly known as:  ONE TOUCH ULTRA TEST  Use as instructed     hydrochlorothiazide 25 MG tablet  Commonly known as:  HYDRODIURIL  Take 1 tablet daily.     metFORMIN 1000 MG tablet  Commonly known as:  GLUCOPHAGE  Take 1 tablet (1,000 mg total) by mouth 2 (two) times daily with a meal.     metoprolol 50 MG tablet  Commonly known as:  LOPRESSOR  Take 25 mg by mouth 2 (two) times daily.     polyethylene glycol packet  Commonly known as:  MIRALAX / GLYCOLAX  Take 17 g by mouth daily as needed for mild constipation.     rosuvastatin 10 MG tablet  Commonly known as:  CRESTOR  Take 1 tablet (10 mg total) by mouth daily.     sertraline 50 MG tablet  Commonly known as:  ZOLOFT  Take 3 tablets (150 mg total) by mouth daily.     SYNTHROID 150 MCG tablet  Generic drug:  levothyroxine    Take 1 tablet (150 mcg total) by mouth daily before breakfast.           Objective:   Physical Exam BP 126/78 mmHg  Pulse 44  Temp(Src) 98 F (36.7 C) (Oral)  Ht 5\' 4"  (1.626 m)  Wt 258 lb 8 oz (117.255 kg)  BMI 44.35 kg/m2  SpO2 97% General:   Well developed, well nourished . NAD.  HEENT:  Normocephalic . Face symmetric, atraumatic Lungs:  CTA B Normal respiratory effort, no intercostal retractions, no accessory muscle use. Heart: RRR,  no murmur.  No pretibial edema bilaterally  Skin: Not pale. Not jaundice. Left thumb nail  has a slightly dark, 2 mm line . Skin is free of any changes. Diabetic feet exam: Normal pedal pulses, pinprick examination normal. Neurologic:  alert & oriented X3.  Speech normal, gait appropriate for age and unassisted Psych--  Cognition and judgment appear intact.  Cooperative with normal attention span and concentration.  Behavior appropriate. No anxious or depressed appearing.      Assessment & Plan:   Assessment >  DM, feet check (-) 11-2014  HTN Hyperlipidemia Hypothyroidism, h/o goiter ?RLS-- sx decreased w/ gabapentin (2016) Depression Morbid obesity DJD OSA on CPAP Dr Halford Chessman Bradycardia after anesthesia  Plan  DM: Started glimepiride recently, low CBGs only if she skips a meal. Check a A1c, recommend avoid prolonged fasting HTN: controlled, she is a slightly bradycardic, decrease metoprolol to half dose today and watch BPs Hypothyroidism: on  medications, recheck a TSH, change rx to generic High cholesterol: Needs improvement, has not been doing well with diet and exercise lately, will recheck a FLP on return to the office, change Crestor to generic Vitamin D deficiency: Status post ergocalciferol, now on OTCs. Check labs History of post -op (knee surgery 2015)  anemia: On iron supplements, recommend to OTC supplements, check a CBC nail dystrophy: Recommend observation Primary care: Flu shot today RTC 4 months

## 2014-12-08 DIAGNOSIS — S61210A Laceration without foreign body of right index finger without damage to nail, initial encounter: Secondary | ICD-10-CM | POA: Diagnosis not present

## 2014-12-08 DIAGNOSIS — Z79899 Other long term (current) drug therapy: Secondary | ICD-10-CM | POA: Diagnosis not present

## 2014-12-08 DIAGNOSIS — I10 Essential (primary) hypertension: Secondary | ICD-10-CM | POA: Diagnosis not present

## 2014-12-08 DIAGNOSIS — W458XXA Other foreign body or object entering through skin, initial encounter: Secondary | ICD-10-CM | POA: Diagnosis not present

## 2014-12-08 DIAGNOSIS — E119 Type 2 diabetes mellitus without complications: Secondary | ICD-10-CM | POA: Diagnosis not present

## 2014-12-08 DIAGNOSIS — E7800 Pure hypercholesterolemia, unspecified: Secondary | ICD-10-CM | POA: Diagnosis not present

## 2014-12-08 LAB — HIV ANTIBODY (ROUTINE TESTING W REFLEX): HIV 1&2 Ab, 4th Generation: NONREACTIVE

## 2014-12-09 ENCOUNTER — Telehealth: Payer: Self-pay | Admitting: Pulmonary Disease

## 2014-12-09 LAB — VITAMIN D 1,25 DIHYDROXY
VITAMIN D3 1, 25 (OH): 15 pg/mL
Vitamin D 1, 25 (OH)2 Total: 15 pg/mL — ABNORMAL LOW (ref 18–72)
Vitamin D2 1, 25 (OH)2: <8 pg/mL

## 2014-12-09 NOTE — Telephone Encounter (Signed)
CPAP 11/01/14 to 11/30/14 >> Used on 30 of 39 nights with average 5 hrs 55 min.  Will have my nurse inform pt that CPAP report shows adequate usage.

## 2014-12-10 MED ORDER — VITAMIN D (ERGOCALCIFEROL) 1.25 MG (50000 UNIT) PO CAPS: 50000.0000 [IU] | ORAL_CAPSULE | ORAL | Status: DC

## 2014-12-10 MED ORDER — PIOGLITAZONE HCL 30 MG PO TABS: 30.0000 mg | ORAL_TABLET | Freq: Every day | ORAL | Status: DC

## 2014-12-10 NOTE — Telephone Encounter (Signed)
Pt is aware of CPAP report results. Nothing further was needed at this time.

## 2014-12-10 NOTE — Addendum Note (Signed)
Addended by: Wilfrid Lund on: 12/10/2014 01:57 PM   Modules accepted: Orders

## 2014-12-28 ENCOUNTER — Telehealth: Payer: Self-pay | Admitting: Internal Medicine

## 2014-12-28 ENCOUNTER — Other Ambulatory Visit: Payer: Self-pay

## 2014-12-28 NOTE — Telephone Encounter (Signed)
Awaiting form

## 2014-12-28 NOTE — Telephone Encounter (Signed)
Caller name: Shabendi  Relation to pt: [facility]  Call back number: [phone] ext [phone]    Reason for call:  Requesting orders for Diabetic supplies will refax to [phone]

## 2014-12-29 NOTE — Telephone Encounter (Signed)
Received fax confirmation on 12/29/2014 at 1036.

## 2014-12-29 NOTE — Telephone Encounter (Signed)
Received form, completed. Awaiting MD signature.

## 2014-12-29 NOTE — Telephone Encounter (Signed)
Form faxed to [facility] at [phone]. Form sent for scanning.

## 2014-12-30 ENCOUNTER — Other Ambulatory Visit: Payer: Self-pay | Admitting: Internal Medicine

## 2014-12-30 DIAGNOSIS — R69 Illness, unspecified: Secondary | ICD-10-CM | POA: Diagnosis not present

## 2014-12-30 NOTE — Telephone Encounter (Signed)
Okay #30 and 6 refills 

## 2014-12-30 NOTE — Telephone Encounter (Signed)
Pt is requesting refill on Gabapentin.  Last OV: 12/07/2014 Last Fill: 08/06/2014 #30 and 3 RF  Okay to refill?

## 2014-12-30 NOTE — Telephone Encounter (Signed)
Rx sent 

## 2015-02-15 DIAGNOSIS — R69 Illness, unspecified: Secondary | ICD-10-CM | POA: Diagnosis not present

## 2015-03-05 ENCOUNTER — Telehealth: Payer: Self-pay | Admitting: Internal Medicine

## 2015-03-05 MED ORDER — METOPROLOL TARTRATE 50 MG PO TABS: 25.0000 mg | ORAL_TABLET | Freq: Two times a day (BID) | ORAL | Status: DC

## 2015-03-05 NOTE — Telephone Encounter (Signed)
Rx sent, #30 and 3RF.

## 2015-03-05 NOTE — Telephone Encounter (Signed)
Relation to [id number] Call back [place]: [facility] [address] - [place], [place] [phone] (Phone) [phone] (Fax)         Reason for call:  Patient requesting a refill metoprolol (LOPRESSOR) 50 MG tablet

## 2015-04-01 DIAGNOSIS — R69 Illness, unspecified: Secondary | ICD-10-CM | POA: Diagnosis not present

## 2015-04-06 DIAGNOSIS — G4733 Obstructive sleep apnea (adult) (pediatric): Secondary | ICD-10-CM | POA: Diagnosis not present

## 2015-05-04 ENCOUNTER — Ambulatory Visit: Payer: Medicare HMO | Admitting: Internal Medicine

## 2015-05-06 ENCOUNTER — Other Ambulatory Visit: Payer: Self-pay | Admitting: Internal Medicine

## 2015-05-13 ENCOUNTER — Telehealth: Payer: Self-pay | Admitting: Internal Medicine

## 2015-05-13 ENCOUNTER — Ambulatory Visit (INDEPENDENT_AMBULATORY_CARE_PROVIDER_SITE_OTHER): Payer: Medicare HMO | Admitting: Internal Medicine

## 2015-05-13 ENCOUNTER — Encounter: Payer: Self-pay | Admitting: Internal Medicine

## 2015-05-13 VITALS — BP 118/64 | HR 54 | Temp 97.8°F | Ht 64.0 in | Wt 255.2 lb

## 2015-05-13 DIAGNOSIS — E785 Hyperlipidemia, unspecified: Secondary | ICD-10-CM

## 2015-05-13 DIAGNOSIS — E1165 Type 2 diabetes mellitus with hyperglycemia: Secondary | ICD-10-CM

## 2015-05-13 DIAGNOSIS — F32A Depression, unspecified: Secondary | ICD-10-CM

## 2015-05-13 DIAGNOSIS — I10 Essential (primary) hypertension: Secondary | ICD-10-CM | POA: Diagnosis not present

## 2015-05-13 DIAGNOSIS — Z09 Encounter for follow-up examination after completed treatment for conditions other than malignant neoplasm: Secondary | ICD-10-CM

## 2015-05-13 DIAGNOSIS — E039 Hypothyroidism, unspecified: Secondary | ICD-10-CM

## 2015-05-13 DIAGNOSIS — F329 Major depressive disorder, single episode, unspecified: Secondary | ICD-10-CM

## 2015-05-13 LAB — BASIC METABOLIC PANEL
BUN: 24 mg/dL — ABNORMAL HIGH (ref 6–23)
CHLORIDE: 108 meq/L (ref 96–112)
CO2: 21 meq/L (ref 19–32)
CREATININE: 1.03 mg/dL (ref 0.40–1.20)
Calcium: 9.4 mg/dL (ref 8.4–10.5)
GFR: 57 mL/min — ABNORMAL LOW (ref >60.00)
GLUCOSE: 153 mg/dL — AB (ref 70–99)
Potassium: 4.2 mEq/L (ref 3.5–5.1)
Sodium: 138 mEq/L (ref 135–145)

## 2015-05-13 LAB — LIPID PANEL
CHOL/HDL RATIO: 4
CHOLESTEROL: 165 mg/dL (ref 0–200)
HDL: 39 mg/dL — AB (ref >39.00)
NonHDL: 125.72
Triglycerides: 241 mg/dL — ABNORMAL HIGH (ref 0.0–149.0)
VLDL: 48.2 mg/dL — AB (ref 0.0–40.0)

## 2015-05-13 LAB — TSH: TSH: 1.42 u[IU]/mL (ref 0.35–4.50)

## 2015-05-13 LAB — LDL CHOLESTEROL, DIRECT: LDL DIRECT: 95 mg/dL

## 2015-05-13 LAB — HEMOGLOBIN A1C: HEMOGLOBIN A1C: 7.7 % — AB (ref 4.6–6.5)

## 2015-05-13 LAB — AST: AST: 12 U/L (ref 0–37)

## 2015-05-13 LAB — ALT: ALT: 10 U/L (ref 0–35)

## 2015-05-13 NOTE — Progress Notes (Signed)
Pre visit review using our clinic review tool, if applicable. No additional management support is needed unless otherwise documented below in the visit note. 

## 2015-05-13 NOTE — Progress Notes (Signed)
Subjective:    Patient ID: [name], female    DOB: [birth year], 65 y.o.   MRN: [id number]  DOS:  05/13/2015 Type of visit - description : Follow-up Interval history: Diabetes: Unsure if she is taking Actos, diet needs improvement, she takes occasional walks. CBGs in the 160s Vitamin D deficiency: Finish ergocalciferol. Depression: Increase lately, she has several lost in her family. No suicidal. HTN: Good compliance of medication, ambulatory BP satisfactory High cholesterol: On Crestor, due for labs   Review of Systems  Denies chest or difficulty breathing No nausea, vomiting, diarrhea. No suicidal ideas.  Past Medical History  Diagnosis Date  . Goiter     h/o  . Hypothyroidism     w/ h/o goiter  . Morbid obesity ([place])   . Dyslipidemia   . Hypertension   . Osteoarthritis   . Depression   . Diabetes mellitus     has diarrhea from medicine  . Bradycardia   . Complication of anesthesia     HR dropped to 20  . PONV (postoperative nausea and vomiting)   . Seasonal allergies   . Sleep apnea     on CPAP    Past Surgical History  Procedure Laterality Date  . Total knee arthroplasty  08/05/07    right  . Colonoscopy    . Appendectomy  1970's  . Knee arthroscopy w/ meniscectomy Right 2007  . Total knee arthroplasty Left 07/01/2013    Procedure: LEFT TOTAL KNEE ARTHROPLASTY;  Surgeon: Mauri Pole, MD;  Location: [facility];  Service: Orthopedics;  Laterality: Left;    Social History   Social History  . Marital Status: Married    Spouse Name: N/A  . Number of Children: 1  . Years of Education: N/A   Occupational History  . retired 01-2014    Social History Main Topics  . Smoking status: Former Smoker -- 0.50 packs/day for 10 years    Types: Cigarettes    Quit date: 02/28/1980  . Smokeless tobacco: Never Used  . Alcohol Use: Yes     Comment: occasionally  . Drug Use: No  . Sexual Activity: Not on file   Other Topics Concern  . Not on file   Social  History Narrative   1 child: lost child 01/2010     Lost brother ~ 37   Lost parents ~ 2016   Husband disable    Sister lives in [place], extended family spread throughout  [place]        Medication List       This list is accurate as of: 05/13/15 11:59 PM.  Always use your most recent med list.               ALPRAZolam 0.5 MG tablet  Commonly known as:  XANAX  Take 1 tablet (0.5 mg total) by mouth 3 (three) times daily as needed for anxiety.     aspirin 81 MG tablet  Take 81 mg by mouth daily.     benazepril 40 MG tablet  Commonly known as:  LOTENSIN  Take 1 tablet (40 mg total) by mouth daily.     CALCIUM + D PO  Take 1 tablet by mouth daily.     cetirizine 10 MG tablet  Commonly known as:  ZYRTEC  Take 10 mg by mouth daily.     ferrous sulfate 325 (65 FE) MG tablet  Take 1 tablet (325 mg total) by mouth 3 (three) times daily after meals.  gabapentin 300 MG capsule  Commonly known as:  NEURONTIN  Take 1 capsule (300 mg total) by mouth at bedtime.     glimepiride 4 MG tablet  Commonly known as:  AMARYL  Take 1 tablet (4 mg total) by mouth daily with breakfast.     glucose blood test strip  Commonly known as:  ONE TOUCH ULTRA TEST  Use as instructed     hydrochlorothiazide 25 MG tablet  Commonly known as:  HYDRODIURIL  Take 1 tablet daily.     levothyroxine 150 MCG tablet  Commonly known as:  SYNTHROID, LEVOTHROID  Take 1 tablet (150 mcg total) by mouth daily before breakfast.     metFORMIN 1000 MG tablet  Commonly known as:  GLUCOPHAGE  Take 1 tablet (1,000 mg total) by mouth 2 (two) times daily with a meal.     metoprolol 50 MG tablet  Commonly known as:  LOPRESSOR  Take 0.5 tablets (25 mg total) by mouth 2 (two) times daily.     pioglitazone 30 MG tablet  Commonly known as:  ACTOS  Take 1 tablet (30 mg total) by mouth daily.     rosuvastatin 10 MG tablet  Commonly known as:  CRESTOR  Take 1 tablet (10 mg total) by mouth daily.      sertraline 50 MG tablet  Commonly known as:  ZOLOFT  Take 3 tablets (150 mg total) by mouth daily.           Objective:   Physical Exam BP 118/64 mmHg  Pulse 54  Temp(Src) 97.8 F (36.6 C) (Oral)  Ht 5\' 4"  (1.626 m)  Wt 255 lb 4 oz (115.781 kg)  BMI 43.79 kg/m2  SpO2 98% General:   Well developed, well nourished . NAD.  HEENT:  Normocephalic . Face symmetric, atraumatic Lungs:  CTA B Normal respiratory effort, no intercostal retractions, no accessory muscle use. Heart: RRR,  no murmur.  No pretibial edema bilaterally  Skin: Left thumb: Has a 1 mm line that goes from the base of the nail to the tip. Cuticula and the skin normal Neurologic:  alert & oriented X3.  Speech normal, gait appropriate for age and unassisted Psych--  Cognition and judgment appear intact.  Cooperative with normal attention span and concentration.  Behavior appropriate. No anxious, slt  depressed appearing.      Assessment & Plan:   Assessment >  DM, feet check (-) 11-2014  HTN Hyperlipidemia Hypothyroidism, h/o goiter ?RLS-- sx decreased w/ gabapentin (2016) Vit D def  Depression Morbid obesity DJD OSA on CPAP Dr Halford Chessman Bradycardia after anesthesia  PLAN: DM: On metformin, glimepiride, unsure if she started Actos. Will check labs, patient to let me know about Actos HTN: on a  slightly decrease dose of metoprolol, ambulatory BPs normal, check a BMP. No change Hypothyroidism: Recheck a TSH Vitamin D deficiency:  continue OTCs Depression: Slightly worse lately, on Zoloft 150 mg lately. Options: Increase Zoloft, add Wellbutrin, change to Cymbalta. Patient would let me know if interested. Also encourage formal counseling. Information provided Left thumb: Dystrophic nail, skin and cuticule normal, continue observation. Also complaining of left right elbow pain: Recommend to see orthopedic Primary care: Start aspirin 81 RTC 3 months

## 2015-05-13 NOTE — Telephone Encounter (Signed)
Caller name: Self  Can be [phone] Pharmacy:   [id number] - [place], [address] [phone] (Phone) [phone] (Fax)         Reason for call: FYI: Patient is not taking Actos but would like to have the rx called in for her

## 2015-05-13 NOTE — Telephone Encounter (Signed)
Please advise 

## 2015-05-13 NOTE — Patient Instructions (Addendum)
GO TO THE LAB :      Get the blood work     GO TO THE FRONT DESK Schedule your next appointment for a  Routine check up When?   3 months  Fasting?  No   --------------------------- Let us know if you are taking Actos  Please see the orthopedic doctor at [facility] orthopedics  Consider see one of our counselors  Start aspirin 81 mg daily

## 2015-05-14 NOTE — Assessment & Plan Note (Signed)
DM: On metformin, glimepiride, unsure if she started Actos. Will check labs, patient to let me know about Actos HTN: on a  slightly decrease dose of metoprolol, ambulatory BPs normal, check a BMP. No change Hypothyroidism: Recheck a TSH Vitamin D deficiency:  continue OTCs Depression: Slightly worse lately, on Zoloft 150 mg lately. Options: Increase Zoloft, add Wellbutrin, change to Cymbalta. Patient would let me know if interested. Also encourage formal counseling. Information provided Left thumb: Dystrophic nail, skin and cuticule normal, continue observation. Also complaining of left right elbow pain: Recommend to see orthopedic Primary care: Start aspirin 81 RTC 3 months

## 2015-05-16 NOTE — Telephone Encounter (Signed)
Advise patient: Cholesterol is okay, diabetes needs better control. Add Actos 30 mg 1 po qd , #30, 6 RF Watch for leg swelling, work on diet and exercise.

## 2015-05-17 MED ORDER — PIOGLITAZONE HCL 30 MG PO TABS: 30.0000 mg | ORAL_TABLET | Freq: Every day | ORAL | Status: DC

## 2015-05-17 NOTE — Telephone Encounter (Signed)
Rx sent 

## 2015-05-17 NOTE — Telephone Encounter (Signed)
Pt returning call.   CB: [phone]

## 2015-05-17 NOTE — Telephone Encounter (Signed)
Tried calling Pt, received phone squeal. Will try again later.

## 2015-05-17 NOTE — Telephone Encounter (Signed)
Tried calling Pt to let her know Actos has been sent to pharmacy. No answer, unable to leave message.

## 2015-05-24 DIAGNOSIS — R69 Illness, unspecified: Secondary | ICD-10-CM | POA: Diagnosis not present

## 2015-06-04 ENCOUNTER — Encounter: Payer: Self-pay | Admitting: Internal Medicine

## 2015-06-04 ENCOUNTER — Ambulatory Visit (INDEPENDENT_AMBULATORY_CARE_PROVIDER_SITE_OTHER): Payer: Medicare HMO | Admitting: Internal Medicine

## 2015-06-04 VITALS — BP 132/84 | HR 53 | Temp 98.3°F | Ht 64.0 in | Wt 260.5 lb

## 2015-06-04 DIAGNOSIS — J069 Acute upper respiratory infection, unspecified: Secondary | ICD-10-CM

## 2015-06-04 DIAGNOSIS — Z09 Encounter for follow-up examination after completed treatment for conditions other than malignant neoplasm: Secondary | ICD-10-CM

## 2015-06-04 MED ORDER — AZITHROMYCIN 250 MG PO TABS: ORAL_TABLET | ORAL | Status: DC

## 2015-06-04 NOTE — Progress Notes (Signed)
Pre visit review using our clinic review tool, if applicable. No additional management support is needed unless otherwise documented below in the visit note. 

## 2015-06-04 NOTE — Patient Instructions (Addendum)
Rest, fluids , tylenol  For cough:  Take Mucinex DM twice a day as needed until better  For nasal congestion: Use OTC Nasocort or Flonase : 2 nasal sprays on each side of the nose in the morning until you feel better    Avoid decongestants such as  Pseudoephedrine or phenylephrine     Take the antibiotic as prescribed  (zithromax) only if no better in 3-4 days   Call if not gradually better over the next  10 days  Call anytime if the symptoms are severe, or if you have wheezing

## 2015-06-04 NOTE — Progress Notes (Signed)
Subjective:    Patient ID: [name], female    DOB: 04/06/1950, 65 y.o.   MRN: [id number]  DOS:  06/04/2015 Type of visit - description : Acute visit Interval history: Symptoms started 2 or 3 weeks ago when she developed sinus congestion, sneezing, symptoms better with Zyrtec. 5 days ago the symptoms change: Increase cough, scratchy throat, achy in the shoulders and neck.  Review of Systems  Denies fever chills No itchy eyes or nose No nausea or vomiting Small amount of sputum production, color? Has not hear any wheezing.  Past Medical History  Diagnosis Date  . Goiter     h/o  . Hypothyroidism     w/ h/o goiter  . Morbid obesity ([place])   . Dyslipidemia   . Hypertension   . Osteoarthritis   . Depression   . Diabetes mellitus     has diarrhea from medicine  . Bradycardia   . Complication of anesthesia     HR dropped to 20  . PONV (postoperative nausea and vomiting)   . Seasonal allergies   . Sleep apnea     on CPAP    Past Surgical History  Procedure Laterality Date  . Total knee arthroplasty  08/05/07    right  . Colonoscopy    . Appendectomy  1970's  . Knee arthroscopy w/ meniscectomy Right 2007  . Total knee arthroplasty Left 07/01/2013    Procedure: LEFT TOTAL KNEE ARTHROPLASTY;  Surgeon: Mauri Pole, MD;  Location: [facility];  Service: Orthopedics;  Laterality: Left;    Social History   Social History  . Marital Status: Married    Spouse Name: N/A  . Number of Children: 1  . Years of Education: N/A   Occupational History  . retired 01-2014    Social History Main Topics  . Smoking status: Former Smoker -- 0.50 packs/day for 10 years    Types: Cigarettes    Quit date: 02/28/1980  . Smokeless tobacco: Never Used  . Alcohol Use: Yes     Comment: occasionally  . Drug Use: No  . Sexual Activity: Not on file   Other Topics Concern  . Not on file   Social History Narrative   1 child: lost child 01/2010     Lost brother ~ 88   Lost parents  ~ 2016   Husband disable    Sister lives in [place], extended family spread throughout  [place]        Medication List       This list is accurate as of: 06/04/15 11:59 PM.  Always use your most recent med list.               ALPRAZolam 0.5 MG tablet  Commonly known as:  XANAX  Take 1 tablet (0.5 mg total) by mouth 3 (three) times daily as needed for anxiety.     aspirin 81 MG tablet  Take 81 mg by mouth daily.     azithromycin 250 MG tablet  Commonly known as:  ZITHROMAX Z-PAK  2 tabs a day the first day, then 1 tab a day x 4 days     benazepril 40 MG tablet  Commonly known as:  LOTENSIN  Take 1 tablet (40 mg total) by mouth daily.     CALCIUM + D PO  Take 1 tablet by mouth daily.     cetirizine 10 MG tablet  Commonly known as:  ZYRTEC  Take 10 mg by mouth daily.  ferrous sulfate 325 (65 FE) MG tablet  Take 1 tablet (325 mg total) by mouth 3 (three) times daily after meals.     gabapentin 300 MG capsule  Commonly known as:  NEURONTIN  Take 1 capsule (300 mg total) by mouth at bedtime.     glimepiride 4 MG tablet  Commonly known as:  AMARYL  Take 1 tablet (4 mg total) by mouth daily with breakfast.     glucose blood test strip  Commonly known as:  ONE TOUCH ULTRA TEST  Use as instructed     hydrochlorothiazide 25 MG tablet  Commonly known as:  HYDRODIURIL  Take 1 tablet daily.     levothyroxine 150 MCG tablet  Commonly known as:  SYNTHROID, LEVOTHROID  Take 1 tablet (150 mcg total) by mouth daily before breakfast.     metFORMIN 1000 MG tablet  Commonly known as:  GLUCOPHAGE  Take 1 tablet (1,000 mg total) by mouth 2 (two) times daily with a meal.     metoprolol 50 MG tablet  Commonly known as:  LOPRESSOR  Take 0.5 tablets (25 mg total) by mouth 2 (two) times daily.     pioglitazone 30 MG tablet  Commonly known as:  ACTOS  Take 1 tablet (30 mg total) by mouth daily.     rosuvastatin 10 MG tablet  Commonly known as:  CRESTOR  Take 1 tablet (10 mg  total) by mouth daily.     sertraline 50 MG tablet  Commonly known as:  ZOLOFT  Take 3 tablets (150 mg total) by mouth daily.           Objective:   Physical Exam BP 132/84 mmHg  Pulse 53  Temp(Src) 98.3 F (36.8 C) (Oral)  Ht 5\' 4"  (1.626 m)  Wt 260 lb 8 oz (118.162 kg)  BMI 44.69 kg/m2  SpO2 97% General:   Well developed, well nourished . NAD.  HEENT:  Normocephalic . Face symmetric, atraumatic. TMs normal Nose is slightly congested, sinuses no TTP Throat symmetric. Lungs:  CTA B, no wheezing, slightly increased expiratory time? Normal respiratory effort, no intercostal retractions, no accessory muscle use. Heart: RRR,  no murmur.  No pretibial edema bilaterally  Skin: Not pale. Not jaundice Neurologic:  alert & oriented X3.  Speech normal, gait appropriate for age and unassisted Psych--  Cognition and judgment appear intact.  Cooperative with normal attention span and concentration.  Behavior appropriate. No anxious or depressed appearing.      Assessment & Plan:   Assessment >  DM, feet check (-) 11-2014  HTN Hyperlipidemia Hypothyroidism, h/o goiter ?RLS-- sx decreased w/ gabapentin (2016) Vit D def  Depression Morbid obesity DJD OSA on CPAP Dr Halford Chessman Bradycardia after anesthesia  PLAN: URI, bronchospasm? Recommend conservative treatment, C instructions. If no better start a Z-Pak. Also, watch for bronchospasm, if that is the case will call for a alb Rx. No history of asthma previously Next visit by 07/2015

## 2015-06-05 NOTE — Assessment & Plan Note (Signed)
URI, bronchospasm? Recommend conservative treatment, C instructions. If no better start a Z-Pak. Also, watch for bronchospasm, if that is the case will call for a alb Rx. No history of asthma previously Next visit by 07/2015

## 2015-06-06 ENCOUNTER — Other Ambulatory Visit: Payer: Self-pay | Admitting: Internal Medicine

## 2015-06-23 ENCOUNTER — Other Ambulatory Visit: Payer: Self-pay | Admitting: Internal Medicine

## 2015-06-23 NOTE — Telephone Encounter (Signed)
Rx request to pharmacy/SLS  

## 2015-06-26 DIAGNOSIS — M109 Gout, unspecified: Secondary | ICD-10-CM | POA: Diagnosis not present

## 2015-06-30 DIAGNOSIS — H52223 Regular astigmatism, bilateral: Secondary | ICD-10-CM | POA: Diagnosis not present

## 2015-07-08 DIAGNOSIS — G4733 Obstructive sleep apnea (adult) (pediatric): Secondary | ICD-10-CM | POA: Diagnosis not present

## 2015-07-14 ENCOUNTER — Other Ambulatory Visit: Payer: Self-pay | Admitting: Internal Medicine

## 2015-07-15 ENCOUNTER — Other Ambulatory Visit: Payer: Self-pay | Admitting: Internal Medicine

## 2015-07-19 ENCOUNTER — Other Ambulatory Visit: Payer: Self-pay

## 2015-07-19 MED ORDER — METFORMIN HCL 1000 MG PO TABS: 1000.0000 mg | ORAL_TABLET | Freq: Two times a day (BID) | ORAL | Status: DC

## 2015-08-03 ENCOUNTER — Telehealth: Payer: Self-pay | Admitting: Internal Medicine

## 2015-08-03 MED ORDER — HYDROCHLOROTHIAZIDE 25 MG PO TABS: 25.0000 mg | ORAL_TABLET | Freq: Every day | ORAL | Status: DC

## 2015-08-03 NOTE — Telephone Encounter (Signed)
Can be reached: [phone] Pharmacy: [facility] [id number] - [place], [address] [place]   Reason for call: pt called for refill on hydrochlorothiazide. She states she called [place] last week but I don't see notes. She is now out of meds. Please send in a 90 day supply for her.

## 2015-08-03 NOTE — Telephone Encounter (Signed)
Rx sent 

## 2015-08-13 ENCOUNTER — Encounter: Payer: Self-pay | Admitting: Internal Medicine

## 2015-08-13 ENCOUNTER — Ambulatory Visit (INDEPENDENT_AMBULATORY_CARE_PROVIDER_SITE_OTHER): Payer: Medicare HMO | Admitting: Internal Medicine

## 2015-08-13 VITALS — BP 145/49 | HR 46 | Temp 98.0°F | Ht 64.0 in | Wt 264.2 lb

## 2015-08-13 DIAGNOSIS — F32A Depression, unspecified: Secondary | ICD-10-CM

## 2015-08-13 DIAGNOSIS — R69 Illness, unspecified: Secondary | ICD-10-CM | POA: Diagnosis not present

## 2015-08-13 DIAGNOSIS — I10 Essential (primary) hypertension: Secondary | ICD-10-CM | POA: Diagnosis not present

## 2015-08-13 DIAGNOSIS — E118 Type 2 diabetes mellitus with unspecified complications: Secondary | ICD-10-CM | POA: Diagnosis not present

## 2015-08-13 DIAGNOSIS — F329 Major depressive disorder, single episode, unspecified: Secondary | ICD-10-CM

## 2015-08-13 DIAGNOSIS — M10079 Idiopathic gout, unspecified ankle and foot: Secondary | ICD-10-CM | POA: Diagnosis not present

## 2015-08-13 DIAGNOSIS — M79672 Pain in left foot: Secondary | ICD-10-CM

## 2015-08-13 DIAGNOSIS — M79671 Pain in right foot: Secondary | ICD-10-CM | POA: Diagnosis not present

## 2015-08-13 DIAGNOSIS — M1990 Unspecified osteoarthritis, unspecified site: Secondary | ICD-10-CM

## 2015-08-13 DIAGNOSIS — M109 Gout, unspecified: Secondary | ICD-10-CM

## 2015-08-13 LAB — URIC ACID: Uric Acid, Serum: 6.6 mg/dL (ref 2.4–7.0)

## 2015-08-13 LAB — AST: AST: 14 U/L (ref 0–37)

## 2015-08-13 LAB — ALT: ALT: 13 U/L (ref 0–35)

## 2015-08-13 LAB — HEMOGLOBIN A1C: Hgb A1c MFr Bld: 6.4 % (ref 4.6–6.5)

## 2015-08-13 NOTE — Progress Notes (Signed)
Pre visit review using our clinic review tool, if applicable. No additional management support is needed unless otherwise documented below in the visit note. 

## 2015-08-13 NOTE — Patient Instructions (Signed)
GO TO THE LAB : Get the blood work     GO TO THE FRONT DESK Schedule your next appointment for a  complete physical exam in 4 months, fasting

## 2015-08-13 NOTE — Progress Notes (Signed)
Subjective:    Patient ID: [name], female    DOB: 06/20/1950, 65 y.o.   MRN: [id number]  DOS:  08/13/2015 Type of visit - description : Routine checkup Interval history: Diabetes: Started Actos, no apparent side effects, due for A1c HTN: BP 145/49 today, at home in the 130s, 140s. Good med compliance Gout: Went to another clinic with swelling, redness at the L great toe, diagnosed with gout. Better after colchicine, now reports on and off chronic pain at the base of the great toe bilaterally.  Depression: Feeling better.  BP Readings from Last 3 Encounters:  08/13/15 145/49  06/04/15 132/84  05/13/15 118/64      Review of Systems  Denies chest pain or difficulty breathing No nausea, vomiting, diarrhea Trying to improve her diet. Past Medical History  Diagnosis Date  . Goiter     h/o  . Hypothyroidism     w/ h/o goiter  . Morbid obesity ([place])   . Dyslipidemia   . Hypertension   . Osteoarthritis   . Depression   . Diabetes mellitus     has diarrhea from medicine  . Bradycardia   . Complication of anesthesia     HR dropped to 20  . PONV (postoperative nausea and vomiting)   . Seasonal allergies   . Sleep apnea     on CPAP    Past Surgical History  Procedure Laterality Date  . Total knee arthroplasty  08/05/07    right  . Colonoscopy    . Appendectomy  1970's  . Knee arthroscopy w/ meniscectomy Right 2007  . Total knee arthroplasty Left 07/01/2013    Procedure: LEFT TOTAL KNEE ARTHROPLASTY;  Surgeon: Mauri Pole, MD;  Location: [facility];  Service: Orthopedics;  Laterality: Left;    Social History   Social History  . Marital Status: Married    Spouse Name: N/A  . Number of Children: 1  . Years of Education: N/A   Occupational History  . retired 01-2014    Social History Main Topics  . Smoking status: Former Smoker -- 0.50 packs/day for 10 years    Types: Cigarettes    Quit date: 02/28/1980  . Smokeless tobacco: Never Used  . Alcohol Use:  Yes     Comment: occasionally  . Drug Use: No  . Sexual Activity: Not on file   Other Topics Concern  . Not on file   Social History Narrative   1 child: lost child 01/2010     Lost brother ~ 49   Lost parents ~ 2016   Husband disable    Sister lives in [place], extended family spread throughout  [place]        Medication List       This list is accurate as of: 08/13/15 11:59 PM.  Always use your most recent med list.               ALPRAZolam 0.5 MG tablet  Commonly known as:  XANAX  Take 1 tablet (0.5 mg total) by mouth 3 (three) times daily as needed for anxiety.     aspirin 81 MG tablet  Take 81 mg by mouth daily.     benazepril 40 MG tablet  Commonly known as:  LOTENSIN  Take 1 tablet (40 mg total) by mouth daily.     CALCIUM + D PO  Take 1 tablet by mouth daily.     cetirizine 10 MG tablet  Commonly known as:  ZYRTEC  Take 10 mg by mouth daily.     colchicine 0.6 MG tablet  Take 0.6 mg by mouth 2 (two) times daily.     ferrous sulfate 325 (65 FE) MG tablet  Take 1 tablet (325 mg total) by mouth 3 (three) times daily after meals.     gabapentin 300 MG capsule  Commonly known as:  NEURONTIN  Take 1 capsule (300 mg total) by mouth at bedtime.     glimepiride 4 MG tablet  Commonly known as:  AMARYL  Take 1 tablet (4 mg total) by mouth daily with breakfast.     glucose blood test strip  Commonly known as:  ONE TOUCH ULTRA TEST  Use as instructed     hydrochlorothiazide 25 MG tablet  Commonly known as:  HYDRODIURIL  Take 1 tablet (25 mg total) by mouth daily.     indomethacin 50 MG capsule  Commonly known as:  INDOCIN  Take 50 mg by mouth every 8 (eight) hours as needed. Reported on 08/13/2015     levothyroxine 150 MCG tablet  Commonly known as:  SYNTHROID, LEVOTHROID  Take 1 tablet (150 mcg total) by mouth daily before breakfast.     metFORMIN 1000 MG tablet  Commonly known as:  GLUCOPHAGE  Take 1 tablet (1,000 mg total) by mouth 2 (two) times  daily with a meal.     metoprolol 50 MG tablet  Commonly known as:  LOPRESSOR  Take 0.5 tablets (25 mg total) by mouth 2 (two) times daily.     pioglitazone 30 MG tablet  Commonly known as:  ACTOS  Take 1 tablet (30 mg total) by mouth daily.     rosuvastatin 10 MG tablet  Commonly known as:  CRESTOR  TAKE 1 TABLET BY MOUTH EVERY DAY     sertraline 50 MG tablet  Commonly known as:  ZOLOFT  Take 3 tablets (150 mg total) by mouth daily.           Objective:   Physical Exam BP 145/49 mmHg  Pulse 46  Temp(Src) 98 F (36.7 C) (Oral)  Ht 5\' 4"  (1.626 m)  Wt 264 lb 3.2 oz (119.84 kg)  BMI 45.33 kg/m2  SpO2 99% General:   Well developed, well nourished . NAD.  HEENT:  Normocephalic . Face symmetric, atraumatic Lungs:  CTA B Normal respiratory effort, no intercostal retractions, no accessory muscle use. Heart: RRR,  no murmur.  No pretibial edema bilaterally  Skin: Not pale. Not jaundice MSK: Feet inspection on palpation: Slightly TTP at the base of the great toes bilaterally, plantar aspect. No redness or swelling Neurologic:  alert & oriented X3.  Speech normal, gait appropriate for age and unassisted Psych--  Cognition and judgment appear intact.  Cooperative with normal attention span and concentration.  Behavior appropriate. No anxious or depressed appearing.      Assessment & Plan:   Assessment >  DM, feet check (-) 11-2014  HTN Hyperlipidemia Hypothyroidism, h/o goiter Depression DJD GOUT-- dx else where ~ 05-2015, sx decreased w/  colchicine ?RLS-- sx decreased w/ gabapentin (2016) Vit D def  Morbid obesity OSA on CPAP Dr Halford Chessman Bradycardia after anesthesia  PLAN: DM: Last A1c 7.7, started Actos. Also taking glimepiride and metformin. Check a A1c, AST, ALT HTN: BP today in the 140s, for now continue Lotensin, HCTZ, metoprolol. Last BMP satisfactory, monitor BPs at home Depression: Few months ago she was feeling slightly depressed, she is now feeling  better, continue same meds Gout: dx w/  gout on clinical grounds, agree--->  likely she had podagra. Dx was d/w pt, check a uric acid. DJD: Also c/o/ pain at the base of the great toes,sx not likely to be gout rather DJD, recommend to see a podiatrist, shoe inserts may help. RTC 4 months, CPX

## 2015-08-14 DIAGNOSIS — M109 Gout, unspecified: Secondary | ICD-10-CM | POA: Insufficient documentation

## 2015-08-14 NOTE — Assessment & Plan Note (Signed)
DM: Last A1c 7.7, started Actos. Also taking glimepiride and metformin. Check a A1c, AST, ALT HTN: BP today in the 140s, for now continue Lotensin, HCTZ, metoprolol. Last BMP satisfactory, monitor BPs at home Depression: Few months ago she was feeling slightly depressed, she is now feeling better, continue same meds Gout: dx w/ gout on clinical grounds, agree--->  likely she had podagra. Dx was d/w pt, check a uric acid. DJD: Also c/o/ pain at the base of the great toes,sx not likely to be gout rather DJD, recommend to see a podiatrist, shoe inserts may help. RTC 4 months, CPX

## 2015-08-17 DIAGNOSIS — R69 Illness, unspecified: Secondary | ICD-10-CM | POA: Diagnosis not present

## 2015-08-19 ENCOUNTER — Other Ambulatory Visit: Payer: Self-pay | Admitting: Internal Medicine

## 2015-08-20 DIAGNOSIS — R200 Anesthesia of skin: Secondary | ICD-10-CM | POA: Diagnosis not present

## 2015-08-20 DIAGNOSIS — G5691 Unspecified mononeuropathy of right upper limb: Secondary | ICD-10-CM | POA: Diagnosis not present

## 2015-08-20 DIAGNOSIS — G8929 Other chronic pain: Secondary | ICD-10-CM | POA: Diagnosis not present

## 2015-08-25 ENCOUNTER — Encounter: Payer: Self-pay | Admitting: Podiatry

## 2015-08-25 ENCOUNTER — Ambulatory Visit (HOSPITAL_BASED_OUTPATIENT_CLINIC_OR_DEPARTMENT_OTHER)
Admission: RE | Admit: 2015-08-25 | Discharge: 2015-08-25 | Disposition: A | Discharge status: Home / Self Care | Payer: Medicare HMO | Source: Ambulatory Visit | Attending: Podiatry | Admitting: Podiatry

## 2015-08-25 ENCOUNTER — Ambulatory Visit (INDEPENDENT_AMBULATORY_CARE_PROVIDER_SITE_OTHER): Payer: Medicare HMO | Admitting: Podiatry

## 2015-08-25 VITALS — BP 147/78 | HR 53 | Resp 18

## 2015-08-25 DIAGNOSIS — R52 Pain, unspecified: Secondary | ICD-10-CM | POA: Diagnosis not present

## 2015-08-25 DIAGNOSIS — M205X9 Other deformities of toe(s) (acquired), unspecified foot: Secondary | ICD-10-CM | POA: Diagnosis not present

## 2015-08-25 DIAGNOSIS — M216X9 Other acquired deformities of unspecified foot: Secondary | ICD-10-CM

## 2015-08-25 DIAGNOSIS — M7732 Calcaneal spur, left foot: Secondary | ICD-10-CM | POA: Insufficient documentation

## 2015-08-25 DIAGNOSIS — M19071 Primary osteoarthritis, right ankle and foot: Secondary | ICD-10-CM | POA: Diagnosis not present

## 2015-08-25 DIAGNOSIS — M19079 Primary osteoarthritis, unspecified ankle and foot: Secondary | ICD-10-CM | POA: Diagnosis not present

## 2015-08-25 NOTE — Progress Notes (Signed)
   Subjective:    Patient ID: PRANISHA CHANNEL, female    DOB: Jun 18, 1950, 65 y.o.   MRN: [id number]  HPI  65 year old female presents the office today for concerns of pain to the ball of her feet on both sides which is been ongoing for quite some time. She has a history of gout her left foot. She has pain in the ball of her foot which is lateral walking and standing. Denies any swelling or redness. No numbness or tingling. She is diabetic and her last A1c was 6.4. No recent injury or trauma. No other complaints. No recent treatment.  Review of Systems  All other systems reviewed and are negative.      Objective:   Physical Exam General: AAO x3, NAD  Dermatological: Skin is warm, dry and supple bilateral. Nails x 10 are well manicured; remaining integument appears unremarkable at this time. There are no open sores, no preulcerative lesions, no rash or signs of infection present.  Vascular: Dorsalis Pedis artery and Posterior Tibial artery pedal pulses are 2/4 bilateral with immedate capillary fill time. Pedal hair growth present. No varicosities and no lower extremity edema present bilateral. There is no pain with calf compression, swelling, warmth, erythema.   Neruologic: Grossly intact via light touch bilateral. Vibratory intact via tuning fork bilateral. Protective threshold with Semmes Wienstein monofilament intact to all pedal sites bilateral. Patellar and Achilles deep tendon reflexes 2+ bilateral. No Babinski or clonus noted bilateral.   Musculoskeletal: Somewhat limitation first MTPJ range of motion dorsiflexion. There is prominence of the first metatarsal head/sesamoids plantarly bilaterally. There is atrophy of the fat pad and the ball the foot. There is mild diffuse tenderness metatarsal heads plantarly bilaterally. There is no edema, erythema. There is no palpable neuroma identified. There is no area pinpoint bony tenderness or pain the vibratory sensation. MMT 5/5. Dorsal medial  midfoot prominence  Gait: Unassisted, Nonantalgic.      Assessment & Plan:  64 year old female with prominent metatarsal heads, plantar flexed metatarsals, osteoarthritis, metatarsalgia -Treatment options discussed including all alternatives, risks, and complications -Etiology of symptoms were discussed -X-rays ordered and reviewed the x-rays with the patient. -I recommended orthotics. She will likely get these next month. [facility] call the office and we can do custom inserts for her. -For now I dispensed metatarsal offloading pads. Also discussed shoe gear modifications. -Follow-up as needed. Call any questions or concerns in the meantime.  Celesta Gentile, DPM

## 2015-08-28 DIAGNOSIS — R94130 Abnormal response to nerve stimulation, unspecified: Secondary | ICD-10-CM

## 2015-08-28 DIAGNOSIS — G5691 Unspecified mononeuropathy of right upper limb: Secondary | ICD-10-CM | POA: Diagnosis not present

## 2015-08-28 HISTORY — DX: Abnormal response to nerve stimulation, unspecified: R94.130

## 2015-09-04 DIAGNOSIS — G8929 Other chronic pain: Secondary | ICD-10-CM | POA: Diagnosis not present

## 2015-09-04 DIAGNOSIS — M79601 Pain in right arm: Secondary | ICD-10-CM | POA: Diagnosis not present

## 2015-09-04 DIAGNOSIS — R200 Anesthesia of skin: Secondary | ICD-10-CM | POA: Diagnosis not present

## 2015-09-04 DIAGNOSIS — G5601 Carpal tunnel syndrome, right upper limb: Secondary | ICD-10-CM | POA: Diagnosis not present

## 2015-10-06 ENCOUNTER — Telehealth: Payer: Self-pay | Admitting: Pulmonary Disease

## 2015-10-06 DIAGNOSIS — G4733 Obstructive sleep apnea (adult) (pediatric): Secondary | ICD-10-CM

## 2015-10-06 NOTE — Telephone Encounter (Signed)
Spoke with the pt  She is wanting a new CPAP machine to be ordered through [facility]  She states current machine is over 66 years old  Will send order to [facility]  Nothing further needed

## 2015-10-08 DIAGNOSIS — G4733 Obstructive sleep apnea (adult) (pediatric): Secondary | ICD-10-CM | POA: Diagnosis not present

## 2015-10-11 ENCOUNTER — Other Ambulatory Visit: Payer: Self-pay | Admitting: Internal Medicine

## 2015-10-13 DIAGNOSIS — G4733 Obstructive sleep apnea (adult) (pediatric): Secondary | ICD-10-CM | POA: Diagnosis not present

## 2015-10-29 ENCOUNTER — Other Ambulatory Visit: Payer: Self-pay | Admitting: Internal Medicine

## 2015-11-13 DIAGNOSIS — G4733 Obstructive sleep apnea (adult) (pediatric): Secondary | ICD-10-CM | POA: Diagnosis not present

## 2015-11-25 ENCOUNTER — Other Ambulatory Visit: Payer: Self-pay | Admitting: Internal Medicine

## 2015-11-30 DIAGNOSIS — G5601 Carpal tunnel syndrome, right upper limb: Secondary | ICD-10-CM | POA: Diagnosis not present

## 2015-12-13 DIAGNOSIS — G4733 Obstructive sleep apnea (adult) (pediatric): Secondary | ICD-10-CM | POA: Diagnosis not present

## 2015-12-17 ENCOUNTER — Encounter: Payer: Medicare HMO | Admitting: Internal Medicine

## 2015-12-20 ENCOUNTER — Encounter: Payer: Self-pay | Admitting: Pulmonary Disease

## 2015-12-20 ENCOUNTER — Ambulatory Visit (INDEPENDENT_AMBULATORY_CARE_PROVIDER_SITE_OTHER): Payer: Medicare HMO | Admitting: Pulmonary Disease

## 2015-12-20 VITALS — BP 128/70 | HR 56 | Ht 65.0 in | Wt 276.0 lb

## 2015-12-20 DIAGNOSIS — Z6841 Body Mass Index (BMI) 40.0 and over, adult: Secondary | ICD-10-CM

## 2015-12-20 DIAGNOSIS — Z9989 Dependence on other enabling machines and devices: Secondary | ICD-10-CM

## 2015-12-20 DIAGNOSIS — Z23 Encounter for immunization: Secondary | ICD-10-CM | POA: Diagnosis not present

## 2015-12-20 DIAGNOSIS — G4733 Obstructive sleep apnea (adult) (pediatric): Secondary | ICD-10-CM | POA: Diagnosis not present

## 2015-12-20 NOTE — Patient Instructions (Signed)
Flu shot today Follow up in 1 year 

## 2015-12-20 NOTE — Progress Notes (Signed)
Current Outpatient Prescriptions on File Prior to Visit  Medication Sig  . ALPRAZolam (XANAX) 0.5 MG tablet Take 1 tablet (0.5 mg total) by mouth 3 (three) times daily as needed for anxiety.  Marland Kitchen aspirin 81 MG tablet Take 81 mg by mouth daily.  . benazepril (LOTENSIN) 40 MG tablet Take 1 tablet (40 mg total) by mouth daily.  . Calcium Carbonate-Vitamin D (CALCIUM + D PO) Take 1 tablet by mouth daily.   . cetirizine (ZYRTEC) 10 MG tablet Take 10 mg by mouth daily.  . colchicine 0.6 MG tablet Take 0.6 mg by mouth 2 (two) times daily as needed.   . ferrous sulfate 325 (65 FE) MG tablet Take 1 tablet (325 mg total) by mouth 3 (three) times daily after meals.  . gabapentin (NEURONTIN) 300 MG capsule Take 1 capsule (300 mg total) by mouth at bedtime.  Marland Kitchen glimepiride (AMARYL) 4 MG tablet Take 1 tablet (4 mg total) by mouth daily with breakfast.  . glucose blood (ONE TOUCH ULTRA TEST) test strip Use as instructed  . hydrochlorothiazide (HYDRODIURIL) 25 MG tablet Take 1 tablet (25 mg total) by mouth daily.  . indomethacin (INDOCIN) 50 MG capsule Take 50 mg by mouth every 8 (eight) hours as needed. Reported on 08/13/2015  . levothyroxine (SYNTHROID, LEVOTHROID) 150 MCG tablet Take 1 tablet (150 mcg total) by mouth daily before breakfast.  . metFORMIN (GLUCOPHAGE) 1000 MG tablet Take 1 tablet (1,000 mg total) by mouth 2 (two) times daily with a meal.  . metoprolol (LOPRESSOR) 50 MG tablet Take 0.5 tablets (25 mg total) by mouth 2 (two) times daily.  . pioglitazone (ACTOS) 30 MG tablet Take 1 tablet (30 mg total) by mouth daily.  . rosuvastatin (CRESTOR) 10 MG tablet TAKE 1 TABLET BY MOUTH EVERY DAY  . sertraline (ZOLOFT) 50 MG tablet Take 3 tablets (150 mg total) by mouth daily.   No current facility-administered medications on file prior to visit.     Chief Complaint  Patient presents with  . Follow-up    Pt states that she got a new machine x 3 months ago and it seems to be working fine. Denies  problems with mask/pressure. DME: [employer]. ; Wants flu shot today if able.     Sleep tests PSG 03/26/01 >> AHI 55 CPAP 11/17/15 to 12/16/15 >> used on 29 of 30 nights with average 7 hrs 30 min.  Average AHI 1.3 with CPAP 13 cm H2O  Past medical history Hypothyroidism, HLD, HTN, OA, DM, Depression, Allergies  Past surgical hx, Allergies, Family hx, Social hx all reviewed.  Vital signs BP 128/70 (BP Location: Left Arm, Cuff Size: Normal)   Pulse (!) 56   Ht 5\' 5"  (1.651 m)   Wt 276 lb (125.2 kg)   SpO2 98%   BMI 45.93 kg/m   History of Present Illness: [name] is a 65 y.o. female with OSA.  She is doing well with CPAP.  Got new machine and works better.  Has nasal mask.  Occasional mouth dryness.  Stressed from dealing with husbands health issues.  Physical Exam:  General - No distress ENT - No sinus tenderness, no oral exudate, no LAN, MP 3 Cardiac - s1s2 regular, no murmur Chest - No wheeze/rales/dullness Back - No focal tenderness Abd - Soft, non-tender Ext - No edema Neuro - Normal strength Skin - No rashes Psych - normal mood, and behavior   Assessment/Plan:  Obstructive sleep apnea. - She is compliant with CPAP and reports  benefit - continue CPAP 13 cm   Obesity. - reviewed options to assist with weight loss   Patient Instructions  Flu shot today  Follow up in 1 year   Chesley Mires, MD [facility] Pager:  [phone] 12/20/2015, 3:41 PM

## 2015-12-24 ENCOUNTER — Other Ambulatory Visit: Payer: Self-pay | Admitting: Internal Medicine

## 2015-12-26 ENCOUNTER — Other Ambulatory Visit: Payer: Self-pay | Admitting: Internal Medicine

## 2016-01-13 DIAGNOSIS — G4733 Obstructive sleep apnea (adult) (pediatric): Secondary | ICD-10-CM | POA: Diagnosis not present

## 2016-01-26 DIAGNOSIS — G5601 Carpal tunnel syndrome, right upper limb: Secondary | ICD-10-CM | POA: Diagnosis not present

## 2016-01-28 HISTORY — PX: CARPAL TUNNEL RELEASE: SHX101

## 2016-02-03 ENCOUNTER — Other Ambulatory Visit: Payer: Self-pay | Admitting: Internal Medicine

## 2016-02-08 DIAGNOSIS — Z4789 Encounter for other orthopedic aftercare: Secondary | ICD-10-CM | POA: Diagnosis not present

## 2016-02-12 DIAGNOSIS — G4733 Obstructive sleep apnea (adult) (pediatric): Secondary | ICD-10-CM | POA: Diagnosis not present

## 2016-02-27 DIAGNOSIS — R0602 Shortness of breath: Secondary | ICD-10-CM | POA: Diagnosis not present

## 2016-02-27 DIAGNOSIS — R05 Cough: Secondary | ICD-10-CM | POA: Diagnosis not present

## 2016-02-27 DIAGNOSIS — R069 Unspecified abnormalities of breathing: Secondary | ICD-10-CM | POA: Diagnosis not present

## 2016-02-27 DIAGNOSIS — J189 Pneumonia, unspecified organism: Secondary | ICD-10-CM | POA: Diagnosis not present

## 2016-02-27 LAB — HEPATIC FUNCTION PANEL
ALT: 42 U/L — AB (ref 7–35)
AST: 30 U/L (ref 13–35)
Alkaline Phosphatase: 162 U/L — AB (ref 25–125)
Bilirubin, Total: 0.6 mg/dL

## 2016-02-27 LAB — BASIC METABOLIC PANEL
BUN: 29 mg/dL — AB (ref 4–21)
CREATININE: 1.2 mg/dL — AB (ref 0.5–1.1)
GLUCOSE: 167 mg/dL
POTASSIUM: 5.1 mmol/L (ref 3.4–5.3)
Sodium: 143 mmol/L (ref 137–147)

## 2016-02-27 LAB — CBC AND DIFFERENTIAL
HCT: 37 % (ref 36–46)
Hemoglobin: 12.2 g/dL (ref 12.0–16.0)
Neutrophils Absolute: 10 /uL
Platelets: 293 10*3/uL (ref 150–399)
WBC: 13.8 10*3/mL

## 2016-02-29 ENCOUNTER — Telehealth: Payer: Self-pay

## 2016-02-29 ENCOUNTER — Telehealth: Payer: Self-pay | Admitting: Internal Medicine

## 2016-02-29 ENCOUNTER — Ambulatory Visit (INDEPENDENT_AMBULATORY_CARE_PROVIDER_SITE_OTHER): Payer: Medicare HMO | Admitting: Internal Medicine

## 2016-02-29 ENCOUNTER — Encounter: Payer: Self-pay | Admitting: Internal Medicine

## 2016-02-29 VITALS — BP 132/78 | HR 49 | Temp 98.4°F | Resp 14 | Ht 65.0 in | Wt 256.1 lb

## 2016-02-29 DIAGNOSIS — Z0000 Encounter for general adult medical examination without abnormal findings: Secondary | ICD-10-CM

## 2016-02-29 DIAGNOSIS — E559 Vitamin D deficiency, unspecified: Secondary | ICD-10-CM

## 2016-02-29 DIAGNOSIS — E034 Atrophy of thyroid (acquired): Secondary | ICD-10-CM | POA: Diagnosis not present

## 2016-02-29 DIAGNOSIS — E119 Type 2 diabetes mellitus without complications: Secondary | ICD-10-CM | POA: Diagnosis not present

## 2016-02-29 DIAGNOSIS — I10 Essential (primary) hypertension: Secondary | ICD-10-CM

## 2016-02-29 DIAGNOSIS — J189 Pneumonia, unspecified organism: Secondary | ICD-10-CM

## 2016-02-29 DIAGNOSIS — E7800 Pure hypercholesterolemia, unspecified: Secondary | ICD-10-CM

## 2016-02-29 MED ORDER — ALPRAZOLAM 0.5 MG PO TABS: 0.5000 mg | ORAL_TABLET | Freq: Three times a day (TID) | ORAL | 1 refills | Status: DC | PRN

## 2016-02-29 NOTE — Telephone Encounter (Signed)
error 

## 2016-02-29 NOTE — Telephone Encounter (Signed)
Received fax confirmation 02/29/2016 at 1551.

## 2016-02-29 NOTE — Assessment & Plan Note (Signed)
Pneumonia: Get records from the hospital, continue abx, codeine. Depending on the x-ray report, may need to follow-up chest x-ray. Bronchospasm: No h/o asthma but she was prescribed steroids and albuteroland the past and again recently. Likely has  RAD, cont  with the same meds DM: Continue Actos, metformin, amaryl, check A1c. Currently on prednisone, recommend to check CBGs, call if they are more than 200. HTN: Continue metoprolol, HCTZ, Lotensin. Checking labs. Mildly bradycardic. Hyperlipidemia: Continue Crestor, checking labs Depression, anxiety: Continue sertraline 150 mg and Xanax as needed Hypothyroidism: On Synthroid, checking a TSH Vitamin D deficiency: Check labs RTC 4 months

## 2016-02-29 NOTE — Progress Notes (Signed)
Subjective:    Patient ID: [name], female    DOB: [birth year], 66 y.o.   MRN: [id number]  DOS:  02/29/2016 Type of visit - description : cpx Interval history: Here for a CPX, we also discussed an acute issue and managed chronic problems  The patient got sick approximately 2 weeks ago with on and off head and chest congestion, cough with greenish sputum, never had fever or chills. She was feeling progressively worse, went to the ER in [facility]. Had x-rays and labs. DX with PNM, rx Levaquin, albuterol, prednisone, Codein. Overall feels slightly better   review of Systems Constitutional: No fever. No chills. No unexplained wt changes. No unusual sweats  HEENT: No dental problems, had some ear discomfort at the time of the PNM DX, no facial swelling, no voice changes. No eye discharge, no eye  redness , no  intolerance to light   Respiratory: Had some wheezing at the time of pneumonia, no history of asthma, former smoker.  Cardiovascular: No CP, no leg swelling , no  Palpitations  GI: no nausea, no vomiting, no diarrhea , no  abdominal pain.  No blood in the stools. No dysphagia, no odynophagia    Endocrine: No polyphagia, no polyuria , no polydipsia  GU: No dysuria, gross hematuria, difficulty urinating. No urinary urgency, no frequency.  Musculoskeletal: Occasional leg cramps  Skin: No change in the color of the skin, palor , no  Rash  Allergic, immunologic: No environmental allergies , no  food allergies  Neurological: No dizziness no  syncope. No headaches. No diplopia, no slurred, no slurred speech, no motor deficits, no facial  Numbness  Hematological: No enlarged lymph nodes, no easy bruising , no unusual bleedings  Psychiatry: No suicidal ideas, no hallucinations, no beavior problems, no confusion.  No unusual/severe anxiety, no depression   Past Medical History:  Diagnosis Date  . Abnormal nerve conduction studies 08/2015   moderate R carpal  tunnel syndrome   . Bradycardia   . Complication of anesthesia    HR dropped to 20  . Depression   . Diabetes mellitus    has diarrhea from medicine  . Dyslipidemia   . Goiter    h/o  . Hypertension   . Hypothyroidism    w/ h/o goiter  . Morbid obesity ([place])   . Osteoarthritis   . PONV (postoperative nausea and vomiting)   . Seasonal allergies   . Sleep apnea    on CPAP    Past Surgical History:  Procedure Laterality Date  . APPENDECTOMY  1970's  . COLONOSCOPY    . KNEE ARTHROSCOPY W/ MENISCECTOMY Right 2007  . TOTAL KNEE ARTHROPLASTY  08/05/07   right  . TOTAL KNEE ARTHROPLASTY Left 07/01/2013   Procedure: LEFT TOTAL KNEE ARTHROPLASTY;  Surgeon: Mauri Pole, MD;  Location: [facility];  Service: Orthopedics;  Laterality: Left;    Social History   Social History  . Marital status: Married    Spouse name: N/A  . Number of children: 1  . Years of education: N/A   Occupational History  . retired 01-2014    Social History Main Topics  . Smoking status: Former Smoker    Packs/day: 0.50    Years: 10.00    Types: Cigarettes    Quit date: 02/28/1980  . Smokeless tobacco: Never Used  . Alcohol use Yes     Comment: occasionally  . Drug use: No  . Sexual activity: Not on file  Other Topics Concern  . Not on file   Social History Narrative   1 child: lost child 01/2010     Lost brother ~ 65   Lost parents ~ 2016   Husband disable    Sister lives in [place], extended family spread throughout  [place]     Family History  Problem Relation Age of Onset  . Stomach cancer Brother   . Esophageal cancer Brother   . Coronary artery disease Father     F MI age 53, 9 at age 51  . Hypertension Father   . Diabetes Mother     M , brother amd sister  . Lung cancer Mother     around the lungs  . CAD Brother     died age 84, h/o DM-Tobacco-PVD  . Colon cancer Neg Hx   . Breast cancer Neg Hx   . Rectal cancer Neg Hx      Allergies as of 02/29/2016   No Known Allergies      Medication List       Accurate as of 02/29/16  3:08 PM. Always use your most recent med list.          ALPRAZolam 0.5 MG tablet Commonly known as:  XANAX Take 1 tablet (0.5 mg total) by mouth 3 (three) times daily as needed for anxiety.   aspirin 81 MG tablet Take 81 mg by mouth daily.   benazepril 40 MG tablet Commonly known as:  LOTENSIN Take 1 tablet (40 mg total) by mouth daily.   CALCIUM + D PO Take 1 tablet by mouth daily.   cetirizine 10 MG tablet Commonly known as:  ZYRTEC Take 10 mg by mouth daily.   colchicine 0.6 MG tablet Take 0.6 mg by mouth 2 (two) times daily as needed.   ferrous sulfate 325 (65 FE) MG tablet Take 1 tablet (325 mg total) by mouth 3 (three) times daily after meals.   gabapentin 300 MG capsule Commonly known as:  NEURONTIN Take 1 capsule (300 mg total) by mouth at bedtime.   glimepiride 4 MG tablet Commonly known as:  AMARYL Take 1 tablet (4 mg total) by mouth daily with breakfast.   glucose blood test strip Commonly known as:  ONE TOUCH ULTRA TEST Use as instructed   hydrochlorothiazide 25 MG tablet Commonly known as:  HYDRODIURIL Take 1 tablet (25 mg total) by mouth daily.   indomethacin 50 MG capsule Commonly known as:  INDOCIN Take 50 mg by mouth every 8 (eight) hours as needed. Reported on 08/13/2015   levothyroxine 150 MCG tablet Commonly known as:  SYNTHROID, LEVOTHROID Take 1 tablet (150 mcg total) by mouth daily before breakfast.   metFORMIN 1000 MG tablet Commonly known as:  GLUCOPHAGE Take 1 tablet (1,000 mg total) by mouth 2 (two) times daily with a meal.   metoprolol 50 MG tablet Commonly known as:  LOPRESSOR Take 0.5 tablets (25 mg total) by mouth 2 (two) times daily.   pioglitazone 30 MG tablet Commonly known as:  ACTOS Take 1 tablet (30 mg total) by mouth daily.   rosuvastatin 10 MG tablet Commonly known as:  CRESTOR Take 1 tablet (10 mg total) by mouth daily.   sertraline 50 MG tablet Commonly  known as:  ZOLOFT Take 3 tablets (150 mg total) by mouth daily.          Objective:   Physical Exam BP 132/78 (BP Location: Left Arm, Patient Position: Sitting, Cuff Size: Normal)   Pulse Marland Kitchen)  49   Temp 98.4 F (36.9 C) (Oral)   Resp 14   Ht 5\' 5"  (1.651 m)   Wt 256 lb 2 oz (116.2 kg)   SpO2 95%   BMI 42.62 kg/m   General:   Well developed, well nourished . Frequent cough noted, no respiratory distress. Nontoxic but she looks very tired HEENT:  Normocephalic . Face symmetric, atraumatic. TMs normal. Nose congested, sinuses no TTP. Throat symmetric, no red. Lungs:  Few rhonchi with cough Normal respiratory effort, no intercostal retractions, no accessory muscle use. Heart: RRR,  no murmur.  No pretibial edema bilaterally  Abdomen:  Not distended, soft, non-tender. No rebound or rigidity.   Skin: Exposed areas without rash. Not pale. Not jaundice Neurologic:  alert & oriented X3.  Speech normal, gait appropriate for age and unassisted Strength symmetric and appropriate for age.  Psych: Cognition and judgment appear intact.  Cooperative with normal attention span and concentration.  Behavior appropriate. No anxious or depressed appearing.    Assessment & Plan:   Assessment   DM   HTN Hyperlipidemia Hypothyroidism, h/o goiter Depression- anxiety H/o Bronchospasm DJD GOUT-- dx else where ~ 05-2015, sx decreased w/  colchicine ?RLS-- sx decreased w/ gabapentin (2016) Vit D def  Morbid obesity OSA on CPAP Dr Halford Chessman Bradycardia after anesthesia  PLAN: Pneumonia: Get records from the hospital, continue abx, codeine. Depending on the x-ray report, may need to follow-up chest x-ray. Bronchospasm: No h/o asthma but she was prescribed steroids and albuteroland the past and again recently. Likely has  RAD, cont  with the same meds DM: Continue Actos, metformin, amaryl, check A1c. Currently on prednisone, recommend to check CBGs, call if they are more than 200. HTN:  Continue metoprolol, HCTZ, Lotensin. Checking labs. Mildly bradycardic. Hyperlipidemia: Continue Crestor, checking labs Depression, anxiety: Continue sertraline 150 mg and Xanax as needed Hypothyroidism: On Synthroid, checking a TSH Vitamin D deficiency: Check labs RTC 4 months   Today,In addition to her physical exam, I  spent more than 20   min with the patient: >50% of the time counseling regards chronic medical problems, getting records from a recent ER visit, advising her about pneumonia, an acute issue.

## 2016-02-29 NOTE — Progress Notes (Signed)
Pre visit review using our clinic review tool, if applicable. No additional management support is needed unless otherwise documented below in the visit note. 

## 2016-02-29 NOTE — Assessment & Plan Note (Addendum)
Td 2012;  Pneumonia shot-- 2011;  prevnar 07-2014 Shingles shot 2013 ; had a flu shot this season   Cscope 2004: melanosis coli, no polyp----> Cscope again 7- 2014 (Dr Carlean Purl), normal next 66 years Female care-- last visit 2015, rec to call them  Abnormal MMG 2015, pt reports f/u XR ok (no report): encouraged to set up a f/u DEXA normal, 12/2008 and 07-2014 on calcium and vitamin D Diet and exercise discussed Check labs, see orders

## 2016-02-29 NOTE — Patient Instructions (Addendum)
   GO TO THE FRONT DESK Schedule your next appointment for a check up in 4 months    Schedule labs to be done in 2 weeks   Schedule a Medicare wellness with one of our registered nurses    Check the  blood pressure and hear rate  daily Be sure your blood pressure is between 110/65 and  145/85. Your pulse should not be less than 50 If  consistently higher or lower, let me know   Continue taking the medications for pneumonia as prescribed. If you are not gradually improving or if you get worse: Please let me know  Check your blood sugars daily, stop prednisone and let me know if your blood sugars are > 200

## 2016-02-29 NOTE — Telephone Encounter (Addendum)
ROI completed and faxed to [facility] at [phone]. ROI sent for scanning. Awaiting records.

## 2016-03-03 ENCOUNTER — Other Ambulatory Visit: Payer: Self-pay | Admitting: Internal Medicine

## 2016-03-03 NOTE — Telephone Encounter (Signed)
Received fax confirmation 03/03/[phone].

## 2016-03-03 NOTE — Telephone Encounter (Signed)
Received medical records from [facility]. Forwarded to PCP.

## 2016-03-03 NOTE — Telephone Encounter (Signed)
Patient called to follow up on request for refill. Plse adv °

## 2016-03-03 NOTE — Telephone Encounter (Signed)
2nd request for records faxed.

## 2016-03-03 NOTE — Telephone Encounter (Signed)
Rx sent, Pt due for CPE labs from earlier this week.

## 2016-03-03 NOTE — Telephone Encounter (Signed)
ER records reviewed: CBC showed a white count of 13.8. Creatinine was 1.2, blood sugar 167, chloride 108, slightly high. Chest x-ray: Patchy bronchopneumonia bilateral lower lobes EKG: Sinus bradycardia Advise patient:  Records reviewed, proceed with labs in 2 weeks as recommended. Also enter a chest x-ray --DX PNM -- to be done at the time of labs

## 2016-03-06 NOTE — Telephone Encounter (Signed)
LMOM informing Pt of recommendations. Instructed to call to schedule lab appt.

## 2016-03-07 ENCOUNTER — Telehealth: Payer: Self-pay

## 2016-03-07 ENCOUNTER — Telehealth: Payer: Self-pay | Admitting: Internal Medicine

## 2016-03-07 MED ORDER — HYDROCODONE-HOMATROPINE 5-1.5 MG/5ML PO SYRP: 5.0000 mL | ORAL_SOLUTION | Freq: Two times a day (BID) | ORAL | 0 refills | Status: DC | PRN

## 2016-03-07 MED ORDER — BENZONATATE 200 MG PO CAPS: 200.0000 mg | ORAL_CAPSULE | Freq: Three times a day (TID) | ORAL | 0 refills | Status: DC | PRN

## 2016-03-07 MED ORDER — ONDANSETRON HCL 4 MG PO TABS: 4.0000 mg | ORAL_TABLET | Freq: Three times a day (TID) | ORAL | 0 refills | Status: DC | PRN

## 2016-03-07 NOTE — Telephone Encounter (Signed)
Please advise 

## 2016-03-07 NOTE — Telephone Encounter (Signed)
Okay, take Mucinex DM OTC, if the cough persist, take hydrocodone (prescription printed) Also I sent Zofran as needed for nausea. Call if not gradually improving or if symptoms severe.

## 2016-03-07 NOTE — Telephone Encounter (Signed)
PA initiated via Covermymeds; KEY: [id number]. Awaiting determination.

## 2016-03-07 NOTE — Telephone Encounter (Signed)
Spoke w/ Pt, informed her of recommendations, she is requesting cough medication that can be sent to pharmacy since she lives far away. Per PCP okay to send Tessalon 200mg  1 capsule TID PRN. Pt also having popping in ears, instructed to try OTC Flonase PRN and to let us know if not improving. Pt verbalized understanding.

## 2016-03-07 NOTE — Telephone Encounter (Signed)
°  Relation to [id number] Call back number:[phone]   Reason for call:   Patient was seen 02/29/16 as per AVS: Continue taking the medications for pneumonia as prescribed. If you are not gradually improving or if you get worse: Please let me know, patient states she's experiencing [place] and coughing requesting Rx, please advise

## 2016-03-08 NOTE — Telephone Encounter (Signed)
Received PA approval notification letter. PA approval effective through 02/26/2017. PA notification sent for scanning.

## 2016-03-08 NOTE — Telephone Encounter (Signed)
Spoke w/ [employer], notified them of PA approval.

## 2016-03-14 DIAGNOSIS — G4733 Obstructive sleep apnea (adult) (pediatric): Secondary | ICD-10-CM | POA: Diagnosis not present

## 2016-03-22 NOTE — Telephone Encounter (Signed)
Left message on patients voicemail to follow up on labs that were ordered at [facility]. Informed patient to call the office to schedule appointment for labs.

## 2016-03-26 ENCOUNTER — Telehealth: Payer: Self-pay | Admitting: Internal Medicine

## 2016-03-26 NOTE — Telephone Encounter (Signed)
She is due for multiple labs and a chest x-ray, please arrange

## 2016-03-27 NOTE — Telephone Encounter (Signed)
Letter printed and mailed to Pt.  

## 2016-04-06 ENCOUNTER — Other Ambulatory Visit: Payer: Self-pay | Admitting: Internal Medicine

## 2016-04-12 ENCOUNTER — Other Ambulatory Visit (INDEPENDENT_AMBULATORY_CARE_PROVIDER_SITE_OTHER): Payer: Medicare HMO

## 2016-04-12 ENCOUNTER — Ambulatory Visit (HOSPITAL_BASED_OUTPATIENT_CLINIC_OR_DEPARTMENT_OTHER)
Admission: RE | Admit: 2016-04-12 | Discharge: 2016-04-12 | Disposition: A | Discharge status: Home / Self Care | Payer: Medicare HMO | Source: Ambulatory Visit | Attending: Internal Medicine | Admitting: Internal Medicine

## 2016-04-12 DIAGNOSIS — E034 Atrophy of thyroid (acquired): Secondary | ICD-10-CM | POA: Diagnosis not present

## 2016-04-12 DIAGNOSIS — E559 Vitamin D deficiency, unspecified: Secondary | ICD-10-CM | POA: Diagnosis not present

## 2016-04-12 DIAGNOSIS — J189 Pneumonia, unspecified organism: Secondary | ICD-10-CM | POA: Diagnosis not present

## 2016-04-12 DIAGNOSIS — Z0000 Encounter for general adult medical examination without abnormal findings: Secondary | ICD-10-CM | POA: Diagnosis not present

## 2016-04-12 DIAGNOSIS — E119 Type 2 diabetes mellitus without complications: Secondary | ICD-10-CM | POA: Diagnosis not present

## 2016-04-12 LAB — CBC WITH DIFFERENTIAL/PLATELET
BASOS PCT: 0.7 % (ref 0.0–3.0)
Basophils Absolute: 0 10*3/uL (ref 0.0–0.1)
EOS ABS: 0.4 10*3/uL (ref 0.0–0.7)
EOS PCT: 6.2 % — AB (ref 0.0–5.0)
HEMATOCRIT: 36 % (ref 36.0–46.0)
HEMOGLOBIN: 11.6 g/dL — AB (ref 12.0–15.0)
LYMPHS PCT: 26.4 % (ref 12.0–46.0)
Lymphs Abs: 1.6 10*3/uL (ref 0.7–4.0)
MCHC: 32.1 g/dL (ref 30.0–36.0)
MCV: 89.4 fl (ref 78.0–100.0)
Monocytes Absolute: 0.5 10*3/uL (ref 0.1–1.0)
Monocytes Relative: 8.1 % (ref 3.0–12.0)
NEUTROS ABS: 3.5 10*3/uL (ref 1.4–7.7)
Neutrophils Relative %: 58.6 % (ref 43.0–77.0)
PLATELETS: 303 10*3/uL (ref 150.0–400.0)
RBC: 4.02 Mil/uL (ref 3.87–5.11)
RDW: 16.6 % — AB (ref 11.5–15.5)
WBC: 5.9 10*3/uL (ref 4.0–10.5)

## 2016-04-12 LAB — COMPREHENSIVE METABOLIC PANEL
ALBUMIN: 3.5 g/dL (ref 3.5–5.2)
ALT: 11 U/L (ref 0–35)
AST: 15 U/L (ref 0–37)
Alkaline Phosphatase: 80 U/L (ref 39–117)
BILIRUBIN TOTAL: 0.3 mg/dL (ref 0.2–1.2)
BUN: 27 mg/dL — AB (ref 6–23)
CO2: 26 mEq/L (ref 19–32)
Calcium: 9.1 mg/dL (ref 8.4–10.5)
Chloride: 108 mEq/L (ref 96–112)
Creatinine, Ser: 1.07 mg/dL (ref 0.40–1.20)
GFR: 54.4 mL/min — AB (ref >60.00)
GLUCOSE: 93 mg/dL (ref 70–99)
POTASSIUM: 4.7 meq/L (ref 3.5–5.1)
SODIUM: 139 meq/L (ref 135–145)
TOTAL PROTEIN: 6.5 g/dL (ref 6.0–8.3)

## 2016-04-12 LAB — LIPID PANEL
CHOLESTEROL: 142 mg/dL (ref 0–200)
HDL: 44.7 mg/dL (ref >39.00)
NonHDL: 96.86
Total CHOL/HDL Ratio: 3
Triglycerides: 255 mg/dL — ABNORMAL HIGH (ref 0.0–149.0)
VLDL: 51 mg/dL — AB (ref 0.0–40.0)

## 2016-04-12 LAB — TSH: TSH: 1.78 u[IU]/mL (ref 0.35–4.50)

## 2016-04-12 LAB — LDL CHOLESTEROL, DIRECT: LDL DIRECT: 78 mg/dL

## 2016-04-12 LAB — HEMOGLOBIN A1C: Hgb A1c MFr Bld: 6.9 % — ABNORMAL HIGH (ref 4.6–6.5)

## 2016-04-14 DIAGNOSIS — G4733 Obstructive sleep apnea (adult) (pediatric): Secondary | ICD-10-CM | POA: Diagnosis not present

## 2016-04-15 LAB — VITAMIN D 1,25 DIHYDROXY
Vitamin D 1, 25 (OH)2 Total: 13 pg/mL — ABNORMAL LOW (ref 18–72)
Vitamin D3 1, 25 (OH)2: 13 pg/mL

## 2016-04-18 MED ORDER — VITAMIN D (ERGOCALCIFEROL) 1.25 MG (50000 UNIT) PO CAPS: 50000.0000 [IU] | ORAL_CAPSULE | ORAL | 3 refills | Status: DC

## 2016-05-12 DIAGNOSIS — G4733 Obstructive sleep apnea (adult) (pediatric): Secondary | ICD-10-CM | POA: Diagnosis not present

## 2016-05-27 ENCOUNTER — Other Ambulatory Visit: Payer: Self-pay | Admitting: Internal Medicine

## 2016-05-29 ENCOUNTER — Encounter: Payer: Self-pay | Admitting: Family Medicine

## 2016-05-29 ENCOUNTER — Ambulatory Visit (INDEPENDENT_AMBULATORY_CARE_PROVIDER_SITE_OTHER): Payer: Medicare HMO | Admitting: Family Medicine

## 2016-05-29 VITALS — BP 130/58 | HR 66 | Temp 97.6°F | Ht 65.0 in | Wt 260.4 lb

## 2016-05-29 DIAGNOSIS — B86 Scabies: Secondary | ICD-10-CM

## 2016-05-29 MED ORDER — PERMETHRIN 5 % EX CREA: TOPICAL_CREAM | CUTANEOUS | 1 refills | Status: DC

## 2016-05-29 NOTE — Patient Instructions (Addendum)
Scabies, Adult Scabies is a skin condition that happens when very small insects get under the skin (infestation). This causes a rash and severe itchiness. Scabies can spread from person to person (is contagious). If you get scabies, it is common for others in your household to get scabies too. With proper treatment, symptoms usually go away in 2-4 weeks. Scabies usually does not cause lasting problems. What are the causes? This condition is caused by mites (Sarcoptes scabiei, or human itch mites) that can only be seen with a microscope. The mites get into the top layer of skin and lay eggs. Scabies can spread from person to person through:  Close contact with a person who has scabies.  Contact with infested items, such as towels, bedding, or clothing. What increases the risk? This condition is more likely to develop in:  People who live in nursing homes and other extended-care facilities.  People who have sexual contact with a partner who has scabies.  Young children who attend child care facilities.  People who care for others who are at increased risk for scabies. What are the signs or symptoms? Symptoms of this condition may include:  Severe itchiness. This is often worse at night.  A rash that includes tiny red bumps or blisters. The rash commonly occurs on the wrist, elbow, armpit, fingers, waist, groin, or buttocks. Bumps may form a line (burrow) in some areas.  Skin irritation. This can include scaly patches or sores. How is this diagnosed? This condition is diagnosed with a physical exam. Your health care provider will look closely at your skin. In some cases, your health care provider may take a sample of your affected skin (skin scraping) and have it examined under a microscope. How is this treated? This condition may be treated with:  Medicated cream or lotion that kills the mites. This is spread on the entire body and left on for several hours. Usually, one treatment with  medicated cream or lotion is enough to kill all of the mites. In severe cases, the treatment may be repeated.  Medicated cream that relieves itching.  Medicines that help to relieve itching.  Medicines that kill the mites. This treatment is rarely used. Follow these instructions at home:   Medicines   Take or apply over-the-counter and prescription medicines as told by your health care provider.  Apply medicated cream or lotion as told by your health care provider.  Do not wash off the medicated cream or lotion until the necessary amount of time has passed. Skin Care   Avoid scratching your affected skin.  Keep your fingernails closely trimmed to reduce injury from scratching.  Take cool baths or apply cool washcloths to help reduce itching. General instructions   Clean all items that you recently had contact with, including bedding, clothing, and furniture. Do this on the same day that your treatment starts.  Use hot water when you wash items.  Place unwashable items into closed, airtight plastic bags for at least 3 days. The mites cannot live for more than 3 days away from human skin.  Vacuum furniture and mattresses that you use.  Make sure that other people who may have been infested are examined by a health care provider. These include members of your household and anyone who may have had contact with infested items.  Keep all follow-up visits as told by your health care provider. This is important. Contact a health care provider if:  You have itching that does not go away   after 4 weeks of treatment.  You continue to develop new bumps or burrows.  You have redness, swelling, or pain in your rash area after treatment.  You have fluid, blood, or pus coming from your rash. This information is not intended to replace advice given to you by your health care provider. Make sure you discuss any questions you have with your health care provider. Document Released:  11/04/2014 Document Revised: 07/22/2015 Document Reviewed: 09/15/2014 Elsevier Interactive Patient Education  2017 Elsevier Inc.  

## 2016-05-29 NOTE — Progress Notes (Signed)
Chief Complaint  Patient presents with  . Rash    all over-started 1 month ago-itching    Subjective: Patient is a 66 y.o. female here for a rash.  The patient has had an itchy rash over her hands, feet, extremities, and torso over the past month. Around 1 month ago, her husband was hospitalized at [facility]. He does not hurt. There is no drainage from any of the lesions. She denies fevers. She is unsure if her husband has similar lesions as he was admitted for cellulitis.   ROS: Skin: As noted in HPI  Family History  Problem Relation Age of Onset  . Stomach cancer Brother   . Esophageal cancer Brother   . Coronary artery disease Father     F MI age 59, 56 at age 57  . Hypertension Father   . Diabetes Mother     M , brother amd sister  . Lung cancer Mother     around the lungs  . CAD Brother     died age 41, h/o DM-Tobacco-PVD  . Colon cancer Neg Hx   . Breast cancer Neg Hx   . Rectal cancer Neg Hx    Past Medical History:  Diagnosis Date  . Abnormal nerve conduction studies 08/2015   moderate R carpal tunnel syndrome   . Bradycardia   . Complication of anesthesia    HR dropped to 20  . Depression   . Diabetes mellitus    has diarrhea from medicine  . Dyslipidemia   . Goiter    h/o  . Hypertension   . Hypothyroidism    w/ h/o goiter  . Morbid obesity ([place])   . Osteoarthritis   . PONV (postoperative nausea and vomiting)   . Seasonal allergies   . Sleep apnea    on CPAP   No Known Allergies  Current Outpatient Prescriptions:  .  ALPRAZolam (XANAX) 0.5 MG tablet, Take 1 tablet (0.5 mg total) by mouth 3 (three) times daily as needed for anxiety., Disp: 90 tablet, Rfl: 1 .  aspirin 81 MG tablet, Take 81 mg by mouth daily., Disp: , Rfl:  .  benazepril (LOTENSIN) 40 MG tablet, Take 1 tablet (40 mg total) by mouth daily., Disp: 90 tablet, Rfl: 2 .  Calcium Carbonate-Vitamin D (CALCIUM + D PO), Take 1 tablet by mouth daily. , Disp: , Rfl:  .   cetirizine (ZYRTEC) 10 MG tablet, Take 10 mg by mouth daily., Disp: , Rfl:  .  colchicine 0.6 MG tablet, Take 0.6 mg by mouth 2 (two) times daily as needed. , Disp: , Rfl:  .  ferrous sulfate 325 (65 FE) MG tablet, Take 1 tablet (325 mg total) by mouth 3 (three) times daily after meals., Disp: , Rfl: 3 .  gabapentin (NEURONTIN) 300 MG capsule, Take 1 capsule (300 mg total) by mouth at bedtime., Disp: 30 capsule, Rfl: 5 .  glimepiride (AMARYL) 4 MG tablet, Take 1 tablet (4 mg total) by mouth daily with breakfast., Disp: 90 tablet, Rfl: 1 .  glucose blood (ONE TOUCH ULTRA TEST) test strip, Use as instructed, Disp: 100 each, Rfl: 12 .  hydrochlorothiazide (HYDRODIURIL) 25 MG tablet, Take 1 tablet (25 mg total) by mouth daily., Disp: 90 tablet, Rfl: 1 .  indomethacin (INDOCIN) 50 MG capsule, Take 50 mg by mouth every 8 (eight) hours as needed. Reported on 08/13/2015, Disp: , Rfl:  .  levothyroxine (SYNTHROID, LEVOTHROID) 150 MCG tablet, Take 1 tablet (150 mcg total)  by mouth daily before breakfast., Disp: 90 tablet, Rfl: 0 .  metFORMIN (GLUCOPHAGE) 1000 MG tablet, Take 1 tablet (1,000 mg total) by mouth 2 (two) times daily with a meal., Disp: 180 tablet, Rfl: 2 .  metoprolol (LOPRESSOR) 50 MG tablet, Take 0.5 tablets (25 mg total) by mouth 2 (two) times daily., Disp: 30 tablet, Rfl: 5 .  ondansetron (ZOFRAN) 4 MG tablet, Take 1 tablet (4 mg total) by mouth every 8 (eight) hours as needed for nausea or vomiting., Disp: 20 tablet, Rfl: 0 .  pioglitazone (ACTOS) 30 MG tablet, Take 1 tablet (30 mg total) by mouth daily., Disp: 90 tablet, Rfl: 0 .  rosuvastatin (CRESTOR) 10 MG tablet, Take 1 tablet (10 mg total) by mouth daily., Disp: 90 tablet, Rfl: 1 .  sertraline (ZOLOFT) 50 MG tablet, Take 3 tablets (150 mg total) by mouth daily., Disp: 270 tablet, Rfl: 1 .  Vitamin D, Ergocalciferol, (DRISDOL) 50000 units CAPS capsule, Take 1 capsule (50,000 Units total) by mouth every 7 (seven) days., Disp: 4 capsule,  Rfl: 3 .  permethrin (ELIMITE) 5 % cream, Massage cream (around 30 g) on whole body from head to toe, leave face. Leave on for 8-14 hrs before showering it off. Repeat in 14 days., Disp: 60 g, Rfl: 1  Objective: BP (!) 130/58 (BP Location: Left Arm, Patient Position: Sitting, Cuff Size: Large)   Pulse 66   Temp 97.6 F (36.4 C) (Oral)   Ht 5\' 5"  (1.651 m)   Wt 260 lb 6.4 oz (118.1 kg)   SpO2 97%   BMI 43.33 kg/m  General: Awake, appears stated age Lungs: No accessory muscle use Skin: tracking areas of excoriation noted on various portions of body, present in interweb spaces of hands, local erythema, no fluctuance or drainage, no TTP. Psych: Age appropriate judgment and insight, normal affect and mood  Assessment and Plan: Scabies - Plan: permethrin (ELIMITE) 5 % cream  Orders as above. 1 tx, repeat in 14 days. Clothing and sheets through a dryer cycle. Pt handout given for scabies info. F/u prn. The patient voiced understanding and agreement to the plan.  [place], DO 05/29/16  5:09 PM

## 2016-05-29 NOTE — Progress Notes (Signed)
Pre visit review using our clinic review tool, if applicable. No additional management support is needed unless otherwise documented below in the visit note. 

## 2016-06-08 ENCOUNTER — Other Ambulatory Visit: Payer: Self-pay | Admitting: Internal Medicine

## 2016-06-12 DIAGNOSIS — G4733 Obstructive sleep apnea (adult) (pediatric): Secondary | ICD-10-CM | POA: Diagnosis not present

## 2016-06-19 DIAGNOSIS — L03116 Cellulitis of left lower limb: Secondary | ICD-10-CM | POA: Diagnosis not present

## 2016-07-12 DIAGNOSIS — G4733 Obstructive sleep apnea (adult) (pediatric): Secondary | ICD-10-CM | POA: Diagnosis not present

## 2016-07-26 ENCOUNTER — Other Ambulatory Visit: Payer: Self-pay | Admitting: Internal Medicine

## 2016-07-28 NOTE — Progress Notes (Addendum)
Subjective:   [name] is a 66 y.o. female who presents for Medicare Annual (Subsequent) preventive examination.  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 She has lost several close family members over the last 6-7 years which has caused some depression and anxiety.   Review of Systems:  No ROS.  Medicare Wellness Visit.  Cardiac Risk Factors include: advanced age (>54men, >42 women);diabetes mellitus;dyslipidemia;hypertension  Sleep patterns: has difficulty falling asleep and sleeps 4 hours nightly. Interrupted sleep due to taking care of her husband. He gets up about 3 times nightly to void and she has to wake up to help him. Currently she is sleeping on the sofa in their living room to be close to her husband, who sleeps in the recliner. Occasionally takes a 'catnap' during the day. Goes to bed around 10-11pm, wears CPAP nightly. Home Safety/Smoke Alarms: Feels safe in home. Smoke alarms in place.  Living environment; residence and Firearm Safety: Lives w/ husband and has close neighbors who check on her and house-sit when she is out of town. 1-story house/ trailer, walk-in shower, firearms stored safely.   Counseling:   Eye Exam- Follows w/ [facility]. Due for exam soon.  Female:   Pap- Aged out      Mammo- last 12/15/13. BI-RADS CATEGORY  3: Probably benign.       Dexa scan- last 08/11/14. Normal.      CCS- last 09/04/12. Moderate diverticulosis, otherwise normal. 10 year recall.      Objective:     Vitals: BP 134/72 (BP Location: Left Arm, Patient Position: Sitting, Cuff Size: Normal)   Pulse 72   Temp 97.5 F (36.4 C) (Oral)   Resp 14   Ht 5\' 5"  (1.651 m)   Wt 262 lb 2 oz (118.9 kg)   SpO2 99%   BMI 43.62 kg/m   Body mass index is 43.62 kg/m.   Tobacco History  Smoking Status  . Former Smoker  . Packs/day: 0.50  . Years: 10.00  . Types: Cigarettes  . Quit date: 02/28/1980  Smokeless Tobacco  . Never Used     Counseling  given: No   Past Medical History:  Diagnosis Date  . Abnormal nerve conduction studies 08/2015   moderate R carpal tunnel syndrome   . Bradycardia   . Complication of anesthesia    HR dropped to 20  . Depression   . Diabetes mellitus    has diarrhea from medicine  . Dyslipidemia   . Goiter    h/o  . Hypertension   . Hypothyroidism    w/ h/o goiter  . Morbid obesity ([place])   . Osteoarthritis   . PONV (postoperative nausea and vomiting)   . Seasonal allergies   . Sleep apnea    on CPAP   Past Surgical History:  Procedure Laterality Date  . APPENDECTOMY  1970's  . CARPAL TUNNEL RELEASE Right 01/2016  . COLONOSCOPY    . KNEE ARTHROSCOPY W/ MENISCECTOMY Right 2007  . TOTAL KNEE ARTHROPLASTY  08/05/07   right  . TOTAL KNEE ARTHROPLASTY Left 07/01/2013   Procedure: LEFT TOTAL KNEE ARTHROPLASTY;  Surgeon: Mauri Pole, MD;  Location: [facility];  Service: Orthopedics;  Laterality: Left;   Family History  Problem Relation Age of Onset  . Stomach cancer Brother   . Esophageal cancer Brother   . Coronary artery disease Father        F MI age 78, 53 at age 66  .  Hypertension Father   . Diabetes Mother        M , brother amd sister  . Lung cancer Mother        around the lungs  . CAD Brother        died age 26, h/o DM-Tobacco-PVD  . Colon cancer Neg Hx   . Breast cancer Neg Hx   . Rectal cancer Neg Hx    History  Sexual Activity  . Sexual activity: Not on file    Outpatient Encounter Prescriptions as of 07/31/2016  Medication Sig  . ALPRAZolam (XANAX) 0.5 MG tablet Take 1 tablet (0.5 mg total) by mouth 3 (three) times daily as needed for anxiety.  Marland Kitchen aspirin 81 MG tablet Take 81 mg by mouth daily.  . benazepril (LOTENSIN) 40 MG tablet Take 1 tablet (40 mg total) by mouth daily.  . Calcium Carbonate-Vitamin D (CALCIUM + D PO) Take 1 tablet by mouth daily.   . cetirizine (ZYRTEC) 10 MG tablet Take 10 mg by mouth daily.  . colchicine 0.6 MG tablet Take 0.6 mg by mouth 2 (two)  times daily as needed.   . gabapentin (NEURONTIN) 300 MG capsule Take 1 capsule (300 mg total) by mouth at bedtime.  Marland Kitchen glimepiride (AMARYL) 4 MG tablet Take 1 tablet (4 mg total) by mouth daily with breakfast.  . hydrochlorothiazide (HYDRODIURIL) 25 MG tablet Take 1 tablet (25 mg total) by mouth daily.  . indomethacin (INDOCIN) 50 MG capsule Take 50 mg by mouth every 8 (eight) hours as needed. Reported on 08/13/2015  . levothyroxine (SYNTHROID, LEVOTHROID) 150 MCG tablet Take 1 tablet (150 mcg total) by mouth daily before breakfast.  . metFORMIN (GLUCOPHAGE) 1000 MG tablet Take 1 tablet (1,000 mg total) by mouth 2 (two) times daily with a meal.  . metoprolol (LOPRESSOR) 50 MG tablet Take 0.5 tablets (25 mg total) by mouth 2 (two) times daily.  . pioglitazone (ACTOS) 30 MG tablet Take 1 tablet (30 mg total) by mouth daily.  . rosuvastatin (CRESTOR) 10 MG tablet Take 1 tablet (10 mg total) by mouth daily.  . [DISCONTINUED] ALPRAZolam (XANAX) 0.5 MG tablet Take 1 tablet (0.5 mg total) by mouth 3 (three) times daily as needed for anxiety.  Marland Kitchen glucose blood (ONE TOUCH ULTRA TEST) test strip Use as instructed (Patient not taking: Reported on 07/31/2016)  . sertraline (ZOLOFT) 100 MG tablet Take 2 tablets (200 mg total) by mouth daily.  . [DISCONTINUED] ferrous sulfate 325 (65 FE) MG tablet Take 1 tablet (325 mg total) by mouth 3 (three) times daily after meals. (Patient not taking: Reported on 07/31/2016)  . [DISCONTINUED] ondansetron (ZOFRAN) 4 MG tablet Take 1 tablet (4 mg total) by mouth every 8 (eight) hours as needed for nausea or vomiting. (Patient not taking: Reported on 07/31/2016)  . [DISCONTINUED] permethrin (ELIMITE) 5 % cream Massage cream (around 30 g) on whole body from head to toe, leave face. Leave on for 8-14 hrs before showering it off. Repeat in 14 days. (Patient not taking: Reported on 07/31/2016)  . [DISCONTINUED] sertraline (ZOLOFT) 50 MG tablet Take 3 tablets (150 mg total) by mouth daily.  (Patient not taking: Reported on 07/31/2016)  . [DISCONTINUED] Vitamin D, Ergocalciferol, (DRISDOL) 50000 units CAPS capsule Take 1 capsule (50,000 Units total) by mouth every 7 (seven) days. (Patient not taking: Reported on 07/31/2016)   No facility-administered encounter medications on file as of 07/31/2016.     Activities of Daily Living In your present state of health, do  you have any difficulty performing the following activities: 07/31/2016 02/29/2016  Hearing? N N  Vision? N N  Difficulty concentrating or making decisions? N N  Walking or climbing stairs? N N  Dressing or bathing? N N  Doing errands, shopping? N N  Preparing Food and eating ? N -  Using the Toilet? N -  In the past six months, have you accidently leaked urine? Y -  Do you have problems with loss of bowel control? N -  Managing your Medications? N -  Managing your Finances? N -  Housekeeping or managing your Housekeeping? N -  Some recent data might be hidden    Patient Care Team: Colon Branch, MD as PCP - Geoffry Paradise, MD as Consulting Physician (Physical Medicine and Rehabilitation) Aloha Gell, MD as Consulting Physician (Obstetrics and Gynecology)    Assessment:    Physical assessment deferred to PCP.  Exercise Activities and Dietary recommendations Current Exercise Habits: The patient does not participate in regular exercise at present, but does stay active working in the yard and taking care of her husband.  Diet (meal preparation, eat out, water intake, caffeinated beverages, dairy products, fruits and vegetables): in general, a "healthy" diet  , on average, 2 meals per day. 'I try to stay as healthy as possible, but occasionally you get the urge for chocolate.' Tries to eat fruit popsicles or fresh fruit for snack/dessert. Tries to avoid salt, but does like it baked potato w/ sour cream and butter. Drinks 2 16 oz bottles of water daily and some unsweetened/diet tea.      Goals    . Healthy  Lifestyle          Eat heart healthy diet (full of fruits, vegetables, whole grains, lean protein, water--limit salt, fat, and sugar intake) and increase physical activity as tolerated. Continue doing brain stimulating activities (puzzles, reading, adult coloring books, staying active) to keep memory sharp.       Fall Risk Fall Risk  07/31/2016 02/29/2016 08/13/2015 05/13/2015 12/07/2014  Falls in the past year? No No No No No   Depression Screen PHQ 2/9 Scores 07/31/2016 02/29/2016 08/13/2015 05/13/2015  PHQ - 2 Score 5 0 0 0  PHQ- 9 Score 13 - - -     Cognitive Function MMSE - Mini Mental State Exam 07/31/2016  Orientation to time 5  Orientation to Place 5  Registration 3  Attention/ Calculation 5  Recall 3  Language- name 2 objects 2  Language- repeat 1  Language- follow 3 step command 3  Language- read & follow direction 1  Write a sentence 1  Copy design 1  Total score 30        Immunization History  Administered Date(s) Administered  . Influenza Split 12/29/2010, 11/12/2013  . Influenza Whole 11/25/2007, 12/21/2008, 12/06/2009  . Influenza, High Dose Seasonal PF 12/07/2014  . Influenza, Seasonal, Injecte, Preservative Fre 02/01/2012  . Influenza,inj,Quad PF,36+ Mos 11/27/2012, 12/20/2015  . Pneumococcal Conjugate-13 08/06/2014  . Pneumococcal Polysaccharide-23 12/06/2009, 07/31/2016  . Td 02/03/2000  . Tdap 12/29/2010  . Zoster 02/01/2012   Screening Tests Health Maintenance  Topic Date Due  . OPHTHALMOLOGY EXAM  02/04/2012  . MAMMOGRAM  12/16/2014  . PNA vac Low Risk Adult (2 of 2 - PPSV23) 08/06/2018 (Originally 08/06/2015)  . INFLUENZA VACCINE  09/27/2016  . HEMOGLOBIN A1C  10/10/2016  . FOOT EXAM  07/31/2017  . TETANUS/TDAP  12/28/2020  . COLONOSCOPY  09/05/2022  . DEXA SCAN  Completed  .  Hepatitis C Screening  Completed      Plan:    Follow-up w/ PCP as scheduled.   Create and bring a copy of your advance directives to your next office visit.  Orders  placed for MMG and DEXA.   PPSV-23 given.   Emmi education assigned via portal: ADVANCE DIRECTIVES; DIABETES - FOOT CARE  I have personally reviewed and noted the following in the patient's chart:   . Medical and social history . Use of alcohol, tobacco or illicit drugs  . Current medications and supplements . Functional ability and status . Nutritional status . Physical activity . Advanced directives . List of other physicians . Vitals . Screenings to include cognitive, depression, and falls . Referrals and appointments  In addition, I have reviewed and discussed with patient certain preventive protocols, quality metrics, and best practice recommendations. A written personalized care plan for preventive services as well as general preventive health recommendations were provided to patient.     Dorrene German, RN  07/31/2016  Kathlene November, MD

## 2016-07-31 ENCOUNTER — Encounter: Payer: Self-pay | Admitting: Internal Medicine

## 2016-07-31 ENCOUNTER — Ambulatory Visit (INDEPENDENT_AMBULATORY_CARE_PROVIDER_SITE_OTHER): Payer: Medicare HMO | Admitting: Internal Medicine

## 2016-07-31 VITALS — BP 134/72 | HR 72 | Temp 97.5°F | Resp 14 | Ht 65.0 in | Wt 262.1 lb

## 2016-07-31 DIAGNOSIS — E119 Type 2 diabetes mellitus without complications: Secondary | ICD-10-CM

## 2016-07-31 DIAGNOSIS — Z1239 Encounter for other screening for malignant neoplasm of breast: Secondary | ICD-10-CM

## 2016-07-31 DIAGNOSIS — I10 Essential (primary) hypertension: Secondary | ICD-10-CM | POA: Diagnosis not present

## 2016-07-31 DIAGNOSIS — F329 Major depressive disorder, single episode, unspecified: Secondary | ICD-10-CM

## 2016-07-31 DIAGNOSIS — D649 Anemia, unspecified: Secondary | ICD-10-CM

## 2016-07-31 DIAGNOSIS — E559 Vitamin D deficiency, unspecified: Secondary | ICD-10-CM | POA: Diagnosis not present

## 2016-07-31 DIAGNOSIS — F419 Anxiety disorder, unspecified: Secondary | ICD-10-CM

## 2016-07-31 DIAGNOSIS — Z780 Asymptomatic menopausal state: Secondary | ICD-10-CM

## 2016-07-31 DIAGNOSIS — F32A Depression, unspecified: Secondary | ICD-10-CM

## 2016-07-31 DIAGNOSIS — Z23 Encounter for immunization: Secondary | ICD-10-CM | POA: Diagnosis not present

## 2016-07-31 DIAGNOSIS — R69 Illness, unspecified: Secondary | ICD-10-CM | POA: Diagnosis not present

## 2016-07-31 DIAGNOSIS — Z0000 Encounter for general adult medical examination without abnormal findings: Secondary | ICD-10-CM | POA: Diagnosis not present

## 2016-07-31 LAB — BASIC METABOLIC PANEL
BUN: 39 mg/dL — ABNORMAL HIGH (ref 6–23)
CHLORIDE: 107 meq/L (ref 96–112)
CO2: 22 meq/L (ref 19–32)
CREATININE: 1.2 mg/dL (ref 0.40–1.20)
Calcium: 9.1 mg/dL (ref 8.4–10.5)
GFR: 47.61 mL/min — ABNORMAL LOW (ref >60.00)
Glucose, Bld: 163 mg/dL — ABNORMAL HIGH (ref 70–99)
Potassium: 4.6 mEq/L (ref 3.5–5.1)
Sodium: 138 mEq/L (ref 135–145)

## 2016-07-31 LAB — CBC WITH DIFFERENTIAL/PLATELET
BASOS PCT: 0.7 % (ref 0.0–3.0)
Basophils Absolute: 0.1 10*3/uL (ref 0.0–0.1)
EOS ABS: 0.3 10*3/uL (ref 0.0–0.7)
EOS PCT: 4.1 % (ref 0.0–5.0)
HEMATOCRIT: 38.6 % (ref 36.0–46.0)
Hemoglobin: 12.6 g/dL (ref 12.0–15.0)
LYMPHS PCT: 24.4 % (ref 12.0–46.0)
Lymphs Abs: 1.8 10*3/uL (ref 0.7–4.0)
MCHC: 32.7 g/dL (ref 30.0–36.0)
MCV: 89.1 fl (ref 78.0–100.0)
MONOS PCT: 8.1 % (ref 3.0–12.0)
Monocytes Absolute: 0.6 10*3/uL (ref 0.1–1.0)
NEUTROS ABS: 4.6 10*3/uL (ref 1.4–7.7)
Neutrophils Relative %: 62.7 % (ref 43.0–77.0)
PLATELETS: 268 10*3/uL (ref 150.0–400.0)
RBC: 4.33 Mil/uL (ref 3.87–5.11)
RDW: 15.6 % — AB (ref 11.5–15.5)
WBC: 7.4 10*3/uL (ref 4.0–10.5)

## 2016-07-31 LAB — IRON: Iron: 52 ug/dL (ref 42–145)

## 2016-07-31 LAB — FERRITIN: Ferritin: 53.2 ng/mL (ref 10.0–291.0)

## 2016-07-31 LAB — HEMOGLOBIN A1C: Hgb A1c MFr Bld: 7.1 % — ABNORMAL HIGH (ref 4.6–6.5)

## 2016-07-31 MED ORDER — ALPRAZOLAM 0.5 MG PO TABS: 0.5000 mg | ORAL_TABLET | Freq: Three times a day (TID) | ORAL | 1 refills | Status: DC | PRN

## 2016-07-31 MED ORDER — SERTRALINE HCL 100 MG PO TABS: 200.0000 mg | ORAL_TABLET | Freq: Every day | ORAL | 2 refills | Status: DC

## 2016-07-31 NOTE — Patient Instructions (Addendum)
GO TO THE LAB : Get the blood work and UDS   GO TO THE FRONT DESK Schedule your next appointment for a  checkup in 6 weeks   Increase sertraline to: 100 mg tablets, 2 tablets daily  See a counselor   [name] , Thank you for taking time to come for your Medicare Wellness Visit. I appreciate your ongoing commitment to your health goals. Please review the following plan we discussed and let me know if I can assist you in the future.   You got your pneumoccocal 23 vaccine today.  Create and bring a copy of your advance directives to your next office visit.  Schedule your diabetic eye exam at your convenience.   These are the goals we discussed: Goals    . Healthy Lifestyle          Eat heart healthy diet (full of fruits, vegetables, whole grains, lean protein, water--limit salt, fat, and sugar intake) and increase physical activity as tolerated. Continue doing brain stimulating activities (puzzles, reading, adult coloring books, staying active) to keep memory sharp.        This is a list of the screening recommended for you and due dates:  Health Maintenance  Topic Date Due  . Eye exam for diabetics  02/04/2012  . Mammogram  12/16/2014  . Pneumonia vaccines (2 of 2 - PPSV23) 08/06/2018*  . Flu Shot  09/27/2016  . Hemoglobin A1C  10/10/2016  . Complete foot exam   07/31/2017  . Tetanus Vaccine  12/28/2020  . Colon Cancer Screening  09/05/2022  . DEXA scan (bone density measurement)  Completed  .  Hepatitis C: One time screening is recommended by Center for Disease Control  (CDC) for  adults born from [birth year] through 1966.   Completed  *Topic was postponed. The date shown is not the original due date.    Diabetes and Foot Care Diabetes may cause you to have problems because of poor blood supply (circulation) to your feet and legs. This may cause the skin on your feet to become thinner, break easier, and heal more slowly. Your skin may become dry, and the skin may peel and  crack. You may also have nerve damage in your legs and feet causing decreased feeling in them. You may not notice minor injuries to your feet that could lead to infections or more serious problems. Taking care of your feet is one of the most important things you can do for yourself. Follow these instructions at home:  Wear shoes at all times, even in the house. Do not go barefoot. Bare feet are easily injured.  Check your feet daily for blisters, cuts, and redness. If you cannot see the bottom of your feet, use a mirror or ask someone for help.  Wash your feet with warm water (do not use hot water) and mild soap. Then pat your feet and the areas between your toes until they are completely dry. Do not soak your feet as this can dry your skin.  Apply a moisturizing lotion or petroleum jelly (that does not contain alcohol and is unscented) to the skin on your feet and to dry, brittle toenails. Do not apply lotion between your toes.  Trim your toenails straight across. Do not dig under them or around the cuticle. File the edges of your nails with an emery board or nail file.  Do not cut corns or calluses or try to remove them with medicine.  Wear clean socks or stockings every  day. Make sure they are not too tight. Do not wear knee-high stockings since they may decrease blood flow to your legs.  Wear shoes that fit properly and have enough cushioning. To break in new shoes, wear them for just a few hours a day. This prevents you from injuring your feet. Always look in your shoes before you put them on to be sure there are no objects inside.  Do not cross your legs. This may decrease the blood flow to your feet.  If you find a minor scrape, cut, or break in the skin on your feet, keep it and the skin around it clean and dry. These areas may be cleansed with mild soap and water. Do not cleanse the area with peroxide, alcohol, or iodine.  When you remove an adhesive bandage, be sure not to damage the  skin around it.  If you have a wound, look at it several times a day to make sure it is healing.  Do not use heating pads or hot water bottles. They may burn your skin. If you have lost feeling in your feet or legs, you may not know it is happening until it is too late.  Make sure your health care provider performs a complete foot exam at least annually or more often if you have foot problems. Report any cuts, sores, or bruises to your health care provider immediately. Contact a health care provider if:  You have an injury that is not healing.  You have cuts or breaks in the skin.  You have an ingrown nail.  You notice redness on your legs or feet.  You feel burning or tingling in your legs or feet.  You have pain or cramps in your legs and feet.  Your legs or feet are numb.  Your feet always feel cold. Get help right away if:  There is increasing redness, swelling, or pain in or around a wound.  There is a red line that goes up your leg.  Pus is coming from a wound.  You develop a fever or as directed by your health care provider.  You notice a bad smell coming from an ulcer or wound. This information is not intended to replace advice given to you by your health care provider. Make sure you discuss any questions you have with your health care provider. Document Released: 02/11/2000 Document Revised: 07/22/2015 Document Reviewed: 07/23/2012 Elsevier Interactive Patient Education  2017 [place] 65 Years and Older, Female Preventive care refers to lifestyle choices and visits with your health care provider that can promote health and wellness. What does preventive care include?  A yearly physical exam. This is also called an annual well check.  Dental exams once or twice a year.  Routine eye exams. Ask your health care provider how often you should have your eyes checked.  Personal lifestyle choices, including: ? Daily care of your teeth and  gums. ? Regular physical activity. ? Eating a healthy diet. ? Avoiding tobacco and drug use. ? Limiting alcohol use. ? Practicing safe sex. ? Taking low-dose aspirin every day. ? Taking vitamin and mineral supplements as recommended by your health care provider. What happens during an annual well check? The services and screenings done by your health care provider during your annual well check will depend on your age, overall health, lifestyle risk factors, and family history of disease. Counseling Your health care provider may ask you questions about your:  Alcohol use.  Tobacco  use.  Drug use.  Emotional well-being.  Home and relationship well-being.  Sexual activity.  Eating habits.  History of falls.  Memory and ability to understand (cognition).  Work and work Statistician.  Reproductive health.  Screening You may have the following tests or measurements:  Height, weight, and BMI.  Blood pressure.  Lipid and cholesterol levels. These may be checked every 5 years, or more frequently if you are over 70 years old.  Skin check.  Lung cancer screening. You may have this screening every year starting at age 46 if you have a 30-pack-year history of smoking and currently smoke or have quit within the past 15 years.  Fecal occult blood test (FOBT) of the stool. You may have this test every year starting at age 13.  Flexible sigmoidoscopy or colonoscopy. You may have a sigmoidoscopy every 5 years or a colonoscopy every 10 years starting at age 31.  Hepatitis C blood test.  Hepatitis B blood test.  Sexually transmitted disease (STD) testing.  Diabetes screening. This is done by checking your blood sugar (glucose) after you have not eaten for a while (fasting). You may have this done every 1-3 years.  Bone density scan. This is done to screen for osteoporosis. You may have this done starting at age 55.  Mammogram. This may be done every 1-2 years. Talk to your  health care provider about how often you should have regular mammograms.  Talk with your health care provider about your test results, treatment options, and if necessary, the need for more tests. Vaccines Your health care provider may recommend certain vaccines, such as:  Influenza vaccine. This is recommended every year.  Tetanus, diphtheria, and acellular pertussis (Tdap, Td) vaccine. You may need a Td booster every 10 years.  Varicella vaccine. You may need this if you have not been vaccinated.  Zoster vaccine. You may need this after age 55.  Measles, mumps, and rubella (MMR) vaccine. You may need at least one dose of MMR if you were born in 1957 or later. You may also need a second dose.  Pneumococcal 13-valent conjugate (PCV13) vaccine. One dose is recommended after age 71.  Pneumococcal polysaccharide (PPSV23) vaccine. One dose is recommended after age 16.  Meningococcal vaccine. You may need this if you have certain conditions.  Hepatitis A vaccine. You may need this if you have certain conditions or if you travel or work in places where you may be exposed to hepatitis A.  Hepatitis B vaccine. You may need this if you have certain conditions or if you travel or work in places where you may be exposed to hepatitis B.  Haemophilus influenzae type b (Hib) vaccine. You may need this if you have certain conditions.  Talk to your health care provider about which screenings and vaccines you need and how often you need them. This information is not intended to replace advice given to you by your health care provider. Make sure you discuss any questions you have with your health care provider. Document Released: 03/12/2015 Document Revised: 11/03/2015 Document Reviewed: 12/15/2014 Elsevier Interactive Patient Education  2017 [place].  Pneumococcal Polysaccharide Vaccine: What You Need to Know 1. Why get vaccinated? Vaccination can protect older adults (and some children and  younger adults) from pneumococcal disease. Pneumococcal disease is caused by bacteria that can spread from person to person through close contact. It can cause ear infections, and it can also lead to more serious infections of the:  Lungs (pneumonia),  Blood (  bacteremia), and  Covering of the brain and spinal cord (meningitis). Meningitis can cause deafness and brain damage, and it can be fatal.  Anyone can get pneumococcal disease, but children under 48 years of age, people with certain medical conditions, adults over 87 years of age, and cigarette smokers are at the highest risk. About 18,000 older adults die each year from pneumococcal disease in the [place]. Treatment of pneumococcal infections with penicillin and other drugs used to be more effective. But some strains of the disease have become resistant to these drugs. This makes prevention of the disease, through vaccination, even more important. 2. Pneumococcal polysaccharide vaccine (PPSV23) Pneumococcal polysaccharide vaccine (PPSV23) protects against 23 types of pneumococcal bacteria. It will not prevent all pneumococcal disease. PPSV23 is recommended for:  All adults 52 years of age and older,  Anyone 2 through 67 years of age with certain long-term health problems,  Anyone 2 through 67 years of age with a weakened immune system,  Adults 64 through 67 years of age who smoke cigarettes or have asthma.  Most people need only one dose of PPSV. A second dose is recommended for certain high-risk groups. People 63 and older should get a dose even if they have gotten one or more doses of the vaccine before they turned 65. Your healthcare provider can give you more information about these recommendations. Most healthy adults develop protection within 2 to 3 weeks of getting the shot. 3. Some people should not get this vaccine  Anyone who has had a life-threatening allergic reaction to PPSV should not get another dose.  Anyone  who has a severe allergy to any component of PPSV should not receive it. Tell your provider if you have any severe allergies.  Anyone who is moderately or severely ill when the shot is scheduled may be asked to wait until they recover before getting the vaccine. Someone with a mild illness can usually be vaccinated.  Children less than 88 years of age should not receive this vaccine.  There is no evidence that PPSV is harmful to either a pregnant woman or to her fetus. However, as a precaution, women who need the vaccine should be vaccinated before becoming pregnant, if possible. 4. Risks of a vaccine reaction With any medicine, including vaccines, there is a chance of side effects. These are usually mild and go away on their own, but serious reactions are also possible. About half of people who get PPSV have mild side effects, such as redness or pain where the shot is given, which go away within about two days. Less than 1 out of 100 people develop a fever, muscle aches, or more severe local reactions. Problems that could happen after any vaccine:  People sometimes faint after a medical procedure, including vaccination. Sitting or lying down for about 15 minutes can help prevent fainting, and injuries caused by a fall. Tell your doctor if you feel dizzy, or have vision changes or ringing in the ears.  Some people get severe pain in the shoulder and have difficulty moving the arm where a shot was given. This happens very rarely.  Any medication can cause a severe allergic reaction. Such reactions from a vaccine are very rare, estimated at about 1 in a million doses, and would happen within a few minutes to a few hours after the vaccination. As with any medicine, there is a very remote chance of a vaccine causing a serious injury or death. The safety of vaccines is always being  monitored. For more information, visit: http://www.aguilar.org/ 5. What if there is a serious reaction? What should I  look for? Look for anything that concerns you, such as signs of a severe allergic reaction, very high fever, or unusual behavior. Signs of a severe allergic reaction can include hives, swelling of the face and throat, difficulty breathing, a fast heartbeat, dizziness, and weakness. These would usually start a few minutes to a few hours after the vaccination. What should I do? If you think it is a severe allergic reaction or other emergency that can't wait, call 9-1-1 or get to the nearest hospital. Otherwise, call your doctor. Afterward, the reaction should be reported to the Vaccine Adverse Event Reporting System (VAERS). Your doctor might file this report, or you can do it yourself through the VAERS web site at www.vaers.SamedayNews.es, or by calling 309-290-6215. VAERS does not give medical advice. 6. How can I learn more?  Ask your doctor. He or she can give you the vaccine package insert or suggest other sources of information.  Call your local or state health department.  Contact the Centers for Disease Control and Prevention (CDC): ? Call (845)246-6030 (1-800-CDC-INFO) or ? Visit CDC's website at http://hunter.com/ CDC Pneumococcal Polysaccharide Vaccine VIS (06/20/13) This information is not intended to replace advice given to you by your health care provider. Make sure you discuss any questions you have with your health care provider. Document Released: 12/11/2005 Document Revised: 11/04/2015 Document Reviewed: 11/04/2015 Elsevier Interactive Patient Education  2017 [employer].

## 2016-07-31 NOTE — Progress Notes (Signed)
Subjective:    Patient ID: [name], female    DOB: 1951-02-02, 66 y.o.   MRN: [id number]  DOS:  07/31/2016 Type of visit - description : Routine checkup Interval history:  Anxiety depression: Husband is disabled, she is the caregiver, she has been  less patient lately; unable to sleep well because husband has  nocturia and she needs to help him at night. Run out of sertraline 1 week ago. Change or increase the dose of SSRIs? DM: Good medication compliance, CBGs in the 100s. Crestor, good compliance of medication. HTN: Good compliance with meds, ambulatory BPs when checked within normal Hypothyroidism: On Synthroid, last TSH normal Vitamin D deficiency, finished ergocalciferol.  Review of Systems  Denies nausea, vomiting, diarrhea. No blood in the stools. Left great toe is occasionally numb No suicidal or homicidal ideas.   Past Medical History:  Diagnosis Date  . Abnormal nerve conduction studies 08/2015   moderate R carpal tunnel syndrome   . Bradycardia   . Complication of anesthesia    HR dropped to 20  . Depression   . Diabetes mellitus    has diarrhea from medicine  . Dyslipidemia   . Goiter    h/o  . Hypertension   . Hypothyroidism    w/ h/o goiter  . Morbid obesity ([place])   . Osteoarthritis   . PONV (postoperative nausea and vomiting)   . Seasonal allergies   . Sleep apnea    on CPAP    Past Surgical History:  Procedure Laterality Date  . APPENDECTOMY  1970's  . CARPAL TUNNEL RELEASE Right 01/2016  . COLONOSCOPY    . KNEE ARTHROSCOPY W/ MENISCECTOMY Right 2007  . TOTAL KNEE ARTHROPLASTY  08/05/07   right  . TOTAL KNEE ARTHROPLASTY Left 07/01/2013   Procedure: LEFT TOTAL KNEE ARTHROPLASTY;  Surgeon: Mauri Pole, MD;  Location: [facility];  Service: Orthopedics;  Laterality: Left;    Social History   Social History  . Marital status: Married    Spouse name: N/A  . Number of children: 1  . Years of education: N/A   Occupational History  . retired  01-2014    Social History Main Topics  . Smoking status: Former Smoker    Packs/day: 0.50    Years: 10.00    Types: Cigarettes    Quit date: 02/28/1980  . Smokeless tobacco: Never Used  . Alcohol use Yes     Comment: occasionally  . Drug use: No  . Sexual activity: Not on file   Other Topics Concern  . Not on file   Social History Narrative   1 child: lost child 01/2010     Lost brother ~ 26   Lost parents ~ 2016   Husband disable: disability 1991, strokes, bipolar, DM, wheelchair bound   Sister lives in [place], extended family spread throughout  [place]      Allergies as of 07/31/2016   No Known Allergies     Medication List       Accurate as of 07/31/16  7:42 PM. Always use your most recent med list.          ALPRAZolam 0.5 MG tablet Commonly known as:  XANAX Take 1 tablet (0.5 mg total) by mouth 3 (three) times daily as needed for anxiety.   aspirin 81 MG tablet Take 81 mg by mouth daily.   benazepril 40 MG tablet Commonly known as:  LOTENSIN Take 1 tablet (40 mg total) by mouth daily.  CALCIUM + D PO Take 1 tablet by mouth daily.   cetirizine 10 MG tablet Commonly known as:  ZYRTEC Take 10 mg by mouth daily.   colchicine 0.6 MG tablet Take 0.6 mg by mouth 2 (two) times daily as needed.   gabapentin 300 MG capsule Commonly known as:  NEURONTIN Take 1 capsule (300 mg total) by mouth at bedtime.   glimepiride 4 MG tablet Commonly known as:  AMARYL Take 1 tablet (4 mg total) by mouth daily with breakfast.   glucose blood test strip Commonly known as:  ONE TOUCH ULTRA TEST Use as instructed   hydrochlorothiazide 25 MG tablet Commonly known as:  HYDRODIURIL Take 1 tablet (25 mg total) by mouth daily.   indomethacin 50 MG capsule Commonly known as:  INDOCIN Take 50 mg by mouth every 8 (eight) hours as needed. Reported on 08/13/2015   levothyroxine 150 MCG tablet Commonly known as:  SYNTHROID, LEVOTHROID Take 1 tablet (150 mcg total) by mouth  daily before breakfast.   metFORMIN 1000 MG tablet Commonly known as:  GLUCOPHAGE Take 1 tablet (1,000 mg total) by mouth 2 (two) times daily with a meal.   metoprolol tartrate 50 MG tablet Commonly known as:  LOPRESSOR Take 0.5 tablets (25 mg total) by mouth 2 (two) times daily.   pioglitazone 30 MG tablet Commonly known as:  ACTOS Take 1 tablet (30 mg total) by mouth daily.   rosuvastatin 10 MG tablet Commonly known as:  CRESTOR Take 1 tablet (10 mg total) by mouth daily.   sertraline 100 MG tablet Commonly known as:  ZOLOFT Take 2 tablets (200 mg total) by mouth daily.          Objective:   Physical Exam BP 134/72 (BP Location: Left Arm, Patient Position: Sitting, Cuff Size: Normal)   Pulse 72   Temp 97.5 F (36.4 C) (Oral)   Resp 14   Ht 5\' 5"  (1.651 m)   Wt 262 lb 2 oz (118.9 kg)   SpO2 99%   BMI 43.62 kg/m  General:   Well developed, well nourished . NAD.  HEENT:  Normocephalic . Face symmetric, atraumatic Lungs:  CTA B Normal respiratory effort, no intercostal retractions, no accessory muscle use. Heart: RRR,  no murmur.  Trace  pretibial edema bilaterally  Skin: Not pale. Not jaundice DIABETIC FEET EXAM: Normal pedal pulses bilaterally Skin normal, nails normal, no calluses Pinprick examination of the feet normal. Neurologic:  alert & oriented X3.  Speech normal, gait appropriate for age and unassisted Psych--  Cognition and judgment appear intact.  Cooperative with normal attention span and concentration.  Behavior appropriate. No anxious or depressed appearing.      Assessment & Plan:   Assessment   DM: w/ neuropathy, L toe numbness, foot exam 07-2016 negative HTN Hyperlipidemia Hypothyroidism, h/o goiter Depression- anxiety H/o Bronchospasm DJD GOUT-- dx else where ~ 05-2015, sx decreased w/  colchicine ?RLS-- sx decreased w/ gabapentin (2016) Vit D def  Morbid obesity OSA on CPAP Dr Halford Chessman Bradycardia after  anesthesia  PLAN: Pneumonia: Since the last visit, chest x-ray was done and normal. DM: Good compliance with meds, checking an A1c. She has some numbness of the right great toe but foot exam is normal. Feet are discussed. HTN: Good BPs, check a BMP Depression anxiety: Currently on sertraline 150 mg daily (ran out a week ago), sxs are not as well-controlled as before, she is "losing patience" with her disabled husband . Recommend to seek counseling, she is  more receptive to that today, increase sertraline to 200 mg.  UDS and contract RTC 6 weeks. Vitamin D deficiency: Status post ergocalciferol, now on OTCs, checking labs Anemia: Mild anemia per last CBC, she has been taking a sporadic iron OTC. Last colonoscopy 2014 normal. Recheck CBC, iron, ferritin RTC 6 weeks

## 2016-07-31 NOTE — Assessment & Plan Note (Signed)
Pneumonia: Since the last visit, chest x-ray was done and normal. DM: Good compliance with meds, checking an A1c. She has some numbness of the right great toe but foot exam is normal. Feet are discussed. HTN: Good BPs, check a BMP Depression anxiety: Currently on sertraline 150 mg daily (ran out a week ago), sxs are not as well-controlled as before, she is "losing patience" with her disabled husband . Recommend to seek counseling, she is more receptive to that today, increase sertraline to 200 mg.  UDS and contract RTC 6 weeks. Vitamin D deficiency: Status post ergocalciferol, now on OTCs, checking labs Anemia: Mild anemia per last CBC, she has been taking a sporadic iron OTC. Last colonoscopy 2014 normal. Recheck CBC, iron, ferritin RTC 6 weeks

## 2016-08-01 MED ORDER — METFORMIN HCL 1000 MG PO TABS: ORAL_TABLET | ORAL | 1 refills | Status: DC

## 2016-08-01 NOTE — Addendum Note (Signed)
Addended byDamita Dunnings D on: 08/01/2016 01:15 PM   Modules accepted: Orders

## 2016-08-03 LAB — VITAMIN D 1,25 DIHYDROXY
VITAMIN D 1, 25 (OH) TOTAL: 8 pg/mL — AB (ref 18–72)
Vitamin D3 1, 25 (OH)2: 8 pg/mL

## 2016-08-04 MED ORDER — VITAMIN D (ERGOCALCIFEROL) 1.25 MG (50000 UNIT) PO CAPS: 50000.0000 [IU] | ORAL_CAPSULE | ORAL | 0 refills | Status: DC

## 2016-08-04 NOTE — Addendum Note (Signed)
Addended byDamita Dunnings on: 08/04/2016 11:11 AM   Modules accepted: Orders

## 2016-08-09 DIAGNOSIS — E118 Type 2 diabetes mellitus with unspecified complications: Secondary | ICD-10-CM | POA: Diagnosis not present

## 2016-08-09 DIAGNOSIS — Z7984 Long term (current) use of oral hypoglycemic drugs: Secondary | ICD-10-CM | POA: Diagnosis not present

## 2016-08-09 DIAGNOSIS — G3184 Mild cognitive impairment, so stated: Secondary | ICD-10-CM | POA: Diagnosis not present

## 2016-08-09 DIAGNOSIS — Z0000 Encounter for general adult medical examination without abnormal findings: Secondary | ICD-10-CM | POA: Diagnosis not present

## 2016-08-09 DIAGNOSIS — Z6841 Body Mass Index (BMI) 40.0 and over, adult: Secondary | ICD-10-CM | POA: Diagnosis not present

## 2016-08-09 DIAGNOSIS — R69 Illness, unspecified: Secondary | ICD-10-CM | POA: Diagnosis not present

## 2016-08-09 DIAGNOSIS — E559 Vitamin D deficiency, unspecified: Secondary | ICD-10-CM | POA: Diagnosis not present

## 2016-08-09 DIAGNOSIS — Z79899 Other long term (current) drug therapy: Secondary | ICD-10-CM | POA: Diagnosis not present

## 2016-08-09 DIAGNOSIS — Z87891 Personal history of nicotine dependence: Secondary | ICD-10-CM | POA: Diagnosis not present

## 2016-08-09 DIAGNOSIS — E039 Hypothyroidism, unspecified: Secondary | ICD-10-CM | POA: Diagnosis not present

## 2016-08-09 DIAGNOSIS — E785 Hyperlipidemia, unspecified: Secondary | ICD-10-CM | POA: Diagnosis not present

## 2016-08-09 DIAGNOSIS — G4700 Insomnia, unspecified: Secondary | ICD-10-CM | POA: Diagnosis not present

## 2016-08-09 DIAGNOSIS — Z9989 Dependence on other enabling machines and devices: Secondary | ICD-10-CM | POA: Diagnosis not present

## 2016-08-09 DIAGNOSIS — G2581 Restless legs syndrome: Secondary | ICD-10-CM | POA: Diagnosis not present

## 2016-08-12 DIAGNOSIS — G4733 Obstructive sleep apnea (adult) (pediatric): Secondary | ICD-10-CM | POA: Diagnosis not present

## 2016-08-14 ENCOUNTER — Ambulatory Visit
Admission: RE | Admit: 2016-08-14 | Discharge: 2016-08-14 | Disposition: A | Discharge status: Home / Self Care | Payer: Medicare HMO | Source: Ambulatory Visit | Attending: Internal Medicine | Admitting: Internal Medicine

## 2016-08-14 DIAGNOSIS — Z1239 Encounter for other screening for malignant neoplasm of breast: Secondary | ICD-10-CM

## 2016-08-14 DIAGNOSIS — Z1231 Encounter for screening mammogram for malignant neoplasm of breast: Secondary | ICD-10-CM | POA: Diagnosis not present

## 2016-08-20 ENCOUNTER — Other Ambulatory Visit: Payer: Self-pay | Admitting: Internal Medicine

## 2016-09-07 ENCOUNTER — Other Ambulatory Visit: Payer: Self-pay | Admitting: Internal Medicine

## 2016-09-11 ENCOUNTER — Encounter: Payer: Self-pay | Admitting: Internal Medicine

## 2016-09-11 ENCOUNTER — Ambulatory Visit (INDEPENDENT_AMBULATORY_CARE_PROVIDER_SITE_OTHER): Payer: Medicare HMO | Admitting: Internal Medicine

## 2016-09-11 VITALS — BP 136/88 | HR 45 | Temp 97.5°F | Resp 14 | Ht 65.0 in | Wt 278.2 lb

## 2016-09-11 DIAGNOSIS — R600 Localized edema: Secondary | ICD-10-CM

## 2016-09-11 DIAGNOSIS — R69 Illness, unspecified: Secondary | ICD-10-CM | POA: Diagnosis not present

## 2016-09-11 DIAGNOSIS — F419 Anxiety disorder, unspecified: Secondary | ICD-10-CM | POA: Diagnosis not present

## 2016-09-11 DIAGNOSIS — E559 Vitamin D deficiency, unspecified: Secondary | ICD-10-CM | POA: Diagnosis not present

## 2016-09-11 DIAGNOSIS — E119 Type 2 diabetes mellitus without complications: Secondary | ICD-10-CM | POA: Diagnosis not present

## 2016-09-11 DIAGNOSIS — F32A Depression, unspecified: Secondary | ICD-10-CM

## 2016-09-11 DIAGNOSIS — F329 Major depressive disorder, single episode, unspecified: Secondary | ICD-10-CM

## 2016-09-11 MED ORDER — GABAPENTIN 300 MG PO CAPS: 300.0000 mg | ORAL_CAPSULE | Freq: Every day | ORAL | 1 refills | Status: DC

## 2016-09-11 NOTE — Patient Instructions (Addendum)
GO TO THE FRONT DESK Schedule your next appointment for a check up in 4 months       Diabetes and Foot Care Diabetes may cause you to have problems because of poor blood supply (circulation) to your feet and legs. This may cause the skin on your feet to become thinner, break easier, and heal more slowly. Your skin may become dry, and the skin may peel and crack. You may also have nerve damage in your legs and feet causing decreased feeling in them. You may not notice minor injuries to your feet that could lead to infections or more serious problems. Taking care of your feet is one of the most important things you can do for yourself. Follow these instructions at home:  Wear shoes at all times, even in the house. Do not go barefoot. Bare feet are easily injured.  Check your feet daily for blisters, cuts, and redness. If you cannot see the bottom of your feet, use a mirror or ask someone for help.  Wash your feet with warm water (do not use hot water) and mild soap. Then pat your feet and the areas between your toes until they are completely dry. Do not soak your feet as this can dry your skin.  Apply a moisturizing lotion or petroleum jelly (that does not contain alcohol and is unscented) to the skin on your feet and to dry, brittle toenails. Do not apply lotion between your toes.  Trim your toenails straight across. Do not dig under them or around the cuticle. File the edges of your nails with an emery board or nail file.  Do not cut corns or calluses or try to remove them with medicine.  Wear clean socks or stockings every day. Make sure they are not too tight. Do not wear knee-high stockings since they may decrease blood flow to your legs.  Wear shoes that fit properly and have enough cushioning. To break in new shoes, wear them for just a few hours a day. This prevents you from injuring your feet. Always look in your shoes before you put them on to be sure there are no objects  inside.  Do not cross your legs. This may decrease the blood flow to your feet.  If you find a minor scrape, cut, or break in the skin on your feet, keep it and the skin around it clean and dry. These areas may be cleansed with mild soap and water. Do not cleanse the area with peroxide, alcohol, or iodine.  When you remove an adhesive bandage, be sure not to damage the skin around it.  If you have a wound, look at it several times a day to make sure it is healing.  Do not use heating pads or hot water bottles. They may burn your skin. If you have lost feeling in your feet or legs, you may not know it is happening until it is too late.  Make sure your health care provider performs a complete foot exam at least annually or more often if you have foot problems. Report any cuts, sores, or bruises to your health care provider immediately. Contact a health care provider if:  You have an injury that is not healing.  You have cuts or breaks in the skin.  You have an ingrown nail.  You notice redness on your legs or feet.  You feel burning or tingling in your legs or feet.  You have pain or cramps in your legs and  feet.  Your legs or feet are numb.  Your feet always feel cold. Get help right away if:  There is increasing redness, swelling, or pain in or around a wound.  There is a red line that goes up your leg.  Pus is coming from a wound.  You develop a fever or as directed by your health care provider.  You notice a bad smell coming from an ulcer or wound. This information is not intended to replace advice given to you by your health care provider. Make sure you discuss any questions you have with your health care provider. Document Released: 02/11/2000 Document Revised: 07/22/2015 Document Reviewed: 07/23/2012 Elsevier Interactive Patient Education  2017 [employer].

## 2016-09-11 NOTE — Progress Notes (Signed)
Pre visit review using our clinic review tool, if applicable. No additional management support is needed unless otherwise documented below in the visit note. 

## 2016-09-11 NOTE — Progress Notes (Signed)
Subjective:    Patient ID: [name], female    DOB: 1950/05/02, 66 y.o.   MRN: [id number]  DOS:  09/11/2016 Type of visit - description : f/u Interval history: Depression: at last visit, meds were adjusted, she is better  DM: tolerating higher dose of metformin Gout: no classic podagra but sometimes hurts at the base of his feef, sx decrease w/ colchicine  Vit d def: on replacement    Review of Systems   Past Medical History:  Diagnosis Date  . Abnormal nerve conduction studies 08/2015   moderate R carpal tunnel syndrome   . Bradycardia   . Complication of anesthesia    HR dropped to 20  . Depression   . Diabetes mellitus    has diarrhea from medicine  . Dyslipidemia   . Goiter    h/o  . Hypertension   . Hypothyroidism    w/ h/o goiter  . Morbid obesity ([place])   . Osteoarthritis   . PONV (postoperative nausea and vomiting)   . Seasonal allergies   . Sleep apnea    on CPAP    Past Surgical History:  Procedure Laterality Date  . APPENDECTOMY  1970's  . CARPAL TUNNEL RELEASE Right 01/2016  . COLONOSCOPY    . KNEE ARTHROSCOPY W/ MENISCECTOMY Right 2007  . TOTAL KNEE ARTHROPLASTY  08/05/07   right  . TOTAL KNEE ARTHROPLASTY Left 07/01/2013   Procedure: LEFT TOTAL KNEE ARTHROPLASTY;  Surgeon: Mauri Pole, MD;  Location: [facility];  Service: Orthopedics;  Laterality: Left;    Social History   Social History  . Marital status: Married    Spouse name: N/A  . Number of children: 1  . Years of education: N/A   Occupational History  . retired 01-2014    Social History Main Topics  . Smoking status: Former Smoker    Packs/day: 0.50    Years: 10.00    Types: Cigarettes    Quit date: 02/28/1980  . Smokeless tobacco: Never Used  . Alcohol use Yes     Comment: occasionally  . Drug use: No  . Sexual activity: Not on file   Other Topics Concern  . Not on file   Social History Narrative   1 child: lost child 01/2010     Lost brother ~ 53   Lost parents ~  2016   Husband disable: disability 1991, strokes, bipolar, DM, wheelchair bound   Sister lives in [place], extended family spread throughout  [place]      Allergies as of 09/11/2016   No Known Allergies     Medication List       Accurate as of 09/11/16 11:59 PM. Always use your most recent med list.          ALPRAZolam 0.5 MG tablet Commonly known as:  XANAX Take 1 tablet (0.5 mg total) by mouth 3 (three) times daily as needed for anxiety.   aspirin 81 MG tablet Take 81 mg by mouth daily.   benazepril 40 MG tablet Commonly known as:  LOTENSIN Take 1 tablet (40 mg total) by mouth daily.   CALCIUM + D PO Take 1 tablet by mouth daily.   cetirizine 10 MG tablet Commonly known as:  ZYRTEC Take 10 mg by mouth daily.   colchicine 0.6 MG tablet Take 0.6 mg by mouth 2 (two) times daily as needed.   gabapentin 300 MG capsule Commonly known as:  NEURONTIN Take 1 capsule (300 mg total) by mouth at  bedtime.   glimepiride 4 MG tablet Commonly known as:  AMARYL Take 1 tablet (4 mg total) by mouth daily with breakfast.   glucose blood test strip Commonly known as:  ONE TOUCH ULTRA TEST Use as instructed   hydrochlorothiazide 25 MG tablet Commonly known as:  HYDRODIURIL Take 1 tablet (25 mg total) by mouth daily.   indomethacin 50 MG capsule Commonly known as:  INDOCIN Take 50 mg by mouth every 8 (eight) hours as needed. Reported on 08/13/2015   levothyroxine 150 MCG tablet Commonly known as:  SYNTHROID, LEVOTHROID Take 1 tablet (150 mcg total) by mouth daily before breakfast.   metFORMIN 1000 MG tablet Commonly known as:  GLUCOPHAGE Take 1.5 tablets by mouth every morning and 1 tablet by mouth every evening.   metoprolol tartrate 50 MG tablet Commonly known as:  LOPRESSOR Take 0.5 tablets (25 mg total) by mouth 2 (two) times daily.   pioglitazone 30 MG tablet Commonly known as:  ACTOS Take 1 tablet (30 mg total) by mouth daily.   rosuvastatin 10 MG tablet Commonly  known as:  CRESTOR Take 1 tablet (10 mg total) by mouth daily.   sertraline 100 MG tablet Commonly known as:  ZOLOFT Take 2 tablets (200 mg total) by mouth daily.   Vitamin D (Ergocalciferol) 50000 units Caps capsule Commonly known as:  DRISDOL Take 1 capsule (50,000 Units total) by mouth every 7 (seven) days.          Objective:   Physical Exam BP 136/88 (BP Location: Left Arm, Patient Position: Sitting, Cuff Size: Normal)   Pulse (!) 45   Temp (!) 97.5 F (36.4 C) (Oral)   Resp 14   Ht 5\' 5"  (1.651 m)   Wt 278 lb 4 oz (126.2 kg)   SpO2 95%   BMI 46.30 kg/m  General:   Well developed, well nourished . NAD.  HEENT:  Normocephalic . Face symmetric, atraumatic Lungs:  CTA B Normal respiratory effort, no intercostal retractions, no accessory muscle use. Heart: RRR,  no murmur.  No pretibial edema bilaterally  DIABETIC FEET EXAM:  lower extremity  edema B: +/+++ Normal pedal pulses bilaterally Skin : mild hyperpigmentation noted  Pinprick examination of the feet normal. Neurologic:  alert & oriented X3.  Speech normal, gait appropriate for age and unassisted Psych--  Cognition and judgment appear intact.  Cooperative with normal attention span and concentration.  Behavior appropriate. No anxious or depressed appearing.      Assessment & Plan:     Assessment   DM: w/ neuropathy, L toe numbness, foot exam 07-2016 negative HTN Hyperlipidemia Hypothyroidism, h/o goiter Depression- anxiety Edema and LE varicose veins  H/o Bronchospasm DJD GOUT-- dx else where ~ 05-2015, sx decreased w/  colchicine ?RLS-- sx decreased w/ gabapentin (2016) Vit D def  Morbid obesity OSA on CPAP Dr Halford Chessman Bradycardia after anesthesia  PLAN: DM: Last A1c 7.5 (07/2016), metformin dose increased to 1.5 tablet  A.m. and 1 in the afternoon. Also and glimepiride and Actos. Continue same meds, feet exam negative except for edema. Feet care discussed Depression anxiety: Currently on  sertraline 200 mg, has noted a great difference, not on counseling just yet. Vitamin D deficiency: Last vitamin D very low, on ergocalciferol and OTCs. Edema, likely due to varicose veins. Importance of consistent use of compression stockings to prevent skin issues such as ulcers discussed. RTC 4 months.

## 2016-09-12 DIAGNOSIS — R600 Localized edema: Secondary | ICD-10-CM | POA: Insufficient documentation

## 2016-09-12 NOTE — Assessment & Plan Note (Signed)
DM: Last A1c 7.5 (07/2016), metformin dose increased to 1.5 tablet  A.m. and 1 in the afternoon. Also and glimepiride and Actos. Continue same meds, feet exam negative except for edema. Feet care discussed Depression anxiety: Currently on sertraline 200 mg, has noted a great difference, not on counseling just yet. Vitamin D deficiency: Last vitamin D very low, on ergocalciferol and OTCs. Edema, likely due to varicose veins. Importance of consistent use of compression stockings to prevent skin issues such as ulcers discussed. RTC 4 months.

## 2016-09-16 DIAGNOSIS — E785 Hyperlipidemia, unspecified: Secondary | ICD-10-CM | POA: Diagnosis not present

## 2016-09-16 DIAGNOSIS — R079 Chest pain, unspecified: Secondary | ICD-10-CM | POA: Diagnosis not present

## 2016-09-16 DIAGNOSIS — E119 Type 2 diabetes mellitus without complications: Secondary | ICD-10-CM | POA: Diagnosis not present

## 2016-09-16 DIAGNOSIS — R52 Pain, unspecified: Secondary | ICD-10-CM | POA: Diagnosis not present

## 2016-09-16 DIAGNOSIS — I10 Essential (primary) hypertension: Secondary | ICD-10-CM | POA: Diagnosis not present

## 2016-09-16 DIAGNOSIS — M5489 Other dorsalgia: Secondary | ICD-10-CM | POA: Diagnosis not present

## 2016-09-16 DIAGNOSIS — Z79899 Other long term (current) drug therapy: Secondary | ICD-10-CM | POA: Diagnosis not present

## 2016-09-16 DIAGNOSIS — Z7984 Long term (current) use of oral hypoglycemic drugs: Secondary | ICD-10-CM | POA: Diagnosis not present

## 2016-09-16 DIAGNOSIS — R51 Headache: Secondary | ICD-10-CM | POA: Diagnosis not present

## 2016-09-17 DIAGNOSIS — E785 Hyperlipidemia, unspecified: Secondary | ICD-10-CM | POA: Diagnosis not present

## 2016-09-17 DIAGNOSIS — E119 Type 2 diabetes mellitus without complications: Secondary | ICD-10-CM | POA: Diagnosis not present

## 2016-09-17 DIAGNOSIS — I10 Essential (primary) hypertension: Secondary | ICD-10-CM | POA: Diagnosis not present

## 2016-09-17 DIAGNOSIS — R079 Chest pain, unspecified: Secondary | ICD-10-CM | POA: Diagnosis not present

## 2016-09-20 DIAGNOSIS — Z01419 Encounter for gynecological examination (general) (routine) without abnormal findings: Secondary | ICD-10-CM | POA: Diagnosis not present

## 2016-09-26 ENCOUNTER — Ambulatory Visit (HOSPITAL_BASED_OUTPATIENT_CLINIC_OR_DEPARTMENT_OTHER)
Admission: RE | Admit: 2016-09-26 | Discharge: 2016-09-26 | Disposition: A | Discharge status: Home / Self Care | Payer: Medicare HMO | Source: Ambulatory Visit | Attending: Internal Medicine | Admitting: Internal Medicine

## 2016-09-26 ENCOUNTER — Encounter: Payer: Self-pay | Admitting: Internal Medicine

## 2016-09-26 ENCOUNTER — Ambulatory Visit (INDEPENDENT_AMBULATORY_CARE_PROVIDER_SITE_OTHER): Payer: Medicare HMO | Admitting: Internal Medicine

## 2016-09-26 ENCOUNTER — Telehealth: Payer: Self-pay

## 2016-09-26 VITALS — BP 130/78 | HR 59 | Temp 97.8°F | Resp 14 | Ht 65.0 in | Wt 267.5 lb

## 2016-09-26 DIAGNOSIS — M8580 Other specified disorders of bone density and structure, unspecified site: Secondary | ICD-10-CM | POA: Diagnosis not present

## 2016-09-26 DIAGNOSIS — Z780 Asymptomatic menopausal state: Secondary | ICD-10-CM | POA: Diagnosis not present

## 2016-09-26 DIAGNOSIS — R079 Chest pain, unspecified: Secondary | ICD-10-CM | POA: Diagnosis not present

## 2016-09-26 DIAGNOSIS — M85852 Other specified disorders of bone density and structure, left thigh: Secondary | ICD-10-CM | POA: Diagnosis not present

## 2016-09-26 DIAGNOSIS — B349 Viral infection, unspecified: Secondary | ICD-10-CM

## 2016-09-26 DIAGNOSIS — R0602 Shortness of breath: Secondary | ICD-10-CM | POA: Diagnosis not present

## 2016-09-26 LAB — COMPREHENSIVE METABOLIC PANEL
ALBUMIN: 3.7 g/dL (ref 3.5–5.2)
ALK PHOS: 79 U/L (ref 39–117)
ALT: 15 U/L (ref 0–35)
AST: 12 U/L (ref 0–37)
BILIRUBIN TOTAL: 0.3 mg/dL (ref 0.2–1.2)
BUN: 36 mg/dL — ABNORMAL HIGH (ref 6–23)
CALCIUM: 9.2 mg/dL (ref 8.4–10.5)
CO2: 25 mEq/L (ref 19–32)
Chloride: 105 mEq/L (ref 96–112)
Creatinine, Ser: 1.22 mg/dL — ABNORMAL HIGH (ref 0.40–1.20)
GFR: 46.69 mL/min — AB (ref >60.00)
GLUCOSE: 173 mg/dL — AB (ref 70–99)
Potassium: 4.1 mEq/L (ref 3.5–5.1)
Sodium: 138 mEq/L (ref 135–145)
TOTAL PROTEIN: 6.9 g/dL (ref 6.0–8.3)

## 2016-09-26 LAB — CBC WITH DIFFERENTIAL/PLATELET
BASOS ABS: 0 10*3/uL (ref 0.0–0.1)
Basophils Relative: 0.4 % (ref 0.0–3.0)
Eosinophils Absolute: 0.2 10*3/uL (ref 0.0–0.7)
Eosinophils Relative: 3.1 % (ref 0.0–5.0)
HCT: 40.6 % (ref 36.0–46.0)
Hemoglobin: 13 g/dL (ref 12.0–15.0)
LYMPHS ABS: 1.3 10*3/uL (ref 0.7–4.0)
Lymphocytes Relative: 21.5 % (ref 12.0–46.0)
MCHC: 31.9 g/dL (ref 30.0–36.0)
MCV: 90.5 fl (ref 78.0–100.0)
MONO ABS: 0.4 10*3/uL (ref 0.1–1.0)
MONOS PCT: 6.3 % (ref 3.0–12.0)
NEUTROS PCT: 68.7 % (ref 43.0–77.0)
Neutro Abs: 4.2 10*3/uL (ref 1.4–7.7)
Platelets: 318 10*3/uL (ref 150.0–400.0)
RBC: 4.48 Mil/uL (ref 3.87–5.11)
RDW: 16 % — ABNORMAL HIGH (ref 11.5–15.5)
WBC: 6.1 10*3/uL (ref 4.0–10.5)

## 2016-09-26 LAB — SEDIMENTATION RATE: SED RATE: 44 mm/h — AB (ref 0–30)

## 2016-09-26 MED ORDER — INDOMETHACIN 50 MG PO CAPS: 50.0000 mg | ORAL_CAPSULE | Freq: Three times a day (TID) | ORAL | 0 refills | Status: DC | PRN

## 2016-09-26 MED ORDER — COLCHICINE 0.6 MG PO TABS: 0.6000 mg | ORAL_TABLET | Freq: Two times a day (BID) | ORAL | 0 refills | Status: AC | PRN

## 2016-09-26 NOTE — Telephone Encounter (Signed)
Received medical records, forwarded to PCP.

## 2016-09-26 NOTE — Progress Notes (Signed)
Pre visit review using our clinic review tool, if applicable. No additional management support is needed unless otherwise documented below in the visit note. 

## 2016-09-26 NOTE — Patient Instructions (Signed)
GO TO THE LAB : Get the blood work     GO TO THE FRONT DESK Schedule your next appointment for a   routine checkup by November 2018  STOP BY THE FIRST FLOOR:  get the XR

## 2016-09-26 NOTE — Progress Notes (Signed)
Subjective:    Patient ID: [name], female    DOB: 1950-04-14, 66 y.o.   MRN: [id number]  DOS:  09/26/2016 Type of visit - description : Hospital follow-up Interval history: Started to feel unwell 09/15/2016: Headache, shoulder aches, took OTCs. No fever chills. The next day, she felt short of breath, developed chest pain, as more intense headache and muscle aches. Went to an outside [facility] in [place], workup was done,  DX  viral syndrome and possible pericarditis. Was admitted for approximately 24 hours, felt better and was discharged on prednisone, colchicine, Indocin and pantoprazole.   Review of Systems Currently feeling better. She finished prednisone, colchicine and indomethacin. Still on pantoprazole. Shortness of breath is much improved and now minimal. She still has mild dull headache. No chest pain, fever or chills. Had lower extremity edema but that has improved. Continue with mild cough.   Past Medical History:  Diagnosis Date  . Abnormal nerve conduction studies 08/2015   moderate R carpal tunnel syndrome   . Bradycardia   . Complication of anesthesia    HR dropped to 20  . Depression   . Diabetes mellitus    has diarrhea from medicine  . Dyslipidemia   . Goiter    h/o  . Hypertension   . Hypothyroidism    w/ h/o goiter  . Morbid obesity ([employer])   . Osteoarthritis   . PONV (postoperative nausea and vomiting)   . Seasonal allergies   . Sleep apnea    on CPAP    Past Surgical History:  Procedure Laterality Date  . APPENDECTOMY  1970's  . CARPAL TUNNEL RELEASE Right 01/2016  . COLONOSCOPY    . KNEE ARTHROSCOPY W/ MENISCECTOMY Right 2007  . TOTAL KNEE ARTHROPLASTY  08/05/07   right  . TOTAL KNEE ARTHROPLASTY Left 07/01/2013   Procedure: LEFT TOTAL KNEE ARTHROPLASTY;  Surgeon: Mauri Pole, MD;  Location: [facility];  Service: Orthopedics;  Laterality: Left;    Social History   Social History  . Marital status: Married    Spouse name: N/A  . Number  of children: 1  . Years of education: N/A   Occupational History  . retired 01-2014    Social History Main Topics  . Smoking status: Former Smoker    Packs/day: 0.50    Years: 10.00    Types: Cigarettes    Quit date: 02/28/1980  . Smokeless tobacco: Never Used  . Alcohol use Yes     Comment: occasionally  . Drug use: No  . Sexual activity: Not on file   Other Topics Concern  . Not on file   Social History Narrative   1 child: lost child 01/2010     Lost brother ~ 59   Lost parents ~ 2016   Husband disable: disability 1991, strokes, bipolar, DM, wheelchair bound   Sister lives in [place], extended family spread throughout  [place]      Allergies as of 09/26/2016   No Known Allergies     Medication List       Accurate as of 09/26/16 12:02 PM. Always use your most recent med list.          ALPRAZolam 0.5 MG tablet Commonly known as:  XANAX Take 1 tablet (0.5 mg total) by mouth 3 (three) times daily as needed for anxiety.   aspirin 81 MG tablet Take 81 mg by mouth daily.   benazepril 40 MG tablet Commonly known as:  LOTENSIN Take 1 tablet (40  mg total) by mouth daily.   CALCIUM + D PO Take 1 tablet by mouth daily.   cetirizine 10 MG tablet Commonly known as:  ZYRTEC Take 10 mg by mouth daily.   colchicine 0.6 MG tablet Take 1 tablet (0.6 mg total) by mouth 2 (two) times daily as needed.   gabapentin 300 MG capsule Commonly known as:  NEURONTIN Take 1 capsule (300 mg total) by mouth at bedtime.   glimepiride 4 MG tablet Commonly known as:  AMARYL Take 1 tablet (4 mg total) by mouth daily with breakfast.   glucose blood test strip Commonly known as:  ONE TOUCH ULTRA TEST Use as instructed   hydrochlorothiazide 25 MG tablet Commonly known as:  HYDRODIURIL Take 1 tablet (25 mg total) by mouth daily.   indomethacin 50 MG capsule Commonly known as:  INDOCIN Take 1 capsule (50 mg total) by mouth every 8 (eight) hours as needed. Reported on 08/13/2015     levothyroxine 150 MCG tablet Commonly known as:  SYNTHROID, LEVOTHROID Take 1 tablet (150 mcg total) by mouth daily before breakfast.   metFORMIN 1000 MG tablet Commonly known as:  GLUCOPHAGE Take 1.5 tablets by mouth every morning and 1 tablet by mouth every evening.   metoprolol tartrate 50 MG tablet Commonly known as:  LOPRESSOR Take 0.5 tablets (25 mg total) by mouth 2 (two) times daily.   pantoprazole 40 MG tablet Commonly known as:  PROTONIX Take 40 mg by mouth daily.   pioglitazone 30 MG tablet Commonly known as:  ACTOS Take 1 tablet (30 mg total) by mouth daily.   rosuvastatin 10 MG tablet Commonly known as:  CRESTOR Take 1 tablet (10 mg total) by mouth daily.   sertraline 100 MG tablet Commonly known as:  ZOLOFT Take 2 tablets (200 mg total) by mouth daily.   Vitamin D (Ergocalciferol) 50000 units Caps capsule Commonly known as:  DRISDOL Take 1 capsule (50,000 Units total) by mouth every 7 (seven) days.          Objective:   Physical Exam BP 130/78 (BP Location: Right Arm, Patient Position: Sitting, Cuff Size: Normal)   Pulse (!) 59   Temp 97.8 F (36.6 C) (Oral)   Resp 14   Ht 5\' 5"  (1.651 m)   Wt 267 lb 8 oz (121.3 kg)   SpO2 97%   BMI 44.51 kg/m  General:   Well developed, well nourished . NAD. VSS HEENT:  Normocephalic . Face symmetric, atraumatic Lungs:  CTA B Normal respiratory effort, no intercostal retractions, no accessory muscle use. Heart: RRR,  no murmur. No rub Trace pretibial edema bilaterally  Abdomen:  Not distended, soft, non-tender. No rebound or rigidity.  Skin: Not pale. Not jaundice Neurologic:  alert & oriented X3.  Speech normal, gait appropriate for age and unassisted Psych--  Cognition and judgment appear intact.  Cooperative with normal attention span and concentration.  Behavior appropriate. No anxious or depressed appearing.    Assessment & Plan:   Assessment   DM: w/ neuropathy, L toe numbness, foot exam  07-2016 negative HTN Hyperlipidemia Hypothyroidism, h/o goiter Depression- anxiety Edema and LE varicose veins  H/o Bronchospasm DJD GOUT-- dx else where ~ 05-2015, sx decreased w/  colchicine ?RLS-- sx decreased w/ gabapentin (2016) Vit D def  Morbid obesity OSA on CPAP Dr Halford Chessman Bradycardia after anesthesia  PLAN: Viral syndrome: Admitted to an outside hospital with shortness of breath, headache, chest pain, workup dong, DX with a viral syndrome and apparently a  pericardial effusion. Finished prednisone, colchicine and indomethacin. Feels better. Will get records Check a CMP, CBC, sedimentation rate and a chest x-ray. Depending on results may need further evaluation, hospital team suggested possibly a cardiology referral as an outpatient. Addendum: Records reviewed, see below. Discharge summary suggest that they suspected pericarditis due to pleurisy type of CP and trace pericardial effusion on echocardiogram Records. Admitted 09/16/2016 Workup included a CTA chest with no evidence of emboli or dissection. EKG nonacute. Troponin is negative. CT head negative Echocardiogram: EF 55-60%, trivial pericardial effusion Labs: White count 7.3, hemoglobin 10.5, platelets 164. Last potassium 4.1, creatinine 1.5. DM: Last A1c elevated, metformin increased, good compliance.  RTC 12-2016, routine checkup

## 2016-09-26 NOTE — Telephone Encounter (Signed)
ROI faxed to [facility] at [phone]. ROI sent for scanning.

## 2016-09-27 NOTE — Telephone Encounter (Signed)
Records reviewed by PCP, and sent for scanning.

## 2016-09-27 NOTE — Assessment & Plan Note (Signed)
Viral syndrome: Admitted to an outside hospital with shortness of breath, headache, chest pain, workup dong, DX with a viral syndrome and apparently a pericardial effusion. Finished prednisone, colchicine and indomethacin. Feels better. Will get records Check a CMP, CBC, sedimentation rate and a chest x-ray. Depending on results may need further evaluation, hospital team suggested possibly a cardiology referral as an outpatient. Addendum: Records reviewed, see below. Discharge summary suggest that they suspected pericarditis due to pleurisy type of CP and trace pericardial effusion on echocardiogram Records. Admitted 09/16/2016 Workup included a CTA chest with no evidence of emboli or dissection. EKG nonacute. Troponin is negative. CT head negative Echocardiogram: EF 55-60%, trivial pericardial effusion Labs: White count 7.3, hemoglobin 10.5, platelets 164. Last potassium 4.1, creatinine 1.5. DM: Last A1c elevated, metformin increased, good compliance.  RTC 12-2016, routine checkup

## 2016-09-27 NOTE — Telephone Encounter (Signed)
Review them. See visit note

## 2016-09-28 ENCOUNTER — Other Ambulatory Visit: Payer: Self-pay

## 2016-09-28 DIAGNOSIS — R079 Chest pain, unspecified: Secondary | ICD-10-CM

## 2016-10-10 ENCOUNTER — Other Ambulatory Visit (INDEPENDENT_AMBULATORY_CARE_PROVIDER_SITE_OTHER): Payer: Medicare HMO

## 2016-10-10 DIAGNOSIS — R079 Chest pain, unspecified: Secondary | ICD-10-CM | POA: Diagnosis not present

## 2016-10-10 LAB — SEDIMENTATION RATE: SED RATE: 35 mm/h — AB (ref 0–30)

## 2016-10-24 DIAGNOSIS — Z008 Encounter for other general examination: Secondary | ICD-10-CM | POA: Diagnosis not present

## 2016-10-24 DIAGNOSIS — M545 Low back pain: Secondary | ICD-10-CM | POA: Diagnosis not present

## 2016-11-01 ENCOUNTER — Telehealth: Payer: Self-pay | Admitting: Internal Medicine

## 2016-11-01 MED ORDER — GLIMEPIRIDE 4 MG PO TABS: 4.0000 mg | ORAL_TABLET | Freq: Every day | ORAL | 1 refills | Status: DC

## 2016-11-01 MED ORDER — METOPROLOL TARTRATE 50 MG PO TABS: 25.0000 mg | ORAL_TABLET | Freq: Two times a day (BID) | ORAL | 1 refills | Status: DC

## 2016-11-01 MED ORDER — PIOGLITAZONE HCL 30 MG PO TABS: 30.0000 mg | ORAL_TABLET | Freq: Every day | ORAL | 1 refills | Status: DC

## 2016-11-01 MED ORDER — SERTRALINE HCL 100 MG PO TABS: 200.0000 mg | ORAL_TABLET | Freq: Every day | ORAL | 1 refills | Status: DC

## 2016-11-01 MED ORDER — METFORMIN HCL 1000 MG PO TABS: ORAL_TABLET | ORAL | 1 refills | Status: DC

## 2016-11-01 NOTE — Telephone Encounter (Signed)
Requested Rx's sent to [facility].  

## 2016-11-01 NOTE — Telephone Encounter (Signed)
Relation to BE:MLJQ Call back number: [phone] Pharmacy: [id number] - [place], [phone] (Phone) [phone] (Fax)     Reason for call:  Patient requesting a 90 day supply metoprolol (LOPRESSOR) 50 MG tablet, metFORMIN (GLUCOPHAGE) 1000 MG tablet, sertraline (ZOLOFT) 100 MG tablet, pioglitazone (ACTOS) 30 MG tablet, glimepiride (AMARYL) 4 MG tablet

## 2016-11-07 ENCOUNTER — Telehealth: Payer: Self-pay

## 2016-11-07 ENCOUNTER — Ambulatory Visit (INDEPENDENT_AMBULATORY_CARE_PROVIDER_SITE_OTHER): Payer: Medicare HMO | Admitting: Internal Medicine

## 2016-11-07 ENCOUNTER — Encounter: Payer: Self-pay | Admitting: Internal Medicine

## 2016-11-07 VITALS — BP 128/74 | HR 65 | Temp 97.6°F | Resp 14 | Ht 65.0 in | Wt 274.1 lb

## 2016-11-07 DIAGNOSIS — M5440 Lumbago with sciatica, unspecified side: Secondary | ICD-10-CM

## 2016-11-07 MED ORDER — PREDNISONE 10 MG PO TABS: ORAL_TABLET | ORAL | 0 refills | Status: DC

## 2016-11-07 MED ORDER — KETOROLAC TROMETHAMINE 10 MG PO TABS: 10.0000 mg | ORAL_TABLET | Freq: Four times a day (QID) | ORAL | 0 refills | Status: DC | PRN

## 2016-11-07 MED ORDER — CYCLOBENZAPRINE HCL 10 MG PO TABS: 10.0000 mg | ORAL_TABLET | Freq: Every evening | ORAL | 0 refills | Status: AC | PRN

## 2016-11-07 NOTE — Patient Instructions (Signed)
Use a heating pad  Prednisone as prescribed  Stop prednisone if your blood sugars are consistently more than 200. Check blood sugars daily  For pain take Tylenol 500 mg: 2 tablets every 8 hours as needed  You can also use Toradol if the pain continue. Do not mix with Motrin or indomethacin.  Take Flexeril, a muscle relaxant at nighttime. Will cause drowsiness  Call if not better in few days, or if you have fever, chills or a rash.

## 2016-11-07 NOTE — Telephone Encounter (Signed)
PA approved. Effective 02/26/2016 through 02/26/2017. Referral number: [id number].

## 2016-11-07 NOTE — Telephone Encounter (Signed)
PA approved. Effective 02/26/2016 through 02/26/2017. Referral number: [id number].

## 2016-11-07 NOTE — Telephone Encounter (Signed)
PA initiated via Covermymeds; KEY: [id number]. Awaiting determination.

## 2016-11-07 NOTE — Progress Notes (Signed)
Subjective:    Patient ID: [name], female    DOB: Apr 03, 1950, 66 y.o.   MRN: [id number]  DOS:  11/07/2016 Type of visit - description : acute Interval history: Developed pain at the left buttock at some point last week. The pain is steady, worse with certain movements or if she sits down. The pain also radiates to the back of the thigh and even her calf. The calf pain is steady most nights. Has taken some Tylenol and Aleve without much help. Does not remember any injury.   Review of Systems  No fever chills No lower extremity numbness. No rash anywhere on her back or leg. No bladder or bowel incontinence.  Past Medical History:  Diagnosis Date  . Abnormal nerve conduction studies 08/2015   moderate R carpal tunnel syndrome   . Bradycardia   . Complication of anesthesia    HR dropped to 20  . Depression   . Diabetes mellitus    has diarrhea from medicine  . Dyslipidemia   . Goiter    h/o  . Hypertension   . Hypothyroidism    w/ h/o goiter  . Morbid obesity ([place])   . Osteoarthritis   . PONV (postoperative nausea and vomiting)   . Seasonal allergies   . Sleep apnea    on CPAP    Past Surgical History:  Procedure Laterality Date  . APPENDECTOMY  1970's  . CARPAL TUNNEL RELEASE Right 01/2016  . COLONOSCOPY    . KNEE ARTHROSCOPY W/ MENISCECTOMY Right 2007  . TOTAL KNEE ARTHROPLASTY  08/05/07   right  . TOTAL KNEE ARTHROPLASTY Left 07/01/2013   Procedure: LEFT TOTAL KNEE ARTHROPLASTY;  Surgeon: Mauri Pole, MD;  Location: [facility];  Service: Orthopedics;  Laterality: Left;    Social History   Social History  . Marital status: Married    Spouse name: N/A  . Number of children: 1  . Years of education: N/A   Occupational History  . retired 01-2014    Social History Main Topics  . Smoking status: Former Smoker    Packs/day: 0.50    Years: 10.00    Types: Cigarettes    Quit date: 02/28/1980  . Smokeless tobacco: Never Used  . Alcohol use Yes   Comment: occasionally  . Drug use: No  . Sexual activity: Not on file   Other Topics Concern  . Not on file   Social History Narrative   1 child: lost child 01/2010     Lost brother ~ 65   Lost parents ~ 2016   Husband disable: disability 1991, strokes, bipolar, DM, wheelchair bound   Sister lives in [place], extended family spread throughout  [place]      Allergies as of 11/07/2016   No Known Allergies     Medication List       Accurate as of 11/07/16 11:59 PM. Always use your most recent med list.          ALPRAZolam 0.5 MG tablet Commonly known as:  XANAX Take 1 tablet (0.5 mg total) by mouth 3 (three) times daily as needed for anxiety.   aspirin 81 MG tablet Take 81 mg by mouth daily.   benazepril 40 MG tablet Commonly known as:  LOTENSIN Take 1 tablet (40 mg total) by mouth daily.   CALCIUM + D PO Take 1 tablet by mouth daily.   cetirizine 10 MG tablet Commonly known as:  ZYRTEC Take 10 mg by mouth daily.  colchicine 0.6 MG tablet Take 1 tablet (0.6 mg total) by mouth 2 (two) times daily as needed.   cyclobenzaprine 10 MG tablet Commonly known as:  FLEXERIL Take 1 tablet (10 mg total) by mouth at bedtime as needed for muscle spasms.   gabapentin 300 MG capsule Commonly known as:  NEURONTIN Take 1 capsule (300 mg total) by mouth at bedtime.   glimepiride 4 MG tablet Commonly known as:  AMARYL Take 1 tablet (4 mg total) by mouth daily with breakfast.   glucose blood test strip Commonly known as:  ONE TOUCH ULTRA TEST Use as instructed   hydrochlorothiazide 25 MG tablet Commonly known as:  HYDRODIURIL Take 1 tablet (25 mg total) by mouth daily.   indomethacin 50 MG capsule Commonly known as:  INDOCIN Take 1 capsule (50 mg total) by mouth every 8 (eight) hours as needed. Reported on 08/13/2015   ketorolac 10 MG tablet Commonly known as:  TORADOL Take 1 tablet (10 mg total) by mouth every 6 (six) hours as needed.   levothyroxine 150 MCG  tablet Commonly known as:  SYNTHROID, LEVOTHROID Take 1 tablet (150 mcg total) by mouth daily before breakfast.   metFORMIN 1000 MG tablet Commonly known as:  GLUCOPHAGE Take 1.5 tablets by mouth every morning and 1 tablet by mouth every evening.   metoprolol tartrate 50 MG tablet Commonly known as:  LOPRESSOR Take 0.5 tablets (25 mg total) by mouth 2 (two) times daily.   pantoprazole 40 MG tablet Commonly known as:  PROTONIX Take 40 mg by mouth daily.   pioglitazone 30 MG tablet Commonly known as:  ACTOS Take 1 tablet (30 mg total) by mouth daily.   predniSONE 10 MG tablet Commonly known as:  DELTASONE 4 tablets x 2 days, 3 tabs x 2 days, 2 tabs x 2 days, 1 tab x 2 days   rosuvastatin 10 MG tablet Commonly known as:  CRESTOR Take 1 tablet (10 mg total) by mouth daily.   sertraline 100 MG tablet Commonly known as:  ZOLOFT Take 2 tablets (200 mg total) by mouth daily.   Vitamin D (Ergocalciferol) 50000 units Caps capsule Commonly known as:  DRISDOL Take 1 capsule (50,000 Units total) by mouth every 7 (seven) days.            Discharge Care Instructions        Start     Ordered   11/07/16 0000  ketorolac (TORADOL) 10 MG tablet  Every 6 hours PRN     11/07/16 1156   11/07/16 0000  cyclobenzaprine (FLEXERIL) 10 MG tablet  At bedtime PRN     11/07/16 1156   11/07/16 0000  predniSONE (DELTASONE) 10 MG tablet     11/07/16 1156         Objective:   Physical Exam BP 128/74 (BP Location: Left Arm, Patient Position: Sitting, Cuff Size: Normal)   Pulse 65   Temp 97.6 F (36.4 C) (Oral)   Resp 14   Ht 5\' 5"  (1.651 m)   Wt 274 lb 2 oz (124.3 kg)   SpO2 97%   BMI 45.62 kg/m  General:   Well developed, morbidly obese lady, + antalgic posture and gait.  HEENT:  Normocephalic . Face symmetric, atraumatic  Skin: Not pale. Not jaundice. No rash at the back or lower extremities Neurologic:  alert & oriented X3.  Speech normal, gait antalgic but unassisted, did  demonstrate pain when she tried to get up from the examining table. DTRs symmetric. Straight  leg test negative. MSK: No TTP at the lumbar or sacroiliac areas. Psych--  Cognition and judgment appear intact.  Cooperative with normal attention span and concentration.  Behavior appropriate. No anxious or depressed appearing.      Assessment & Plan:  Assessment   DM: w/ neuropathy, L toe numbness, foot exam 07-2016 negative HTN Hyperlipidemia Hypothyroidism, h/o goiter Depression- anxiety Edema and LE varicose veins  H/o Bronchospasm DJD GOUT-- dx else where ~ 05-2015, sx decreased w/  colchicine ?RLS-- sx decreased w/ gabapentin (2016) Vit D def  Morbid obesity OSA on CPAP Dr Halford Chessman Bradycardia after anesthesia  PLAN: Back pain with radicular features: Started a few days ago, neurological exam is symmetric, no fever or chills. Will recommend treatment with prednisone, Tylenol, Toradol. See instructions. Also Flexeril, a muscle relaxant at night. Will call if no better. Watch for elevated CBGs.

## 2016-11-07 NOTE — Telephone Encounter (Signed)
Pt's insurance called in to get a Dx for medication. She said that medication is approved. She will fax over to PCP.

## 2016-11-07 NOTE — Telephone Encounter (Signed)
PA initiated via Covermymeds; KEY: [id number]. Awaiting determination.

## 2016-11-07 NOTE — Progress Notes (Signed)
Pre visit review using our clinic review tool, if applicable. No additional management support is needed unless otherwise documented below in the visit note. 

## 2016-11-08 NOTE — Assessment & Plan Note (Signed)
Back pain with radicular features: Started a few days ago, neurological exam is symmetric, no fever or chills. Will recommend treatment with prednisone, Tylenol, Toradol. See instructions. Also Flexeril, a muscle relaxant at night. Will call if no better. Watch for elevated CBGs.

## 2016-12-02 ENCOUNTER — Other Ambulatory Visit: Payer: Self-pay | Admitting: Internal Medicine

## 2016-12-07 ENCOUNTER — Other Ambulatory Visit: Payer: Self-pay | Admitting: Internal Medicine

## 2016-12-22 ENCOUNTER — Encounter: Payer: Self-pay | Admitting: Internal Medicine

## 2016-12-22 ENCOUNTER — Ambulatory Visit (HOSPITAL_BASED_OUTPATIENT_CLINIC_OR_DEPARTMENT_OTHER)
Admission: RE | Admit: 2016-12-22 | Discharge: 2016-12-22 | Disposition: A | Discharge status: Home / Self Care | Payer: Medicare HMO | Source: Ambulatory Visit | Attending: Internal Medicine | Admitting: Internal Medicine

## 2016-12-22 ENCOUNTER — Ambulatory Visit (INDEPENDENT_AMBULATORY_CARE_PROVIDER_SITE_OTHER): Payer: Medicare HMO | Admitting: Internal Medicine

## 2016-12-22 VITALS — BP 126/74 | HR 48 | Temp 97.8°F | Resp 14 | Ht 65.0 in | Wt 271.1 lb

## 2016-12-22 DIAGNOSIS — M19042 Primary osteoarthritis, left hand: Secondary | ICD-10-CM | POA: Diagnosis not present

## 2016-12-22 DIAGNOSIS — M7989 Other specified soft tissue disorders: Secondary | ICD-10-CM | POA: Diagnosis not present

## 2016-12-22 DIAGNOSIS — S6992XA Unspecified injury of left wrist, hand and finger(s), initial encounter: Secondary | ICD-10-CM | POA: Diagnosis not present

## 2016-12-22 MED ORDER — ALPRAZOLAM 0.5 MG PO TABS: 0.5000 mg | ORAL_TABLET | Freq: Three times a day (TID) | ORAL | 1 refills | Status: DC | PRN

## 2016-12-22 NOTE — Patient Instructions (Signed)
  STOP BY THE FIRST FLOOR:  get the XR   ICE twice a day  For few days: IBUPROFEN (Advil or Motrin) 200 mg 2 tablets every 8 hours as needed for pain.  Always take it with food because may cause gastritis and ulcers.  If you notice nausea, stomach pain, change in the color of stools --->  Stop the medicine and let us know  Or Tylenol  500 mg OTC 2 tabs a day every 8 hours as needed for pain

## 2016-12-22 NOTE — Progress Notes (Signed)
Subjective:    Patient ID: [name], female    DOB: 01/24/1951, 66 y.o.   MRN: [id number]  DOS:  12/22/2016 Type of visit - description : acute Interval history: Was helping a neighbor change a tire 2 days ago, that night, she developed pain and swelling at the left  hand dorsum.  The area was tender to touch but not red or hot.  Anxiety: Needs a refill of Xanax  Review of Systems No fever chills  Past Medical History:  Diagnosis Date  . Abnormal nerve conduction studies 08/2015   moderate R carpal tunnel syndrome   . Bradycardia   . Complication of anesthesia    HR dropped to 20  . Depression   . Diabetes mellitus    has diarrhea from medicine  . Dyslipidemia   . Goiter    h/o  . Hypertension   . Hypothyroidism    w/ h/o goiter  . Morbid obesity ([place])   . Osteoarthritis   . PONV (postoperative nausea and vomiting)   . Seasonal allergies   . Sleep apnea    on CPAP    Past Surgical History:  Procedure Laterality Date  . APPENDECTOMY  1970's  . CARPAL TUNNEL RELEASE Right 01/2016  . COLONOSCOPY    . KNEE ARTHROSCOPY W/ MENISCECTOMY Right 2007  . TOTAL KNEE ARTHROPLASTY  08/05/07   right  . TOTAL KNEE ARTHROPLASTY Left 07/01/2013   Procedure: LEFT TOTAL KNEE ARTHROPLASTY;  Surgeon: Mauri Pole, MD;  Location: [facility];  Service: Orthopedics;  Laterality: Left;    Social History   Social History  . Marital status: Married    Spouse name: N/A  . Number of children: 1  . Years of education: N/A   Occupational History  . retired 01-2014    Social History Main Topics  . Smoking status: Former Smoker    Packs/day: 0.50    Years: 10.00    Types: Cigarettes    Quit date: 02/28/1980  . Smokeless tobacco: Never Used  . Alcohol use Yes     Comment: occasionally  . Drug use: No  . Sexual activity: Not on file   Other Topics Concern  . Not on file   Social History Narrative   1 child: lost child 01/2010     Lost brother ~ 68   Lost parents ~ 2016   Husband disable: disability 1991, strokes, bipolar, DM, wheelchair bound   Sister lives in [place], extended family spread throughout  [place]      Allergies as of 12/22/2016   No Known Allergies     Medication List       Accurate as of 12/22/16 11:59 PM. Always use your most recent med list.          ALPRAZolam 0.5 MG tablet Commonly known as:  XANAX Take 1 tablet (0.5 mg total) by mouth 3 (three) times daily as needed for anxiety.   aspirin 81 MG tablet Take 81 mg by mouth daily.   benazepril 40 MG tablet Commonly known as:  LOTENSIN Take 1 tablet (40 mg total) by mouth daily.   CALCIUM + D PO Take 1 tablet by mouth daily.   cetirizine 10 MG tablet Commonly known as:  ZYRTEC Take 10 mg by mouth daily.   colchicine 0.6 MG tablet Take 1 tablet (0.6 mg total) by mouth 2 (two) times daily as needed.   cyclobenzaprine 10 MG tablet Commonly known as:  FLEXERIL Take 1 tablet (10 mg  total) by mouth at bedtime as needed for muscle spasms.   gabapentin 300 MG capsule Commonly known as:  NEURONTIN Take 1 capsule (300 mg total) by mouth at bedtime.   glimepiride 4 MG tablet Commonly known as:  AMARYL Take 1 tablet (4 mg total) by mouth daily with breakfast.   glucose blood test strip Commonly known as:  ONE TOUCH ULTRA TEST Use as instructed   hydrochlorothiazide 25 MG tablet Commonly known as:  HYDRODIURIL Take 1 tablet (25 mg total) by mouth daily.   indomethacin 50 MG capsule Commonly known as:  INDOCIN Take 1 capsule (50 mg total) by mouth every 8 (eight) hours as needed. Reported on 08/13/2015   levothyroxine 150 MCG tablet Commonly known as:  SYNTHROID, LEVOTHROID Take 1 tablet (150 mcg total) by mouth daily before breakfast.   metFORMIN 1000 MG tablet Commonly known as:  GLUCOPHAGE Take 1.5 tablets by mouth every morning and 1 tablet by mouth every evening.   metoprolol tartrate 50 MG tablet Commonly known as:  LOPRESSOR Take 0.5 tablets (25 mg total)  by mouth 2 (two) times daily.   pantoprazole 40 MG tablet Commonly known as:  PROTONIX Take 40 mg by mouth daily.   pioglitazone 30 MG tablet Commonly known as:  ACTOS Take 1 tablet (30 mg total) by mouth daily.   rosuvastatin 10 MG tablet Commonly known as:  CRESTOR Take 1 tablet (10 mg total) by mouth daily.   sertraline 100 MG tablet Commonly known as:  ZOLOFT Take 2 tablets (200 mg total) by mouth daily.   Vitamin D (Ergocalciferol) 50000 units Caps capsule Commonly known as:  DRISDOL Take 1 capsule (50,000 Units total) by mouth every 7 (seven) days.          Objective:   Physical Exam  Musculoskeletal:       Hands:  BP 126/74 (BP Location: Right Arm, Patient Position: Sitting, Cuff Size: Normal)   Pulse (!) 48   Temp 97.8 F (36.6 C) (Oral)   Resp 14   Ht 5\' 5"  (1.651 m)   Wt 271 lb 2 oz (123 kg)   SpO2 98%   BMI 45.12 kg/m  General:   Well developed, well nourished . NAD.  HEENT:  Normocephalic . Face symmetric, atraumatic MSK: Right hand normal Left wrist: Normal Left hand: Slightly swollen, TTP at the dorsum proximal from the fourth and fifth finger.  See graphic. Skin: Not pale. Not jaundice Neurologic:  alert & oriented X3.  Speech normal, gait appropriate for age and unassisted Psych--  Cognition and judgment appear intact.  Cooperative with normal attention span and concentration.  Behavior appropriate. No anxious or depressed appearing.      Assessment & Plan:    Assessment   DM: w/ neuropathy, L toe numbness, foot exam 07-2016 negative HTN Hyperlipidemia Hypothyroidism, h/o goiter Depression- anxiety Edema and LE varicose veins  H/o Bronchospasm DJD GOUT-- dx else where ~ 05-2015, sx decreased w/  colchicine ?RLS-- sx decreased w/ gabapentin (2016) Vit D def  Morbid obesity OSA on CPAP Dr Halford Chessman Bradycardia after anesthesia  PLAN: Injury, L hand: sx started after she changed a tire; could be a sprain, tendinitis or possibly  fracture.  Will get a x-ray, otherwise conservative treatment with ice, ibuprofen or Tylenol.  If she has a fracture, will need a brace and a referral Anxiety: Refill Xanax  Has a scheduled appointment for routine checkup next month

## 2016-12-22 NOTE — Progress Notes (Signed)
Pre visit review using our clinic review tool, if applicable. No additional management support is needed unless otherwise documented below in the visit note. 

## 2016-12-24 NOTE — Assessment & Plan Note (Signed)
Injury, L hand: sx started after she changed a tire; could be a sprain, tendinitis or possibly fracture.  Will get a x-ray, otherwise conservative treatment with ice, ibuprofen or Tylenol.  If she has a fracture, will need a brace and a referral Anxiety: Refill Xanax  Has a scheduled appointment for routine checkup next month

## 2016-12-28 LAB — HM DIABETES EYE EXAM

## 2017-01-12 ENCOUNTER — Encounter: Payer: Self-pay | Admitting: Internal Medicine

## 2017-01-12 ENCOUNTER — Ambulatory Visit: Payer: Medicare HMO | Admitting: Internal Medicine

## 2017-01-12 VITALS — BP 132/68 | HR 58 | Temp 98.2°F | Resp 14 | Ht 65.0 in | Wt 272.5 lb

## 2017-01-12 DIAGNOSIS — E034 Atrophy of thyroid (acquired): Secondary | ICD-10-CM | POA: Diagnosis not present

## 2017-01-12 DIAGNOSIS — H52223 Regular astigmatism, bilateral: Secondary | ICD-10-CM | POA: Diagnosis not present

## 2017-01-12 DIAGNOSIS — S6992XD Unspecified injury of left wrist, hand and finger(s), subsequent encounter: Secondary | ICD-10-CM

## 2017-01-12 DIAGNOSIS — I10 Essential (primary) hypertension: Secondary | ICD-10-CM | POA: Diagnosis not present

## 2017-01-12 DIAGNOSIS — E119 Type 2 diabetes mellitus without complications: Secondary | ICD-10-CM

## 2017-01-12 DIAGNOSIS — Z23 Encounter for immunization: Secondary | ICD-10-CM | POA: Diagnosis not present

## 2017-01-12 LAB — BASIC METABOLIC PANEL
BUN: 26 mg/dL — AB (ref 6–23)
CO2: 22 meq/L (ref 19–32)
Calcium: 9.5 mg/dL (ref 8.4–10.5)
Chloride: 108 mEq/L (ref 96–112)
Creatinine, Ser: 1.17 mg/dL (ref 0.40–1.20)
GFR: 48.96 mL/min — AB (ref >60.00)
GLUCOSE: 192 mg/dL — AB (ref 70–99)
POTASSIUM: 4.2 meq/L (ref 3.5–5.1)
SODIUM: 139 meq/L (ref 135–145)

## 2017-01-12 LAB — TSH: TSH: 0.53 u[IU]/mL (ref 0.35–4.50)

## 2017-01-12 LAB — HEMOGLOBIN A1C: Hgb A1c MFr Bld: 5.9 % (ref 4.6–6.5)

## 2017-01-12 NOTE — Assessment & Plan Note (Signed)
PLAN: DM: Last A1c 7.1, metformin dose increase, she is also taking glimepiride, Actos.  Check a A1c HTN: Seems well controlled on Lotensin, HCTZ, metoprolol.  Check a BMP Hypothyroidism: On Synthroid, due for TSH Edema, lower extremities: Discussed low-salt diet, leg elevation twice a day, compression stockings.  Actos may be contributing to swelling. Hand injury: Improving, call if they recovery does not continue. Vitamin D: S/P ergocalciferol, recommend OTC vitamin D 2000 units daily Flu shot today RTC CPX 4 months, fasting

## 2017-01-12 NOTE — Progress Notes (Signed)
Subjective:    Patient ID: [name], female    DOB: 30-Sep-1950, 66 y.o.   MRN: [id number]  DOS:  01/12/2017 Type of visit - description : rov Interval history: Hand injury: See last visit, pain decreased 50%, not completely gone. HTN: Good compliance with medication, ambulatory BPs are normal DM: Good compliance to medication, ambulatory CBGs okay, this morning was 105. Vitamin D deficiency: Had ergocalciferol.  Currently not taking OTCs. Hypothyroidism: Due for a TSH   Review of Systems  Denies chest pain or difficulty breathing Does have lower extremity edema around the ankles and feet, worse at the end of the day. No nausea, vomiting, diarrhea.  Past Medical History:  Diagnosis Date  . Abnormal nerve conduction studies 08/2015   moderate R carpal tunnel syndrome   . Bradycardia   . Complication of anesthesia    HR dropped to 20  . Depression   . Diabetes mellitus    has diarrhea from medicine  . Dyslipidemia   . Goiter    h/o  . Hypertension   . Hypothyroidism    w/ h/o goiter  . Morbid obesity ([place])   . Osteoarthritis   . PONV (postoperative nausea and vomiting)   . Seasonal allergies   . Sleep apnea    on CPAP    Past Surgical History:  Procedure Laterality Date  . APPENDECTOMY  1970's  . CARPAL TUNNEL RELEASE Right 01/2016  . COLONOSCOPY    . KNEE ARTHROSCOPY W/ MENISCECTOMY Right 2007  . LEFT TOTAL KNEE ARTHROPLASTY Left 07/01/2013   Performed by Mauri Pole, MD at [facility]  . TOTAL KNEE ARTHROPLASTY  08/05/07   right    Social History   Socioeconomic History  . Marital status: Married    Spouse name: Not on file  . Number of children: 1  . Years of education: Not on file  . Highest education level: Not on file  Social Needs  . Financial resource strain: Not on file  . Food insecurity - worry: Not on file  . Food insecurity - inability: Not on file  . Transportation needs - medical: Not on file  . Transportation needs - non-medical:  Not on file  Occupational History  . Occupation: retired 01-2014  Tobacco Use  . Smoking status: Former Smoker    Packs/day: 0.50    Years: 10.00    Pack years: 5.00    Types: Cigarettes    Last attempt to quit: 02/28/1980    Years since quitting: 36.8  . Smokeless tobacco: Never Used  Substance and Sexual Activity  . Alcohol use: Yes    Comment: occasionally  . Drug use: No  . Sexual activity: Not on file  Other Topics Concern  . Not on file  Social History Narrative   1 child: lost child 01/2010     Lost brother ~ 57   Lost parents ~ 2016   Husband disable: disability 1991, strokes, bipolar, DM, wheelchair bound   Sister lives in [place], extended family spread throughout  [place]      Allergies as of 01/12/2017   No Known Allergies     Medication List        Accurate as of 01/12/17  6:09 PM. Always use your most recent med list.          ALPRAZolam 0.5 MG tablet Commonly known as:  XANAX Take 1 tablet (0.5 mg total) by mouth 3 (three) times daily as needed for anxiety.  aspirin 81 MG tablet Take 81 mg by mouth daily.   benazepril 40 MG tablet Commonly known as:  LOTENSIN Take 1 tablet (40 mg total) by mouth daily.   CALCIUM + D PO Take 1 tablet by mouth daily.   cetirizine 10 MG tablet Commonly known as:  ZYRTEC Take 10 mg by mouth daily.   cholecalciferol 1000 units tablet Commonly known as:  VITAMIN D Take 2,000 Units daily by mouth.   colchicine 0.6 MG tablet Take 1 tablet (0.6 mg total) by mouth 2 (two) times daily as needed.   cyclobenzaprine 10 MG tablet Commonly known as:  FLEXERIL Take 1 tablet (10 mg total) by mouth at bedtime as needed for muscle spasms.   gabapentin 300 MG capsule Commonly known as:  NEURONTIN Take 1 capsule (300 mg total) by mouth at bedtime.   glimepiride 4 MG tablet Commonly known as:  AMARYL Take 1 tablet (4 mg total) by mouth daily with breakfast.   glucose blood test strip Commonly known as:  ONE TOUCH  ULTRA TEST Use as instructed   hydrochlorothiazide 25 MG tablet Commonly known as:  HYDRODIURIL Take 1 tablet (25 mg total) by mouth daily.   indomethacin 50 MG capsule Commonly known as:  INDOCIN Take 1 capsule (50 mg total) by mouth every 8 (eight) hours as needed. Reported on 08/13/2015   levothyroxine 150 MCG tablet Commonly known as:  SYNTHROID, LEVOTHROID Take 1 tablet (150 mcg total) by mouth daily before breakfast.   metFORMIN 1000 MG tablet Commonly known as:  GLUCOPHAGE Take 1.5 tablets by mouth every morning and 1 tablet by mouth every evening.   metoprolol tartrate 50 MG tablet Commonly known as:  LOPRESSOR Take 0.5 tablets (25 mg total) by mouth 2 (two) times daily.   pantoprazole 40 MG tablet Commonly known as:  PROTONIX Take 40 mg by mouth daily.   pioglitazone 30 MG tablet Commonly known as:  ACTOS Take 1 tablet (30 mg total) by mouth daily.   rosuvastatin 10 MG tablet Commonly known as:  CRESTOR Take 1 tablet (10 mg total) by mouth daily.   sertraline 100 MG tablet Commonly known as:  ZOLOFT Take 2 tablets (200 mg total) by mouth daily.          Objective:   Physical Exam BP 132/68 (BP Location: Left Arm, Patient Position: Sitting, Cuff Size: Normal)   Pulse (!) 58   Temp 98.2 F (36.8 C) (Oral)   Resp 14   Ht 5\' 5"  (1.651 m)   Wt 272 lb 8 oz (123.6 kg)   SpO2 98%   BMI 45.35 kg/m  General:   Well developed, well nourished . NAD.  HEENT:  Normocephalic . Face symmetric, atraumatic Lungs:  CTA B Normal respiratory effort, no intercostal retractions, no accessory muscle use. Heart: RRR,  no murmur.  +/+++  pretibial edema bilaterally  Skin: Not pale. Not jaundice Neurologic:  alert & oriented X3.  Speech normal, gait appropriate for age and unassisted Psych--  Cognition and judgment appear intact.  Cooperative with normal attention span and concentration.  Behavior appropriate. No anxious or depressed appearing.      Assessment  & Plan:   Assessment   DM: w/ neuropathy, L toe numbness, foot exam 07-2016 negative HTN Hyperlipidemia Hypothyroidism, h/o goiter Depression- anxiety Edema and LE varicose veins  H/o Bronchospasm DJD GOUT-- dx else where ~ 05-2015, sx decreased w/  colchicine ?RLS-- sx decreased w/ gabapentin (2016) Vit D def  Morbid obesity OSA  on CPAP Dr Halford Chessman Bradycardia after anesthesia  PLAN: DM: Last A1c 7.1, metformin dose increase, she is also taking glimepiride, Actos.  Check a A1c HTN: Seems well controlled on Lotensin, HCTZ, metoprolol.  Check a BMP Hypothyroidism: On Synthroid, due for TSH Edema, lower extremities: Discussed low-salt diet, leg elevation twice a day, compression stockings.  Actos may be contributing to swelling. Hand injury: Improving, call if they recovery does not continue. Vitamin D: S/P ergocalciferol, recommend OTC vitamin D 2000 units daily Flu shot today RTC CPX 4 months, fasting

## 2017-01-12 NOTE — Patient Instructions (Signed)
GO TO THE LAB : Get the blood work     GO TO THE FRONT DESK Schedule your next appointment for a physical exam in 3-4 months.  Fasting.   Low-Sodium Eating Plan Sodium, which is an element that makes up salt, helps you maintain a healthy balance of fluids in your body. Too much sodium can increase your blood pressure and cause fluid and waste to be held in your body. Your health care provider or dietitian may recommend following this plan if you have high blood pressure (hypertension), kidney disease, liver disease, or heart failure. Eating less sodium can help lower your blood pressure, reduce swelling, and protect your heart, liver, and kidneys. What are tips for following this plan? General guidelines  Most people on this plan should limit their sodium intake to 1,500-2,000 mg (milligrams) of sodium each day. Reading food labels  The Nutrition Facts label lists the amount of sodium in one serving of the food. If you eat more than one serving, you must multiply the listed amount of sodium by the number of servings.  Choose foods with less than 140 mg of sodium per serving.  Avoid foods with 300 mg of sodium or more per serving. Shopping  Look for lower-sodium products, often labeled as "low-sodium" or "no salt added."  Always check the sodium content even if foods are labeled as "unsalted" or "no salt added".  Buy fresh foods. ? Avoid canned foods and premade or frozen meals. ? Avoid canned, cured, or processed meats  Buy breads that have less than 80 mg of sodium per slice. Cooking  Eat more home-cooked food and less restaurant, buffet, and fast food.  Avoid adding salt when cooking. Use salt-free seasonings or herbs instead of table salt or sea salt. Check with your health care provider or pharmacist before using salt substitutes.  Cook with plant-based oils, such as canola, sunflower, or olive oil. Meal planning  When eating at a restaurant, ask that your food be  prepared with less salt or no salt, if possible.  Avoid foods that contain MSG (monosodium glutamate). MSG is sometimes added to [place] food, bouillon, and some canned foods. What foods are recommended? The items listed may not be a complete list. Talk with your dietitian about what dietary choices are best for you. Grains Low-sodium cereals, including oats, puffed wheat and rice, and shredded wheat. Low-sodium crackers. Unsalted rice. Unsalted pasta. Low-sodium bread. Whole-grain breads and whole-grain pasta. Vegetables Fresh or frozen vegetables. "No salt added" canned vegetables. "No salt added" tomato sauce and paste. Low-sodium or reduced-sodium tomato and vegetable juice. Fruits Fresh, frozen, or canned fruit. Fruit juice. Meats and other protein foods Fresh or frozen (no salt added) meat, poultry, seafood, and fish. Low-sodium canned tuna and salmon. Unsalted nuts. Dried peas, beans, and lentils without added salt. Unsalted canned beans. Eggs. Unsalted nut butters. Dairy Milk. Soy milk. Cheese that is naturally low in sodium, such as ricotta cheese, fresh mozzarella, or Swiss cheese Low-sodium or reduced-sodium cheese. Cream cheese. Yogurt. Fats and oils Unsalted butter. Unsalted margarine with no trans fat. Vegetable oils such as canola or olive oils. Seasonings and other foods Fresh and dried herbs and spices. Salt-free seasonings. Low-sodium mustard and ketchup. Sodium-free salad dressing. Sodium-free light mayonnaise. Fresh or refrigerated horseradish. Lemon juice. Vinegar. Homemade, reduced-sodium, or low-sodium soups. Unsalted popcorn and pretzels. Low-salt or salt-free chips. What foods are not recommended? The items listed may not be a complete list. Talk with your dietitian about what dietary  choices are best for you. Grains Instant hot cereals. Bread stuffing, pancake, and biscuit mixes. Croutons. Seasoned rice or pasta mixes. Noodle soup cups. Boxed or frozen macaroni and  cheese. Regular salted crackers. Self-rising flour. Vegetables Sauerkraut, pickled vegetables, and relishes. Olives. [place] fries. Onion rings. Regular canned vegetables (not low-sodium or reduced-sodium). Regular canned tomato sauce and paste (not low-sodium or reduced-sodium). Regular tomato and vegetable juice (not low-sodium or reduced-sodium). Frozen vegetables in sauces. Meats and other protein foods Meat or fish that is salted, canned, smoked, spiced, or pickled. Bacon, ham, sausage, hotdogs, corned beef, chipped beef, packaged lunch meats, salt pork, jerky, pickled herring, anchovies, regular canned tuna, sardines, salted nuts. Dairy Processed cheese and cheese spreads. Cheese curds. Blue cheese. Feta cheese. String cheese. Regular cottage cheese. Buttermilk. Canned milk. Fats and oils Salted butter. Regular margarine. Ghee. Bacon fat. Seasonings and other foods Onion salt, garlic salt, seasoned salt, table salt, and sea salt. Canned and packaged gravies. Worcestershire sauce. Tartar sauce. Barbecue sauce. Teriyaki sauce. Soy sauce, including reduced-sodium. Steak sauce. Fish sauce. Oyster sauce. Cocktail sauce. Horseradish that you find on the shelf. Regular ketchup and mustard. Meat flavorings and tenderizers. Bouillon cubes. Hot sauce and Tabasco sauce. Premade or packaged marinades. Premade or packaged taco seasonings. Relishes. Regular salad dressings. Salsa. Potato and tortilla chips. Corn chips and puffs. Salted popcorn and pretzels. Canned or dried soups. Pizza. Frozen entrees and pot pies. Summary  Eating less sodium can help lower your blood pressure, reduce swelling, and protect your heart, liver, and kidneys.  Most people on this plan should limit their sodium intake to 1,500-2,000 mg (milligrams) of sodium each day.  Canned, boxed, and frozen foods are high in sodium. Restaurant foods, fast foods, and pizza are also very high in sodium. You also get sodium by adding salt to  food.  Try to cook at home, eat more fresh fruits and vegetables, and eat less fast food, canned, processed, or prepared foods. This information is not intended to replace advice given to you by your health care provider. Make sure you discuss any questions you have with your health care provider. Document Released: 08/05/2001 Document Revised: 02/07/2016 Document Reviewed: 02/07/2016 Elsevier Interactive Patient Education  2017 [employer].

## 2017-01-12 NOTE — Progress Notes (Signed)
Pre visit review using our clinic review tool, if applicable. No additional management support is needed unless otherwise documented below in the visit note. 

## 2017-01-17 ENCOUNTER — Encounter: Payer: Self-pay | Admitting: Internal Medicine

## 2017-01-17 DIAGNOSIS — L401 Generalized pustular psoriasis: Secondary | ICD-10-CM | POA: Diagnosis not present

## 2017-03-28 ENCOUNTER — Other Ambulatory Visit: Payer: Self-pay | Admitting: Internal Medicine

## 2017-03-28 MED ORDER — GABAPENTIN 300 MG PO CAPS: 300.0000 mg | ORAL_CAPSULE | Freq: Every day | ORAL | 1 refills | Status: DC

## 2017-03-28 MED ORDER — BENAZEPRIL HCL 40 MG PO TABS: 40.0000 mg | ORAL_TABLET | Freq: Every day | ORAL | 1 refills | Status: DC

## 2017-03-28 MED ORDER — METFORMIN HCL 1000 MG PO TABS: ORAL_TABLET | ORAL | 1 refills | Status: DC

## 2017-03-28 NOTE — Telephone Encounter (Signed)
Copied from [place] [id number]. Topic: Quick Communication - Rx Refill/Question >> Mar 28, 2017  2:27 PM Aurelio Brash B wrote: Medication:gabapentin (NEURONTIN) 300 MG capsule  levothyroxine (SYNTHROID, LEVOTHROID) 150 MCG tablet  benazepril (LOTENSIN) 40 MG tablet  Has the patient contacted their pharmacy? No, [place] and [place] medication- they have no refills she has enough for one more day  (Agent: If no, request that the patient contact the pharmacy for the refill.)   Preferred Pharmacy (with phone number or street name)[id number] - [place], [place] - [address] [phone] (Phone) [phone] (Fax)     Agent: Please be advised that RX refills may take up to 3 business days. We ask that you follow-up with your pharmacy.

## 2017-03-28 NOTE — Telephone Encounter (Signed)
Will route to [facility] for disposition; last office visit 01/12/17 with Dr Larose Kells.

## 2017-03-28 NOTE — Telephone Encounter (Signed)
Rxs sent

## 2017-04-05 ENCOUNTER — Telehealth: Payer: Self-pay | Admitting: Internal Medicine

## 2017-04-05 MED ORDER — LEVOTHYROXINE SODIUM 150 MCG PO TABS: 150.0000 ug | ORAL_TABLET | Freq: Every day | ORAL | 1 refills | Status: DC

## 2017-04-05 NOTE — Telephone Encounter (Signed)
Copied from [place]. Topic: Quick Communication - Rx Refill/Question >> Apr 05, 2017 12:20 PM Burnis Medin, NT wrote: Medication: levothyroxine (SYNTHROID, LEVOTHROID) 150 MCG tablet   Has the patient contacted their pharmacy? Yes    (Agent: If no, request that the patient contact the pharmacy for the refill.)  [id number] - [place], [place] - [address] [phone] (Phone) [phone] (Fax)   Preferred Pharmacy (with phone number or street name):    Agent: Please be advised that RX refills may take up to 3 business days. We ask that you follow-up with your pharmacy.

## 2017-04-21 DIAGNOSIS — J019 Acute sinusitis, unspecified: Secondary | ICD-10-CM | POA: Diagnosis not present

## 2017-05-03 ENCOUNTER — Telehealth: Payer: Self-pay | Admitting: Internal Medicine

## 2017-05-03 MED ORDER — PIOGLITAZONE HCL 30 MG PO TABS: 30.0000 mg | ORAL_TABLET | Freq: Every day | ORAL | 1 refills | Status: DC

## 2017-05-03 MED ORDER — SERTRALINE HCL 100 MG PO TABS: 200.0000 mg | ORAL_TABLET | Freq: Every day | ORAL | 1 refills | Status: DC

## 2017-05-03 MED ORDER — METOPROLOL TARTRATE 50 MG PO TABS: 25.0000 mg | ORAL_TABLET | Freq: Two times a day (BID) | ORAL | 1 refills | Status: DC

## 2017-05-03 NOTE — Telephone Encounter (Signed)
Copied from [place] [id number]. Topic: Quick Communication - Rx Refill/Question >> May 03, 2017  1:37 PM Celedonio Savage L wrote: Medication: metoprolol tartrate (LOPRESSOR) 50 MG tablet   sertraline (ZOLOFT) 100 MG tablet   pioglitazone (ACTOS) 30 MG tablet   Has the patient contacted their pharmacy? No.   Patient is in [place] for 1 month   (Agent: If no, request that the patient contact the pharmacy for the refill.)   Preferred Pharmacy (with phone number or street name): [id number] - [place], [place] - [address] [phone] (Phone) [phone] (Fax)     Agent: Please be advised that RX refills may take up to 3 business days. We ask that you follow-up with your pharmacy.

## 2017-05-08 ENCOUNTER — Ambulatory Visit: Payer: Medicare HMO | Admitting: Internal Medicine

## 2017-05-24 ENCOUNTER — Other Ambulatory Visit: Payer: Self-pay | Admitting: Internal Medicine

## 2017-05-29 ENCOUNTER — Encounter: Payer: Self-pay | Admitting: Internal Medicine

## 2017-05-29 ENCOUNTER — Ambulatory Visit (INDEPENDENT_AMBULATORY_CARE_PROVIDER_SITE_OTHER): Payer: Medicare HMO | Admitting: Internal Medicine

## 2017-05-29 VITALS — BP 126/68 | HR 78 | Temp 98.2°F | Resp 16 | Ht 65.0 in | Wt 259.1 lb

## 2017-05-29 DIAGNOSIS — F329 Major depressive disorder, single episode, unspecified: Secondary | ICD-10-CM | POA: Diagnosis not present

## 2017-05-29 DIAGNOSIS — E118 Type 2 diabetes mellitus with unspecified complications: Secondary | ICD-10-CM

## 2017-05-29 DIAGNOSIS — Z79899 Other long term (current) drug therapy: Secondary | ICD-10-CM

## 2017-05-29 DIAGNOSIS — I10 Essential (primary) hypertension: Secondary | ICD-10-CM

## 2017-05-29 DIAGNOSIS — F32A Depression, unspecified: Secondary | ICD-10-CM

## 2017-05-29 DIAGNOSIS — F419 Anxiety disorder, unspecified: Secondary | ICD-10-CM | POA: Diagnosis not present

## 2017-05-29 DIAGNOSIS — E039 Hypothyroidism, unspecified: Secondary | ICD-10-CM | POA: Diagnosis not present

## 2017-05-29 DIAGNOSIS — R69 Illness, unspecified: Secondary | ICD-10-CM | POA: Diagnosis not present

## 2017-05-29 MED ORDER — ALPRAZOLAM 0.5 MG PO TABS: 0.5000 mg | ORAL_TABLET | Freq: Three times a day (TID) | ORAL | 1 refills | Status: AC | PRN

## 2017-05-29 NOTE — Progress Notes (Signed)
Subjective:    Patient ID: [name], female    DOB: [birth year], 67 y.o.   MRN: [id number]  DOS:  05/29/2017 Type of visit - description : rov Interval history: Depression with anxiety: Good med compliance, under a lot of stress, related to her husband's health.  Her husband has a  difficult time sleeping and wakes [name] up High cholesterol: Good compliance of medication. HTN: Good compliance with medication, ambulatory BP is 120/80 Hypothyroidism: On Synthroid.   Review of Systems Denies suicidal ideas No chest pain, difficulty breathing or lower extremity edema No nausea, vomiting, diarrhea.   Past Medical History:  Diagnosis Date  . Abnormal nerve conduction studies 08/2015   moderate R carpal tunnel syndrome   . Bradycardia   . Complication of anesthesia    HR dropped to 20  . Depression   . Diabetes mellitus    has diarrhea from medicine  . Dyslipidemia   . Goiter    h/o  . Hypertension   . Hypothyroidism    w/ h/o goiter  . Morbid obesity ([place])   . Osteoarthritis   . PONV (postoperative nausea and vomiting)   . Seasonal allergies   . Sleep apnea    on CPAP    Past Surgical History:  Procedure Laterality Date  . APPENDECTOMY  1970's  . CARPAL TUNNEL RELEASE Right 01/2016  . COLONOSCOPY    . KNEE ARTHROSCOPY W/ MENISCECTOMY Right 2007  . TOTAL KNEE ARTHROPLASTY  08/05/07   right  . TOTAL KNEE ARTHROPLASTY Left 07/01/2013   Procedure: LEFT TOTAL KNEE ARTHROPLASTY;  Surgeon: Mauri Pole, MD;  Location: [facility];  Service: Orthopedics;  Laterality: Left;    Social History   Socioeconomic History  . Marital status: Married    Spouse name: Not on file  . Number of children: 1  . Years of education: Not on file  . Highest education level: Not on file  Occupational History  . Occupation: retired 01-2014  Social Needs  . Financial resource strain: Not on file  . Food insecurity:    Worry: Not on file    Inability: Not on file  . Transportation  needs:    Medical: Not on file    Non-medical: Not on file  Tobacco Use  . Smoking status: Former Smoker    Packs/day: 0.50    Years: 10.00    Pack years: 5.00    Types: Cigarettes    Last attempt to quit: 02/28/1980    Years since quitting: 37.2  . Smokeless tobacco: Never Used  Substance and Sexual Activity  . Alcohol use: Yes    Comment: occasionally  . Drug use: No  . Sexual activity: Not on file  Lifestyle  . Physical activity:    Days per week: Not on file    Minutes per session: Not on file  . Stress: Not on file  Relationships  . Social connections:    Talks on phone: Not on file    Gets together: Not on file    Attends religious service: Not on file    Active member of club or organization: Not on file    Attends meetings of clubs or organizations: Not on file    Relationship status: Not on file  . Intimate partner violence:    Fear of current or ex partner: Not on file    Emotionally abused: Not on file    Physically abused: Not on file    Forced sexual activity: Not  on file  Other Topics Concern  . Not on file  Social History Narrative   1 child: lost child 01/2010     Lost brother ~ 58   Lost parents ~ 2016   Husband disable: disability 1991, strokes, bipolar, DM, wheelchair bound   Sister lives in [place], extended family spread throughout  [place]      Allergies as of 05/29/2017   No Known Allergies     Medication List        Accurate as of 05/29/17 11:59 PM. Always use your most recent med list.          ALPRAZolam 0.5 MG tablet Commonly known as:  XANAX Take 1 tablet (0.5 mg total) by mouth 3 (three) times daily as needed for anxiety.   aspirin 81 MG tablet Take 81 mg by mouth daily.   benazepril 40 MG tablet Commonly known as:  LOTENSIN Take 1 tablet (40 mg total) by mouth daily.   CALCIUM + D PO Take 1 tablet by mouth daily.   cetirizine 10 MG tablet Commonly known as:  ZYRTEC Take 10 mg by mouth daily.   cholecalciferol 1000 units  tablet Commonly known as:  VITAMIN D Take 2,000 Units daily by mouth.   colchicine 0.6 MG tablet Take 1 tablet (0.6 mg total) by mouth 2 (two) times daily as needed.   cyclobenzaprine 10 MG tablet Commonly known as:  FLEXERIL Take 1 tablet (10 mg total) by mouth at bedtime as needed for muscle spasms.   gabapentin 300 MG capsule Commonly known as:  NEURONTIN Take 1 capsule (300 mg total) by mouth at bedtime.   glimepiride 4 MG tablet Commonly known as:  AMARYL Take 1 tablet (4 mg total) by mouth daily with breakfast.   glucose blood test strip Commonly known as:  ONE TOUCH ULTRA TEST Use as instructed   hydrochlorothiazide 25 MG tablet Commonly known as:  HYDRODIURIL Take 1 tablet (25 mg total) by mouth daily.   indomethacin 50 MG capsule Commonly known as:  INDOCIN Take 1 capsule (50 mg total) by mouth every 8 (eight) hours as needed. Reported on 08/13/2015   levothyroxine 150 MCG tablet Commonly known as:  SYNTHROID, LEVOTHROID Take 1 tablet (150 mcg total) by mouth daily before breakfast.   metFORMIN 1000 MG tablet Commonly known as:  GLUCOPHAGE Take 1.5 tablets by mouth every morning and 1 tablet by mouth every evening.   metoprolol tartrate 50 MG tablet Commonly known as:  LOPRESSOR Take 0.5 tablets (25 mg total) by mouth 2 (two) times daily.   pantoprazole 40 MG tablet Commonly known as:  PROTONIX Take 40 mg by mouth daily.   pioglitazone 30 MG tablet Commonly known as:  ACTOS Take 1 tablet (30 mg total) by mouth daily.   rosuvastatin 10 MG tablet Commonly known as:  CRESTOR Take 1 tablet (10 mg total) by mouth daily.   sertraline 100 MG tablet Commonly known as:  ZOLOFT Take 2 tablets (200 mg total) by mouth daily.          Objective:   Physical Exam BP 126/68 (BP Location: Left Arm, Patient Position: Sitting, Cuff Size: Normal)   Pulse 78   Temp 98.2 F (36.8 C) (Oral)   Resp 16   Ht 5\' 5"  (1.651 m)   Wt 259 lb 2 oz (117.5 kg)   SpO2 90%    BMI 43.12 kg/m  General:   Well developed, well nourished . NAD.  HEENT:  Normocephalic . Face  symmetric, atraumatic Lungs:  CTA B Normal respiratory effort, no intercostal retractions, no accessory muscle use. Heart: RRR,  no murmur.  Trace pretibial edema bilaterally  Skin: Not pale. Not jaundice Neurologic:  alert & oriented X3.  Speech normal, gait appropriate for age and unassisted Psych--  Cognition and judgment appear intact.  Cooperative with normal attention span and concentration.  Behavior appropriate. No anxious or depressed appearing.      Assessment & Plan:   Assessment   DM: w/ neuropathy, L toe numbness, foot exam 07-2016 negative HTN Hyperlipidemia Hypothyroidism, h/o goiter Depression- anxiety Edema and LE varicose veins  H/o Bronchospasm DJD GOUT-- dx else where ~ 05-2015, sx decreased w/  colchicine ?RLS-- sx decreased w/ gabapentin (2016) Vit D def  Morbid obesity OSA on CPAP Dr Halford Chessman Bradycardia after anesthesia  PLAN: DM: Continue with glimepiride, metformin, Actos.  Ambulatory CBGs 120, 100.  Very rarely feels like her sugar is low and she feels slightly dizzy and has a headache.  Eating chocolate make it better.  Last A1c 5.9.  Check a A1c, based on results consider decrease the dose of glimepiride. HTN: Continue with Lotensin, metoprolol and HCTZ.  Ambulatory BP is 120/80.  Check a BMP Anxiety depression: Mostly related to her husband's health, patient is counseled, continue Zoloft 200 mg and Xanax as needed.  Rec  to see a counselor, information provided.  Will let me know sooner than next appointment if she thinks she needs additional or different medication.  UDS today, contract on RTC Hypothyroidism: On Synthroid, check a TSH L.E. edema: Trace edema today, seems to be doing well. RTC 6 months

## 2017-05-29 NOTE — Progress Notes (Signed)
Pre visit review using our clinic review tool, if applicable. No additional management support is needed unless otherwise documented below in the visit note. 

## 2017-05-29 NOTE — Patient Instructions (Signed)
GO TO THE LAB : Get the blood work     GO TO THE FRONT DESK Schedule your next appointment for a   checkup in 6 months 

## 2017-05-30 LAB — BASIC METABOLIC PANEL
BUN: 40 mg/dL — AB (ref 6–23)
CO2: 24 mEq/L (ref 19–32)
Calcium: 9.9 mg/dL (ref 8.4–10.5)
Chloride: 105 mEq/L (ref 96–112)
Creatinine, Ser: 1.27 mg/dL — ABNORMAL HIGH (ref 0.40–1.20)
GFR: 44.49 mL/min — AB (ref >60.00)
Glucose, Bld: 76 mg/dL (ref 70–99)
POTASSIUM: 4.5 meq/L (ref 3.5–5.1)
SODIUM: 138 meq/L (ref 135–145)

## 2017-05-30 LAB — TSH: TSH: 1.72 u[IU]/mL (ref 0.35–4.50)

## 2017-05-30 LAB — HEMOGLOBIN A1C: HEMOGLOBIN A1C: 6.2 % (ref 4.6–6.5)

## 2017-05-30 NOTE — Assessment & Plan Note (Signed)
DM: Continue with glimepiride, metformin, Actos.  Ambulatory CBGs 120, 100.  Very rarely feels like her sugar is low and she feels slightly dizzy and has a headache.  Eating chocolate make it better.  Last A1c 5.9.  Check a A1c, based on results consider decrease the dose of glimepiride. HTN: Continue with Lotensin, metoprolol and HCTZ.  Ambulatory BP is 120/80.  Check a BMP Anxiety depression: Mostly related to her husband's health, patient is counseled, continue Zoloft 200 mg and Xanax as needed.  Rec  to see a counselor, information provided.  Will let me know sooner than next appointment if she thinks she needs additional or different medication.  UDS today, contract on RTC Hypothyroidism: On Synthroid, check a TSH L.E. edema: Trace edema today, seems to be doing well. RTC 6 months

## 2017-06-03 LAB — PAIN MGMT, PROFILE 8 W/CONF, U
6 Acetylmorphine: NEGATIVE ng/mL (ref <10)
ALCOHOL METABOLITES: NEGATIVE ng/mL (ref <500)
ALPHAHYDROXYTRIAZOLAM: NEGATIVE ng/mL (ref <50)
AMPHETAMINES: NEGATIVE ng/mL (ref <500)
Alphahydroxyalprazolam: NEGATIVE ng/mL (ref <25)
Alphahydroxymidazolam: NEGATIVE ng/mL (ref <50)
Aminoclonazepam: NEGATIVE ng/mL (ref <25)
BUPRENORPHINE, URINE: NEGATIVE ng/mL (ref <5)
Benzodiazepines: NEGATIVE ng/mL (ref <100)
COCAINE METABOLITE: NEGATIVE ng/mL (ref <150)
CREATININE: 69 mg/dL
Hydroxyethylflurazepam: NEGATIVE ng/mL (ref <50)
Lorazepam: NEGATIVE ng/mL (ref <50)
MDMA: NEGATIVE ng/mL (ref <500)
Marijuana Metabolite: NEGATIVE ng/mL (ref <20)
Nordiazepam: NEGATIVE ng/mL (ref <50)
OXAZEPAM: NEGATIVE ng/mL (ref <50)
OXIDANT: NEGATIVE ug/mL (ref <200)
OXYCODONE: NEGATIVE ng/mL (ref <100)
Opiates: NEGATIVE ng/mL (ref <100)
PH: 5.92 (ref 4.5–9.0)
Temazepam: NEGATIVE ng/mL (ref <50)

## 2017-07-07 DIAGNOSIS — R69 Illness, unspecified: Secondary | ICD-10-CM | POA: Diagnosis not present

## 2017-07-07 DIAGNOSIS — M1990 Unspecified osteoarthritis, unspecified site: Secondary | ICD-10-CM | POA: Diagnosis not present

## 2017-07-07 DIAGNOSIS — E7800 Pure hypercholesterolemia, unspecified: Secondary | ICD-10-CM | POA: Diagnosis not present

## 2017-07-07 DIAGNOSIS — E119 Type 2 diabetes mellitus without complications: Secondary | ICD-10-CM | POA: Diagnosis not present

## 2017-07-07 DIAGNOSIS — S299XXA Unspecified injury of thorax, initial encounter: Secondary | ICD-10-CM | POA: Diagnosis not present

## 2017-07-07 DIAGNOSIS — E039 Hypothyroidism, unspecified: Secondary | ICD-10-CM | POA: Diagnosis not present

## 2017-07-07 DIAGNOSIS — I10 Essential (primary) hypertension: Secondary | ICD-10-CM | POA: Diagnosis not present

## 2017-07-07 DIAGNOSIS — R0789 Other chest pain: Secondary | ICD-10-CM | POA: Diagnosis not present

## 2017-07-08 DIAGNOSIS — S299XXA Unspecified injury of thorax, initial encounter: Secondary | ICD-10-CM | POA: Diagnosis not present

## 2017-07-08 DIAGNOSIS — R0789 Other chest pain: Secondary | ICD-10-CM | POA: Diagnosis not present

## 2017-07-12 ENCOUNTER — Other Ambulatory Visit: Payer: Self-pay | Admitting: Internal Medicine

## 2017-08-01 ENCOUNTER — Ambulatory Visit: Payer: Medicare HMO | Admitting: *Deleted

## 2017-08-10 ENCOUNTER — Ambulatory Visit: Payer: Medicare HMO | Admitting: *Deleted

## 2017-08-12 DIAGNOSIS — L401 Generalized pustular psoriasis: Secondary | ICD-10-CM | POA: Diagnosis not present

## 2017-08-13 DIAGNOSIS — L401 Generalized pustular psoriasis: Secondary | ICD-10-CM | POA: Diagnosis not present

## 2017-08-13 NOTE — Progress Notes (Deleted)
Subjective:   [name] is a 67 y.o. female who presents for Medicare Annual (Subsequent) preventive examination.  Review of Systems: No ROS.  Medicare Wellness Visit. Additional risk factors are reflected in the social history.   Sleep patterns:  Home Safety/Smoke Alarms: Feels safe in home. Smoke alarms in place.  Living environment; residence and Firearm Safety:    Female:   Pap-       Mammo-       Dexa scan-  utd      CCS- due 09/06/22     Objective:     Vitals: There were no vitals taken for this visit.  There is no height or weight on file to calculate BMI.  Advanced Directives 07/31/2016 07/01/2013 06/23/2013  Does Patient Have a Medical Advance Directive? Yes Patient has advance directive, copy not in chart Patient has advance directive, copy not in chart  Type of Advance Directive [place];Living will Living will Living will  Does patient want to make changes to medical advance directive? Yes (MAU/Ambulatory/Procedural Areas - Information given) - No change requested  Copy of [place] in Chart? No - copy requested Copy requested from family Copy requested from family  Pre-existing out of facility DNR order (yellow form or pink MOST form) - No No    Tobacco Social History   Tobacco Use  Smoking Status Former Smoker  . Packs/day: 0.50  . Years: 10.00  . Pack years: 5.00  . Types: Cigarettes  . Last attempt to quit: 02/28/1980  . Years since quitting: 37.4  Smokeless Tobacco Never Used     Counseling given: Not Answered   Clinical Intake:                       Past Medical History:  Diagnosis Date  . Abnormal nerve conduction studies 08/2015   moderate R carpal tunnel syndrome   . Bradycardia   . Complication of anesthesia    HR dropped to 20  . Depression   . Diabetes mellitus    has diarrhea from medicine  . Dyslipidemia   . Goiter    h/o  . Hypertension   . Hypothyroidism    w/ h/o goiter    . Morbid obesity ([place])   . Osteoarthritis   . PONV (postoperative nausea and vomiting)   . Seasonal allergies   . Sleep apnea    on CPAP   Past Surgical History:  Procedure Laterality Date  . APPENDECTOMY  1970's  . CARPAL TUNNEL RELEASE Right 01/2016  . COLONOSCOPY    . KNEE ARTHROSCOPY W/ MENISCECTOMY Right 2007  . TOTAL KNEE ARTHROPLASTY  08/05/07   right  . TOTAL KNEE ARTHROPLASTY Left 07/01/2013   Procedure: LEFT TOTAL KNEE ARTHROPLASTY;  Surgeon: Mauri Pole, MD;  Location: [facility];  Service: Orthopedics;  Laterality: Left;   Family History  Problem Relation Age of Onset  . Stomach cancer Brother   . Esophageal cancer Brother   . Coronary artery disease Father        F MI age 61, 66 at age 82  . Hypertension Father   . Diabetes Mother        M , brother amd sister  . Lung cancer Mother        around the lungs  . CAD Brother        died age 78, h/o DM-Tobacco-PVD  . Colon cancer Neg Hx   . Breast  cancer Neg Hx   . Rectal cancer Neg Hx    Social History   Socioeconomic History  . Marital status: Married    Spouse name: Not on file  . Number of children: 1  . Years of education: Not on file  . Highest education level: Not on file  Occupational History  . Occupation: retired 01-2014  Social Needs  . Financial resource strain: Not on file  . Food insecurity:    Worry: Not on file    Inability: Not on file  . Transportation needs:    Medical: Not on file    Non-medical: Not on file  Tobacco Use  . Smoking status: Former Smoker    Packs/day: 0.50    Years: 10.00    Pack years: 5.00    Types: Cigarettes    Last attempt to quit: 02/28/1980    Years since quitting: 37.4  . Smokeless tobacco: Never Used  Substance and Sexual Activity  . Alcohol use: Yes    Comment: occasionally  . Drug use: No  . Sexual activity: Not on file  Lifestyle  . Physical activity:    Days per week: Not on file    Minutes per session: Not on file  . Stress: Not on file   Relationships  . Social connections:    Talks on phone: Not on file    Gets together: Not on file    Attends religious service: Not on file    Active member of club or organization: Not on file    Attends meetings of clubs or organizations: Not on file    Relationship status: Not on file  Other Topics Concern  . Not on file  Social History Narrative   1 child: lost child 01/2010     Lost brother ~ 55   Lost parents ~ 2016   Husband disable: disability 1991, strokes, bipolar, DM, wheelchair bound   Sister lives in [place], extended family spread throughout  [place]    Outpatient Encounter Medications as of 08/17/2017  Medication Sig  . ALPRAZolam (XANAX) 0.5 MG tablet Take 1 tablet (0.5 mg total) by mouth 3 (three) times daily as needed for anxiety.  Marland Kitchen aspirin 81 MG tablet Take 81 mg by mouth daily.  . benazepril (LOTENSIN) 40 MG tablet Take 1 tablet (40 mg total) by mouth daily.  . Calcium Carbonate-Vitamin D (CALCIUM + D PO) Take 1 tablet by mouth daily.   . cetirizine (ZYRTEC) 10 MG tablet Take 10 mg by mouth daily.  . cholecalciferol (VITAMIN D) 1000 units tablet Take 2,000 Units daily by mouth.  . colchicine 0.6 MG tablet Take 1 tablet (0.6 mg total) by mouth 2 (two) times daily as needed.  . cyclobenzaprine (FLEXERIL) 10 MG tablet Take 1 tablet (10 mg total) by mouth at bedtime as needed for muscle spasms.  Marland Kitchen gabapentin (NEURONTIN) 300 MG capsule Take 1 capsule (300 mg total) by mouth at bedtime.  Marland Kitchen glimepiride (AMARYL) 4 MG tablet Take 1 tablet (4 mg total) by mouth daily with breakfast.  . glucose blood (ONE TOUCH ULTRA TEST) test strip Use as instructed  . hydrochlorothiazide (HYDRODIURIL) 25 MG tablet Take 1 tablet (25 mg total) by mouth daily.  . indomethacin (INDOCIN) 50 MG capsule Take 1 capsule (50 mg total) by mouth every 8 (eight) hours as needed. Reported on 08/13/2015  . levothyroxine (SYNTHROID, LEVOTHROID) 150 MCG tablet Take 1 tablet (150 mcg total) by mouth daily  before breakfast.  . metFORMIN (GLUCOPHAGE) 1000  MG tablet Take 1.5 tablets by mouth every morning and 1 tablet by mouth every evening.  . metoprolol tartrate (LOPRESSOR) 50 MG tablet Take 0.5 tablets (25 mg total) by mouth 2 (two) times daily.  . pantoprazole (PROTONIX) 40 MG tablet Take 40 mg by mouth daily.  . pioglitazone (ACTOS) 30 MG tablet Take 1 tablet (30 mg total) by mouth daily.  . rosuvastatin (CRESTOR) 10 MG tablet Take 1 tablet (10 mg total) by mouth daily.  . sertraline (ZOLOFT) 100 MG tablet Take 2 tablets (200 mg total) by mouth daily.   No facility-administered encounter medications on file as of 08/17/2017.     Activities of Daily Living No flowsheet data found.  Patient Care Team: Colon Branch, MD as PCP - Geoffry Paradise, MD as Consulting Physician (Physical Medicine and Rehabilitation) Aloha Gell, MD as Consulting Physician (Obstetrics and Gynecology)    Assessment:   This is a routine wellness examination for [facility]. Physical assessment deferred to PCP.  Exercise Activities and Dietary recommendations   Diet (meal preparation, eat out, water intake, caffeinated beverages, dairy products, fruits and vegetables): {Desc; diets:16563} Breakfast: Lunch:  Dinner:      Goals    None      Fall Risk Fall Risk  07/31/2016 02/29/2016 08/13/2015 05/13/2015 12/07/2014  Falls in the past year? No No No No No    Depression Screen PHQ 2/9 Scores 07/31/2016 02/29/2016 08/13/2015 05/13/2015  PHQ - 2 Score 5 0 0 0  PHQ- 9 Score 13 - - -     Cognitive Function MMSE - Mini Mental State Exam 07/31/2016  Orientation to time 5  Orientation to Place 5  Registration 3  Attention/ Calculation 5  Recall 3  Language- name 2 objects 2  Language- repeat 1  Language- follow 3 step command 3  Language- read & follow direction 1  Write a sentence 1  Copy design 1  Total score 30        Immunization History  Administered Date(s) Administered  . Influenza Split  12/29/2010, 11/12/2013  . Influenza Whole 11/25/2007, 12/21/2008, 12/06/2009  . Influenza, High Dose Seasonal PF 12/07/2014, 01/12/2017  . Influenza, Seasonal, Injecte, Preservative Fre 02/01/2012  . Influenza,inj,Quad PF,6+ Mos 11/27/2012, 12/20/2015  . Pneumococcal Conjugate-13 08/06/2014  . Pneumococcal Polysaccharide-23 12/06/2009, 07/31/2016  . Td 02/03/2000  . Tdap 12/29/2010  . Zoster 02/01/2012   Screening Tests Health Maintenance  Topic Date Due  . MAMMOGRAM  08/14/2017  . FOOT EXAM  09/11/2017  . INFLUENZA VACCINE  09/27/2017  . HEMOGLOBIN A1C  11/28/2017  . OPHTHALMOLOGY EXAM  12/28/2017  . TETANUS/TDAP  12/28/2020  . COLONOSCOPY  09/05/2022  . DEXA SCAN  Completed  . Hepatitis C Screening  Completed  . PNA vac Low Risk Adult  Completed       Plan:   ***   I have personally reviewed and noted the following in the patient's chart:   . Medical and social history . Use of alcohol, tobacco or illicit drugs  . Current medications and supplements . Functional ability and status . Nutritional status . Physical activity . Advanced directives . List of other physicians . Hospitalizations, surgeries, and ER visits in previous 12 months . Vitals . Screenings to include cognitive, depression, and falls . Referrals and appointments  In addition, I have reviewed and discussed with patient certain preventive protocols, quality metrics, and best practice recommendations. A written personalized care plan for preventive services as well as general preventive health recommendations  were provided to patient.     Shela Nevin, [place]  08/13/2017

## 2017-08-16 DIAGNOSIS — G4733 Obstructive sleep apnea (adult) (pediatric): Secondary | ICD-10-CM | POA: Diagnosis not present

## 2017-08-17 ENCOUNTER — Ambulatory Visit: Payer: Medicare HMO | Admitting: *Deleted

## 2017-08-20 ENCOUNTER — Ambulatory Visit: Payer: Medicare HMO | Admitting: *Deleted

## 2017-08-20 NOTE — Progress Notes (Deleted)
Subjective:   [name] is a 67 y.o. female who presents for Medicare Annual (Subsequent) preventive examination.  Analis is primary caregiver of husband who is wheelchair bound.  Review of Systems: No ROS.  Medicare Wellness Visit. Additional risk factors are reflected in the social history.    Sleep patterns: Sleeps very little due to caring for disabled husband. Home Safety/Smoke Alarms: Feels safe in home. Smoke alarms in place.  Living environment; residence and Firearm Safety: Lives with husband in 1 story home.   Female:   Pap-       Mammo-       Dexa scan-utd        CCS- due 2024     Objective:     Vitals: There were no vitals taken for this visit.  There is no height or weight on file to calculate BMI.  Advanced Directives 07/31/2016 07/01/2013 06/23/2013  Does Patient Have a Medical Advance Directive? Yes Patient has advance directive, copy not in chart Patient has advance directive, copy not in chart  Type of Advance Directive [place];Living will Living will Living will  Does patient want to make changes to medical advance directive? Yes (MAU/Ambulatory/Procedural Areas - Information given) - No change requested  Copy of [place] in Chart? No - copy requested Copy requested from family Copy requested from family  Pre-existing out of facility DNR order (yellow form or pink MOST form) - No No    Tobacco Social History   Tobacco Use  Smoking Status Former Smoker  . Packs/day: 0.50  . Years: 10.00  . Pack years: 5.00  . Types: Cigarettes  . Last attempt to quit: 02/28/1980  . Years since quitting: 37.5  Smokeless Tobacco Never Used     Counseling given: Not Answered   Clinical Intake:                       Past Medical History:  Diagnosis Date  . Abnormal nerve conduction studies 08/2015   moderate R carpal tunnel syndrome   . Bradycardia   . Complication of anesthesia    HR dropped to 20  .  Depression   . Diabetes mellitus    has diarrhea from medicine  . Dyslipidemia   . Goiter    h/o  . Hypertension   . Hypothyroidism    w/ h/o goiter  . Morbid obesity ([place])   . Osteoarthritis   . PONV (postoperative nausea and vomiting)   . Seasonal allergies   . Sleep apnea    on CPAP   Past Surgical History:  Procedure Laterality Date  . APPENDECTOMY  1970's  . CARPAL TUNNEL RELEASE Right 01/2016  . COLONOSCOPY    . KNEE ARTHROSCOPY W/ MENISCECTOMY Right 2007  . TOTAL KNEE ARTHROPLASTY  08/05/07   right  . TOTAL KNEE ARTHROPLASTY Left 07/01/2013   Procedure: LEFT TOTAL KNEE ARTHROPLASTY;  Surgeon: Mauri Pole, MD;  Location: [facility];  Service: Orthopedics;  Laterality: Left;   Family History  Problem Relation Age of Onset  . Stomach cancer Brother   . Esophageal cancer Brother   . Coronary artery disease Father        F MI age 72, 48 at age 63  . Hypertension Father   . Diabetes Mother        M , brother amd sister  . Lung cancer Mother        around the lungs  .  CAD Brother        died age 28, h/o DM-Tobacco-PVD  . Colon cancer Neg Hx   . Breast cancer Neg Hx   . Rectal cancer Neg Hx    Social History   Socioeconomic History  . Marital status: Married    Spouse name: Not on file  . Number of children: 1  . Years of education: Not on file  . Highest education level: Not on file  Occupational History  . Occupation: retired 01-2014  Social Needs  . Financial resource strain: Not on file  . Food insecurity:    Worry: Not on file    Inability: Not on file  . Transportation needs:    Medical: Not on file    Non-medical: Not on file  Tobacco Use  . Smoking status: Former Smoker    Packs/day: 0.50    Years: 10.00    Pack years: 5.00    Types: Cigarettes    Last attempt to quit: 02/28/1980    Years since quitting: 37.5  . Smokeless tobacco: Never Used  Substance and Sexual Activity  . Alcohol use: Yes    Comment: occasionally  . Drug use: No  . Sexual  activity: Not on file  Lifestyle  . Physical activity:    Days per week: Not on file    Minutes per session: Not on file  . Stress: Not on file  Relationships  . Social connections:    Talks on phone: Not on file    Gets together: Not on file    Attends religious service: Not on file    Active member of club or organization: Not on file    Attends meetings of clubs or organizations: Not on file    Relationship status: Not on file  Other Topics Concern  . Not on file  Social History Narrative   1 child: lost child 01/2010     Lost brother ~ 66   Lost parents ~ 2016   Husband disable: disability 1991, strokes, bipolar, DM, wheelchair bound   Sister lives in [place], extended family spread throughout  [place]    Outpatient Encounter Medications as of 08/20/2017  Medication Sig  . ALPRAZolam (XANAX) 0.5 MG tablet Take 1 tablet (0.5 mg total) by mouth 3 (three) times daily as needed for anxiety.  Marland Kitchen aspirin 81 MG tablet Take 81 mg by mouth daily.  . benazepril (LOTENSIN) 40 MG tablet Take 1 tablet (40 mg total) by mouth daily.  . Calcium Carbonate-Vitamin D (CALCIUM + D PO) Take 1 tablet by mouth daily.   . cetirizine (ZYRTEC) 10 MG tablet Take 10 mg by mouth daily.  . cholecalciferol (VITAMIN D) 1000 units tablet Take 2,000 Units daily by mouth.  . colchicine 0.6 MG tablet Take 1 tablet (0.6 mg total) by mouth 2 (two) times daily as needed.  . cyclobenzaprine (FLEXERIL) 10 MG tablet Take 1 tablet (10 mg total) by mouth at bedtime as needed for muscle spasms.  Marland Kitchen gabapentin (NEURONTIN) 300 MG capsule Take 1 capsule (300 mg total) by mouth at bedtime.  Marland Kitchen glimepiride (AMARYL) 4 MG tablet Take 1 tablet (4 mg total) by mouth daily with breakfast.  . glucose blood (ONE TOUCH ULTRA TEST) test strip Use as instructed  . hydrochlorothiazide (HYDRODIURIL) 25 MG tablet Take 1 tablet (25 mg total) by mouth daily.  . indomethacin (INDOCIN) 50 MG capsule Take 1 capsule (50 mg total) by mouth every 8  (eight) hours as needed. Reported on 08/13/2015  .  levothyroxine (SYNTHROID, LEVOTHROID) 150 MCG tablet Take 1 tablet (150 mcg total) by mouth daily before breakfast.  . metFORMIN (GLUCOPHAGE) 1000 MG tablet Take 1.5 tablets by mouth every morning and 1 tablet by mouth every evening.  . metoprolol tartrate (LOPRESSOR) 50 MG tablet Take 0.5 tablets (25 mg total) by mouth 2 (two) times daily.  . pantoprazole (PROTONIX) 40 MG tablet Take 40 mg by mouth daily.  . pioglitazone (ACTOS) 30 MG tablet Take 1 tablet (30 mg total) by mouth daily.  . rosuvastatin (CRESTOR) 10 MG tablet Take 1 tablet (10 mg total) by mouth daily.  . sertraline (ZOLOFT) 100 MG tablet Take 2 tablets (200 mg total) by mouth daily.   No facility-administered encounter medications on file as of 08/20/2017.     Activities of Daily Living No flowsheet data found.  Patient Care Team: Colon Branch, MD as PCP - Geoffry Paradise, MD as Consulting Physician (Physical Medicine and Rehabilitation) Aloha Gell, MD as Consulting Physician (Obstetrics and Gynecology)    Assessment:   This is a routine wellness examination for [facility]. Physical assessment deferred to PCP.  Exercise Activities and Dietary recommendations   Diet (meal preparation, eat out, water intake, caffeinated beverages, dairy products, fruits and vegetables): {Desc; diets:16563} Breakfast: Lunch:  Dinner:      Goals    None      Fall Risk Fall Risk  07/31/2016 02/29/2016 08/13/2015 05/13/2015 12/07/2014  Falls in the past year? No No No No No    Depression Screen PHQ 2/9 Scores 07/31/2016 02/29/2016 08/13/2015 05/13/2015  PHQ - 2 Score 5 0 0 0  PHQ- 9 Score 13 - - -     Cognitive Function MMSE - Mini Mental State Exam 07/31/2016  Orientation to time 5  Orientation to Place 5  Registration 3  Attention/ Calculation 5  Recall 3  Language- name 2 objects 2  Language- repeat 1  Language- follow 3 step command 3  Language- read & follow direction 1    Write a sentence 1  Copy design 1  Total score 30        Immunization History  Administered Date(s) Administered  . Influenza Split 12/29/2010, 11/12/2013  . Influenza Whole 11/25/2007, 12/21/2008, 12/06/2009  . Influenza, High Dose Seasonal PF 12/07/2014, 01/12/2017  . Influenza, Seasonal, Injecte, Preservative Fre 02/01/2012  . Influenza,inj,Quad PF,6+ Mos 11/27/2012, 12/20/2015  . Pneumococcal Conjugate-13 08/06/2014  . Pneumococcal Polysaccharide-23 12/06/2009, 07/31/2016  . Td 02/03/2000  . Tdap 12/29/2010  . Zoster 02/01/2012    Screening Tests Health Maintenance  Topic Date Due  . MAMMOGRAM  08/14/2017  . FOOT EXAM  09/11/2017  . INFLUENZA VACCINE  09/27/2017  . HEMOGLOBIN A1C  11/28/2017  . OPHTHALMOLOGY EXAM  12/28/2017  . TETANUS/TDAP  12/28/2020  . COLONOSCOPY  09/05/2022  . DEXA SCAN  Completed  . Hepatitis C Screening  Completed  . PNA vac Low Risk Adult  Completed     Plan:   ***   I have personally reviewed and noted the following in the patient's chart:   . Medical and social history . Use of alcohol, tobacco or illicit drugs  . Current medications and supplements . Functional ability and status . Nutritional status . Physical activity . Advanced directives . List of other physicians . Hospitalizations, surgeries, and ER visits in previous 12 months . Vitals . Screenings to include cognitive, depression, and falls . Referrals and appointments  In addition, I have reviewed and discussed with patient certain preventive  protocols, quality metrics, and best practice recommendations. A written personalized care plan for preventive services as well as general preventive health recommendations were provided to patient.     Naaman Plummer [place], [place]  08/20/2017

## 2017-08-23 ENCOUNTER — Other Ambulatory Visit: Payer: Self-pay | Admitting: Internal Medicine

## 2017-08-27 ENCOUNTER — Ambulatory Visit (INDEPENDENT_AMBULATORY_CARE_PROVIDER_SITE_OTHER): Payer: Medicare HMO | Admitting: *Deleted

## 2017-08-27 ENCOUNTER — Encounter: Payer: Self-pay | Admitting: *Deleted

## 2017-08-27 VITALS — BP 140/87 | HR 60 | Ht 65.0 in | Wt 256.6 lb

## 2017-08-27 DIAGNOSIS — Z0000 Encounter for general adult medical examination without abnormal findings: Secondary | ICD-10-CM | POA: Diagnosis not present

## 2017-08-27 NOTE — Patient Instructions (Signed)
Follow up with Dr.Paz as scheduled 11/28/17  Continue to eat heart healthy diet (full of fruits, vegetables, whole grains, lean protein, water--limit salt, fat, and sugar intake) and increase physical activity as tolerated.  Continue doing brain stimulating activities (puzzles, reading, adult coloring books, staying active) to keep memory sharp.   Bring a copy of your living will and/or healthcare power of attorney to your next office visit.   [name] , Thank you for taking time to come for your Medicare Wellness Visit. I appreciate your ongoing commitment to your health goals. Please review the following plan we discussed and let me know if I can assist you in the future.   These are the goals we discussed: Goals    . Get help with husband so pt can have more self  care  time (pt-stated)       This is a list of the screening recommended for you and due dates:  Health Maintenance  Topic Date Due  . Mammogram  08/14/2017  . Complete foot exam   09/11/2017  . Flu Shot  09/27/2017  . Hemoglobin A1C  11/28/2017  . Eye exam for diabetics  12/28/2017  . Tetanus Vaccine  12/28/2020  . Colon Cancer Screening  09/05/2022  . DEXA scan (bone density measurement)  Completed  .  Hepatitis C: One time screening is recommended by Center for Disease Control  (CDC) for  adults born from [birth year] through 1966.   Completed  . Pneumonia vaccines  Completed    Health Maintenance for Postmenopausal Women Menopause is a normal process in which your reproductive ability comes to an end. This process happens gradually over a span of months to years, usually between the ages of 75 and 68. Menopause is complete when you have missed 12 consecutive menstrual periods. It is important to talk with your health care provider about some of the most common conditions that affect postmenopausal women, such as heart disease, cancer, and bone loss (osteoporosis). Adopting a healthy lifestyle and getting preventive care  can help to promote your health and wellness. Those actions can also lower your chances of developing some of these common conditions. What should I know about menopause? During menopause, you may experience a number of symptoms, such as:  Moderate-to-severe hot flashes.  Night sweats.  Decrease in sex drive.  Mood swings.  Headaches.  Tiredness.  Irritability.  Memory problems.  Insomnia.  Choosing to treat or not to treat menopausal changes is an individual decision that you make with your health care provider. What should I know about hormone replacement therapy and supplements? Hormone therapy products are effective for treating symptoms that are associated with menopause, such as hot flashes and night sweats. Hormone replacement carries certain risks, especially as you become older. If you are thinking about using estrogen or estrogen with progestin treatments, discuss the benefits and risks with your health care provider. What should I know about heart disease and stroke? Heart disease, heart attack, and stroke become more likely as you age. This may be due, in part, to the hormonal changes that your body experiences during menopause. These can affect how your body processes dietary fats, triglycerides, and cholesterol. Heart attack and stroke are both medical emergencies. There are many things that you can do to help prevent heart disease and stroke:  Have your blood pressure checked at least every 1-2 years. High blood pressure causes heart disease and increases the risk of stroke.  If you are 61-97 years old,  ask your health care provider if you should take aspirin to prevent a heart attack or a stroke.  Do not use any tobacco products, including cigarettes, chewing tobacco, or electronic cigarettes. If you need help quitting, ask your health care provider.  It is important to eat a healthy diet and maintain a healthy weight. ? Be sure to include plenty of vegetables,  fruits, low-fat dairy products, and lean protein. ? Avoid eating foods that are high in solid fats, added sugars, or salt (sodium).  Get regular exercise. This is one of the most important things that you can do for your health. ? Try to exercise for at least 150 minutes each week. The type of exercise that you do should increase your heart rate and make you sweat. This is known as moderate-intensity exercise. ? Try to do strengthening exercises at least twice each week. Do these in addition to the moderate-intensity exercise.  Know your numbers.Ask your health care provider to check your cholesterol and your blood glucose. Continue to have your blood tested as directed by your health care provider.  What should I know about cancer screening? There are several types of cancer. Take the following steps to reduce your risk and to catch any cancer development as early as possible. Breast Cancer  Practice breast self-awareness. ? This means understanding how your breasts normally appear and feel. ? It also means doing regular breast self-exams. Let your health care provider know about any changes, no matter how small.  If you are 1 or older, have a clinician do a breast exam (clinical breast exam or CBE) every year. Depending on your age, family history, and medical history, it may be recommended that you also have a yearly breast X-ray (mammogram).  If you have a family history of breast cancer, talk with your health care provider about genetic screening.  If you are at high risk for breast cancer, talk with your health care provider about having an MRI and a mammogram every year.  Breast cancer (BRCA) gene test is recommended for women who have family members with BRCA-related cancers. Results of the assessment will determine the need for genetic counseling and BRCA1 and for BRCA2 testing. BRCA-related cancers include these types: ? Breast. This occurs in males or  females. ? Ovarian. ? Tubal. This may also be called fallopian tube cancer. ? Cancer of the abdominal or pelvic lining (peritoneal cancer). ? Prostate. ? Pancreatic.  Cervical, Uterine, and Ovarian Cancer Your health care provider may recommend that you be screened regularly for cancer of the pelvic organs. These include your ovaries, uterus, and vagina. This screening involves a pelvic exam, which includes checking for microscopic changes to the surface of your cervix (Pap test).  For women ages 21-65, health care providers may recommend a pelvic exam and a Pap test every three years. For women ages 63-65, they may recommend the Pap test and pelvic exam, combined with testing for human papilloma virus (HPV), every five years. Some types of HPV increase your risk of cervical cancer. Testing for HPV may also be done on women of any age who have unclear Pap test results.  Other health care providers may not recommend any screening for nonpregnant women who are considered low risk for pelvic cancer and have no symptoms. Ask your health care provider if a screening pelvic exam is right for you.  If you have had past treatment for cervical cancer or a condition that could lead to cancer, you need Pap  tests and screening for cancer for at least 20 years after your treatment. If Pap tests have been discontinued for you, your risk factors (such as having a new sexual partner) need to be reassessed to determine if you should start having screenings again. Some women have medical problems that increase the chance of getting cervical cancer. In these cases, your health care provider may recommend that you have screening and Pap tests more often.  If you have a family history of uterine cancer or ovarian cancer, talk with your health care provider about genetic screening.  If you have vaginal bleeding after reaching menopause, tell your health care provider.  There are currently no reliable tests available  to screen for ovarian cancer.  Lung Cancer Lung cancer screening is recommended for adults 46-41 years old who are at high risk for lung cancer because of a history of smoking. A yearly low-dose CT scan of the lungs is recommended if you:  Currently smoke.  Have a history of at least 30 pack-years of smoking and you currently smoke or have quit within the past 15 years. A pack-year is smoking an average of one pack of cigarettes per day for one year.  Yearly screening should:  Continue until it has been 15 years since you quit.  Stop if you develop a health problem that would prevent you from having lung cancer treatment.  Colorectal Cancer  This type of cancer can be detected and can often be prevented.  Routine colorectal cancer screening usually begins at age 23 and continues through age 60.  If you have risk factors for colon cancer, your health care provider may recommend that you be screened at an earlier age.  If you have a family history of colorectal cancer, talk with your health care provider about genetic screening.  Your health care provider may also recommend using home test kits to check for hidden blood in your stool.  A small camera at the end of a tube can be used to examine your colon directly (sigmoidoscopy or colonoscopy). This is done to check for the earliest forms of colorectal cancer.  Direct examination of the colon should be repeated every 5-10 years until age 86. However, if early forms of precancerous polyps or small growths are found or if you have a family history or genetic risk for colorectal cancer, you may need to be screened more often.  Skin Cancer  Check your skin from head to toe regularly.  Monitor any moles. Be sure to tell your health care provider: ? About any new moles or changes in moles, especially if there is a change in a mole's shape or color. ? If you have a mole that is larger than the size of a pencil eraser.  If any of your  family members has a history of skin cancer, especially at a young age, talk with your health care provider about genetic screening.  Always use sunscreen. Apply sunscreen liberally and repeatedly throughout the day.  Whenever you are outside, protect yourself by wearing long sleeves, pants, a wide-brimmed hat, and sunglasses.  What should I know about osteoporosis? Osteoporosis is a condition in which bone destruction happens more quickly than new bone creation. After menopause, you may be at an increased risk for osteoporosis. To help prevent osteoporosis or the bone fractures that can happen because of osteoporosis, the following is recommended:  If you are 45-61 years old, get at least 1,000 mg of calcium and at least 600 mg  of vitamin D per day.  If you are older than age 15 but younger than age 10, get at least 1,200 mg of calcium and at least 600 mg of vitamin D per day.  If you are older than age 83, get at least 1,200 mg of calcium and at least 800 mg of vitamin D per day.  Smoking and excessive alcohol intake increase the risk of osteoporosis. Eat foods that are rich in calcium and vitamin D, and do weight-bearing exercises several times each week as directed by your health care provider. What should I know about how menopause affects my mental health? Depression may occur at any age, but it is more common as you become older. Common symptoms of depression include:  Low or sad mood.  Changes in sleep patterns.  Changes in appetite or eating patterns.  Feeling an overall lack of motivation or enjoyment of activities that you previously enjoyed.  Frequent crying spells.  Talk with your health care provider if you think that you are experiencing depression. What should I know about immunizations? It is important that you get and maintain your immunizations. These include:  Tetanus, diphtheria, and pertussis (Tdap) booster vaccine.  Influenza every year before the flu season  begins.  Pneumonia vaccine.  Shingles vaccine.  Your health care provider may also recommend other immunizations. This information is not intended to replace advice given to you by your health care provider. Make sure you discuss any questions you have with your health care provider. Document Released: 04/07/2005 Document Revised: 09/03/2015 Document Reviewed: 11/17/2014 Elsevier Interactive Patient Education  2018 [employer].

## 2017-08-27 NOTE — Progress Notes (Addendum)
Subjective:   [name] is a 67 y.o. female who presents for Medicare Annual (Subsequent) preventive examination.  Review of Systems: No ROS.  Medicare Wellness Visit. Additional risk factors are reflected in the social history. Cardiac Risk Factors include: advanced age (>24men, >12 women);diabetes mellitus;hypertension;dyslipidemia;obesity (BMI >30kg/m2) Sleep patterns: Takes Xanax at bedtime. Sleep varies. 5 hrs last night. Naps as needed.  Home Safety/Smoke Alarms: Feels safe in home. Smoke alarms in place.  Living environment; residence and Adult nurse: lives with disabled husband. Wheelchair bound. 1 story with ramp. Uses hoyer lift to care for husband. Walk in tub.She is his only caregiver.   Female:   Pap- Dr.Kelly. 2018- normal per pt       Mammo-pt states she will schedule.       Dexa scan- utd       CCS-due 2024 Eye-utd per pt   Objective:     Vitals: BP 140/87 (BP Location: Left Arm, Patient Position: Sitting, Cuff Size: Normal)   Pulse 60   Ht 5\' 5"  (1.651 m)   Wt 256 lb 9.6 oz (116.4 kg)   SpO2 98%   BMI 42.70 kg/m   Body mass index is 42.7 kg/m.  Advanced Directives 08/27/2017 07/31/2016 07/01/2013 06/23/2013  Does Patient Have a Medical Advance Directive? No Yes Patient has advance directive, copy not in chart Patient has advance directive, copy not in chart  Type of Advance Directive - [place];Living will Living will Living will  Does patient want to make changes to medical advance directive? - Yes (MAU/Ambulatory/Procedural Areas - Information given) - No change requested  Copy of [place] in Chart? - No - copy requested Copy requested from family Copy requested from family  Would patient like information on creating a medical advance directive? Yes (MAU/Ambulatory/Procedural Areas - Information given) - - -  Pre-existing out of facility DNR order (yellow form or pink MOST form) - - No No    Tobacco Social  History   Tobacco Use  Smoking Status Former Smoker  . Packs/day: 0.50  . Years: 10.00  . Pack years: 5.00  . Types: Cigarettes  . Last attempt to quit: 02/28/1980  . Years since quitting: 37.5  Smokeless Tobacco Never Used     Counseling given: Not Answered   Clinical Intake:     Pain : No/denies pain                 Past Medical History:  Diagnosis Date  . Abnormal nerve conduction studies 08/2015   moderate R carpal tunnel syndrome   . Bradycardia   . Complication of anesthesia    HR dropped to 20  . Depression   . Diabetes mellitus    has diarrhea from medicine  . Dyslipidemia   . Goiter    h/o  . Hypertension   . Hypothyroidism    w/ h/o goiter  . Morbid obesity ([place])   . Osteoarthritis   . PONV (postoperative nausea and vomiting)   . Seasonal allergies   . Sleep apnea    on CPAP   Past Surgical History:  Procedure Laterality Date  . APPENDECTOMY  1970's  . CARPAL TUNNEL RELEASE Right 01/2016  . COLONOSCOPY    . KNEE ARTHROSCOPY W/ MENISCECTOMY Right 2007  . TOTAL KNEE ARTHROPLASTY  08/05/07   right  . TOTAL KNEE ARTHROPLASTY Left 07/01/2013   Procedure: LEFT TOTAL KNEE ARTHROPLASTY;  Surgeon: Mauri Pole, MD;  Location: [facility];  Service: Orthopedics;  Laterality: Left;   Family History  Problem Relation Age of Onset  . Stomach cancer Brother   . Esophageal cancer Brother   . Coronary artery disease Father        F MI age 10, 18 at age 73  . Hypertension Father   . Diabetes Mother        M , brother amd sister  . Lung cancer Mother        around the lungs  . CAD Brother        died age 75, h/o DM-Tobacco-PVD  . Colon cancer Neg Hx   . Breast cancer Neg Hx   . Rectal cancer Neg Hx    Social History   Socioeconomic History  . Marital status: Married    Spouse name: Not on file  . Number of children: 1  . Years of education: Not on file  . Highest education level: Not on file  Occupational History  . Occupation: retired 01-2014   Social Needs  . Financial resource strain: Not on file  . Food insecurity:    Worry: Not on file    Inability: Not on file  . Transportation needs:    Medical: Not on file    Non-medical: Not on file  Tobacco Use  . Smoking status: Former Smoker    Packs/day: 0.50    Years: 10.00    Pack years: 5.00    Types: Cigarettes    Last attempt to quit: 02/28/1980    Years since quitting: 37.5  . Smokeless tobacco: Never Used  Substance and Sexual Activity  . Alcohol use: Yes    Comment: occasionally  . Drug use: No  . Sexual activity: Not on file  Lifestyle  . Physical activity:    Days per week: Not on file    Minutes per session: Not on file  . Stress: Not on file  Relationships  . Social connections:    Talks on phone: Not on file    Gets together: Not on file    Attends religious service: Not on file    Active member of club or organization: Not on file    Attends meetings of clubs or organizations: Not on file    Relationship status: Not on file  Other Topics Concern  . Not on file  Social History Narrative   1 child: lost child 01/2010     Lost brother ~ 74   Lost parents ~ 2016   Husband disable: disability 1991, strokes, bipolar, DM, wheelchair bound   Sister lives in [place], extended family spread throughout  [place]    Outpatient Encounter Medications as of 08/27/2017  Medication Sig  . ALPRAZolam (XANAX) 0.5 MG tablet Take 1 tablet (0.5 mg total) by mouth 3 (three) times daily as needed for anxiety.  Marland Kitchen aspirin 81 MG tablet Take 81 mg by mouth daily.  . benazepril (LOTENSIN) 40 MG tablet Take 1 tablet (40 mg total) by mouth daily.  . Calcium Carbonate-Vitamin D (CALCIUM + D PO) Take 1 tablet by mouth daily.   . cetirizine (ZYRTEC) 10 MG tablet Take 10 mg by mouth daily.  . cyclobenzaprine (FLEXERIL) 10 MG tablet Take 1 tablet (10 mg total) by mouth at bedtime as needed for muscle spasms.  Marland Kitchen gabapentin (NEURONTIN) 300 MG capsule Take 1 capsule (300 mg total) by  mouth at bedtime.  Marland Kitchen glimepiride (AMARYL) 4 MG tablet Take 1 tablet (4 mg total) by mouth daily with breakfast.  .  glucose blood (ONE TOUCH ULTRA TEST) test strip Use as instructed  . hydrochlorothiazide (HYDRODIURIL) 25 MG tablet Take 1 tablet (25 mg total) by mouth daily.  Marland Kitchen levothyroxine (SYNTHROID, LEVOTHROID) 150 MCG tablet Take 1 tablet (150 mcg total) by mouth daily before breakfast.  . metFORMIN (GLUCOPHAGE) 1000 MG tablet Take 1.5 tablets by mouth every morning and 1 tablet by mouth every evening.  . metoprolol tartrate (LOPRESSOR) 50 MG tablet Take 0.5 tablets (25 mg total) by mouth 2 (two) times daily.  . pantoprazole (PROTONIX) 40 MG tablet Take 40 mg by mouth daily.  . pioglitazone (ACTOS) 30 MG tablet Take 1 tablet (30 mg total) by mouth daily.  . rosuvastatin (CRESTOR) 10 MG tablet Take 1 tablet (10 mg total) by mouth daily.  . sertraline (ZOLOFT) 100 MG tablet Take 2 tablets (200 mg total) by mouth daily.  . cholecalciferol (VITAMIN D) 1000 units tablet Take 2,000 Units daily by mouth.  . colchicine 0.6 MG tablet Take 1 tablet (0.6 mg total) by mouth 2 (two) times daily as needed. (Patient not taking: Reported on 08/27/2017)  . indomethacin (INDOCIN) 50 MG capsule Take 1 capsule (50 mg total) by mouth every 8 (eight) hours as needed. Reported on 08/13/2015 (Patient not taking: Reported on 08/27/2017)   No facility-administered encounter medications on file as of 08/27/2017.     Activities of Daily Living In your present state of health, do you have any difficulty performing the following activities: 08/27/2017  Hearing? N  Vision? N  Difficulty concentrating or making decisions? N  Walking or climbing stairs? Y  Comment "bad knees"  Dressing or bathing? N  Doing errands, shopping? N  Preparing Food and eating ? N  Using the Toilet? N  In the past six months, have you accidently leaked urine? N  Do you have problems with loss of bowel control? N  Managing your Medications? N    Managing your Finances? N  Housekeeping or managing your Housekeeping? N  Some recent data might be hidden    Patient Care Team: Colon Branch, MD as PCP - Geoffry Paradise, MD as Consulting Physician (Physical Medicine and Rehabilitation) Aloha Gell, MD as Consulting Physician (Obstetrics and Gynecology)    Assessment:   This is a routine wellness examination for [facility]. Physical assessment deferred to PCP.  Exercise Activities and Dietary recommendations Current Exercise Habits: Home exercise routine, Type of exercise: walking, Time (Minutes): 30, Frequency (Times/Week): 7, Weekly Exercise (Minutes/Week): 210, Intensity: Mild, Exercise limited by: None identified Diet (meal preparation, eat out, water intake, caffeinated beverages, dairy products, fruits and vegetables): well balanced  Goals    . Get help with husband so pt can have more self  care  time (pt-stated)       Fall Risk Fall Risk  08/27/2017 07/31/2016 02/29/2016 08/13/2015 05/13/2015  Falls in the past year? No No No No No     Depression Screen PHQ 2/9 Scores 08/27/2017 07/31/2016 02/29/2016 08/13/2015  PHQ - 2 Score 1 5 0 0  PHQ- 9 Score - 13 - -     Cognitive Function MMSE - Mini Mental State Exam 08/27/2017 07/31/2016  Orientation to time 5 5  Orientation to Place 5 5  Registration 3 3  Attention/ Calculation 5 5  Recall 3 3  Language- name 2 objects 2 2  Language- repeat 1 1  Language- follow 3 step command 3 3  Language- read & follow direction 1 1  Write a sentence 1 1  Copy design 1 1  Total score 30 30        Immunization History  Administered Date(s) Administered  . Influenza Split 12/29/2010, 11/12/2013  . Influenza Whole 11/25/2007, 12/21/2008, 12/06/2009  . Influenza, High Dose Seasonal PF 12/07/2014, 01/12/2017  . Influenza, Seasonal, Injecte, Preservative Fre 02/01/2012  . Influenza,inj,Quad PF,6+ Mos 11/27/2012, 12/20/2015  . Pneumococcal Conjugate-13 08/06/2014  . Pneumococcal  Polysaccharide-23 12/06/2009, 07/31/2016  . Td 02/03/2000  . Tdap 12/29/2010  . Zoster 02/01/2012   Screening Tests Health Maintenance  Topic Date Due  . MAMMOGRAM  08/14/2017  . FOOT EXAM  09/11/2017  . INFLUENZA VACCINE  09/27/2017  . HEMOGLOBIN A1C  11/28/2017  . OPHTHALMOLOGY EXAM  12/28/2017  . TETANUS/TDAP  12/28/2020  . COLONOSCOPY  09/05/2022  . DEXA SCAN  Completed  . Hepatitis C Screening  Completed  . PNA vac Low Risk Adult  Completed       Plan:   Follow up with Dr.Paz as scheduled 11/28/17  Continue to eat heart healthy diet (full of fruits, vegetables, whole grains, lean protein, water--limit salt, fat, and sugar intake) and increase physical activity as tolerated.  Continue doing brain stimulating activities (puzzles, reading, adult coloring books, staying active) to keep memory sharp.   Bring a copy of your living will and/or healthcare power of attorney to your next office visit.    I have personally reviewed and noted the following in the patient's chart:   . Medical and social history . Use of alcohol, tobacco or illicit drugs  . Current medications and supplements . Functional ability and status . Nutritional status . Physical activity . Advanced directives . List of other physicians . Hospitalizations, surgeries, and ER visits in previous 12 months . Vitals . Screenings to include cognitive, depression, and falls . Referrals and appointments  In addition, I have reviewed and discussed with patient certain preventive protocols, quality metrics, and best practice recommendations. A written personalized care plan for preventive services as well as general preventive health recommendations were provided to patient.     Naaman Plummer [place], [place]  08/27/2017 Kathlene November, MD

## 2017-08-31 DIAGNOSIS — L401 Generalized pustular psoriasis: Secondary | ICD-10-CM | POA: Diagnosis not present

## 2017-09-01 DIAGNOSIS — G4730 Sleep apnea, unspecified: Secondary | ICD-10-CM | POA: Diagnosis not present

## 2017-09-01 DIAGNOSIS — R69 Illness, unspecified: Secondary | ICD-10-CM | POA: Diagnosis not present

## 2017-09-01 DIAGNOSIS — G8929 Other chronic pain: Secondary | ICD-10-CM | POA: Diagnosis not present

## 2017-09-01 DIAGNOSIS — E785 Hyperlipidemia, unspecified: Secondary | ICD-10-CM | POA: Diagnosis not present

## 2017-09-01 DIAGNOSIS — E1140 Type 2 diabetes mellitus with diabetic neuropathy, unspecified: Secondary | ICD-10-CM | POA: Diagnosis not present

## 2017-09-01 DIAGNOSIS — F419 Anxiety disorder, unspecified: Secondary | ICD-10-CM | POA: Diagnosis not present

## 2017-09-01 DIAGNOSIS — I10 Essential (primary) hypertension: Secondary | ICD-10-CM | POA: Diagnosis not present

## 2017-09-01 DIAGNOSIS — Z6841 Body Mass Index (BMI) 40.0 and over, adult: Secondary | ICD-10-CM | POA: Diagnosis not present

## 2017-09-01 DIAGNOSIS — E039 Hypothyroidism, unspecified: Secondary | ICD-10-CM | POA: Diagnosis not present

## 2017-09-20 ENCOUNTER — Other Ambulatory Visit: Payer: Self-pay | Admitting: Internal Medicine

## 2017-09-20 DIAGNOSIS — Z1231 Encounter for screening mammogram for malignant neoplasm of breast: Secondary | ICD-10-CM

## 2017-09-26 DIAGNOSIS — N6322 Unspecified lump in the left breast, upper inner quadrant: Secondary | ICD-10-CM | POA: Diagnosis not present

## 2017-09-27 DIAGNOSIS — C50919 Malignant neoplasm of unspecified site of unspecified female breast: Secondary | ICD-10-CM

## 2017-09-27 HISTORY — DX: Malignant neoplasm of unspecified site of unspecified female breast: C50.919

## 2017-10-01 ENCOUNTER — Other Ambulatory Visit: Payer: Self-pay | Admitting: Obstetrics

## 2017-10-01 ENCOUNTER — Other Ambulatory Visit: Payer: Self-pay | Admitting: Internal Medicine

## 2017-10-01 DIAGNOSIS — N6320 Unspecified lump in the left breast, unspecified quadrant: Secondary | ICD-10-CM

## 2017-10-12 ENCOUNTER — Other Ambulatory Visit: Payer: Self-pay | Admitting: Obstetrics

## 2017-10-12 ENCOUNTER — Ambulatory Visit
Admission: RE | Admit: 2017-10-12 | Discharge: 2017-10-12 | Disposition: A | Discharge status: Home / Self Care | Payer: Medicare HMO | Source: Ambulatory Visit | Attending: Obstetrics | Admitting: Obstetrics

## 2017-10-12 DIAGNOSIS — N6320 Unspecified lump in the left breast, unspecified quadrant: Secondary | ICD-10-CM

## 2017-10-12 DIAGNOSIS — N6310 Unspecified lump in the right breast, unspecified quadrant: Secondary | ICD-10-CM

## 2017-10-12 DIAGNOSIS — R928 Other abnormal and inconclusive findings on diagnostic imaging of breast: Secondary | ICD-10-CM | POA: Diagnosis not present

## 2017-10-16 ENCOUNTER — Ambulatory Visit
Admission: RE | Admit: 2017-10-16 | Discharge: 2017-10-16 | Disposition: A | Discharge status: Home / Self Care | Payer: Medicare HMO | Source: Ambulatory Visit | Attending: Obstetrics | Admitting: Obstetrics

## 2017-10-16 DIAGNOSIS — N6310 Unspecified lump in the right breast, unspecified quadrant: Secondary | ICD-10-CM

## 2017-10-16 DIAGNOSIS — N6314 Unspecified lump in the right breast, lower inner quadrant: Secondary | ICD-10-CM | POA: Diagnosis not present

## 2017-10-16 DIAGNOSIS — C50311 Malignant neoplasm of lower-inner quadrant of right female breast: Secondary | ICD-10-CM | POA: Diagnosis not present

## 2017-10-17 ENCOUNTER — Other Ambulatory Visit: Payer: Self-pay | Admitting: Obstetrics

## 2017-10-17 DIAGNOSIS — N63 Unspecified lump in unspecified breast: Secondary | ICD-10-CM

## 2017-10-19 ENCOUNTER — Encounter: Payer: Self-pay | Admitting: *Deleted

## 2017-10-19 ENCOUNTER — Telehealth: Payer: Self-pay | Admitting: Oncology

## 2017-10-19 ENCOUNTER — Telehealth: Payer: Self-pay | Admitting: *Deleted

## 2017-10-19 DIAGNOSIS — C50311 Malignant neoplasm of lower-inner quadrant of right female breast: Secondary | ICD-10-CM | POA: Insufficient documentation

## 2017-10-19 DIAGNOSIS — Z171 Estrogen receptor negative status [ER-]: Principal | ICD-10-CM

## 2017-10-19 NOTE — Telephone Encounter (Signed)
Received Surgical Pathology Report results from [facility]; forwarded to provider/SLS 08/23

## 2017-10-19 NOTE — Telephone Encounter (Signed)
LVM for patient reference upcoming afternoon [facility] appointment for 8/28, packet will be mailed

## 2017-10-19 NOTE — Telephone Encounter (Signed)
Spoke with patient, appointment now confirmed

## 2017-10-22 ENCOUNTER — Ambulatory Visit
Admission: RE | Admit: 2017-10-22 | Discharge: 2017-10-22 | Disposition: A | Discharge status: Home / Self Care | Payer: Medicare HMO | Source: Ambulatory Visit | Attending: Obstetrics | Admitting: Obstetrics

## 2017-10-22 DIAGNOSIS — N6312 Unspecified lump in the right breast, upper inner quadrant: Secondary | ICD-10-CM | POA: Diagnosis not present

## 2017-10-22 DIAGNOSIS — N6321 Unspecified lump in the left breast, upper outer quadrant: Secondary | ICD-10-CM | POA: Diagnosis not present

## 2017-10-22 DIAGNOSIS — N6459 Other signs and symptoms in breast: Secondary | ICD-10-CM | POA: Diagnosis not present

## 2017-10-22 DIAGNOSIS — N63 Unspecified lump in unspecified breast: Secondary | ICD-10-CM

## 2017-10-22 DIAGNOSIS — N6322 Unspecified lump in the left breast, upper inner quadrant: Secondary | ICD-10-CM | POA: Diagnosis not present

## 2017-10-22 DIAGNOSIS — N641 Fat necrosis of breast: Secondary | ICD-10-CM | POA: Diagnosis not present

## 2017-10-22 DIAGNOSIS — N6311 Unspecified lump in the right breast, upper outer quadrant: Secondary | ICD-10-CM | POA: Diagnosis not present

## 2017-10-23 NOTE — Progress Notes (Signed)
[name]  [phone] [phone]     ID: LAPRECIOUS AUSTILL DOB: [birth year]  MR#: [id number]  HTM#:[id number]  Patient Care Team: Colon Branch, MD as PCP - Geoffry Paradise, MD as Consulting Physician (Physical Medicine and Rehabilitation) Aloha Gell, MD as Consulting Physician (Obstetrics and Gynecology) Jovita Kussmaul, MD as Consulting Physician (General Surgery) Lucyle Alumbaugh, Virgie Dad, MD as Consulting Physician (Oncology) Gery Pray, MD as Consulting Physician (Radiation Oncology) Paralee Cancel, MD as Consulting Physician (Orthopedic Surgery) Con Memos as Consulting Physician (Family Medicine) OTHER MD:  CHIEF COMPLAINT: Triple negative breast cancer  CURRENT TREATMENT: Awaiting definitive surgery   HISTORY OF CURRENT ILLNESS: URIYAH Rice was playing with her 100 pound blood hand and the dog master left breast.  She brought the bruising and swelling up to her primary care physician.  The patient underwent bilateral diagnostic mammography with tomography and bilateral breast ultrasonography at [facility] on 10/12/2017 showing: breast density category B. There was a suspicious mass within the lower inner quadrant of the right breast at 3:30 measuring 0.7 x 0.5 x 0.6 cm.  There was a second mass in the right breast located at approximately 12:00, measuring 1 cm.  Sonographically, there were no abnormal right axillary lymph nodes.The area of concern in the left breast was mammographically benign.     On 10/16/2017 [name] proceeded to biopsy of the 330 o'clock right breast area in question. The pathology from this procedure showed ([id number]): Invasive ductal carcinoma, grade 3. Prognostic indicators significant for: both estrogen receptor and progesterone receptor , 0% negative. Proliferation marker Ki67 at 60%. HER2 negative by immunohistochemistry (1+) .  On 10/23/2017 the second area of concern in the right breast, at 12:00,  was biopsied and showed no evidence of malignancy.  At the same time the area and the left breast at 1200 was also biopsied and showed only fat necrosis ([id number]).  These results were concordant.  The patient's subsequent history is as detailed below.  INTERVAL HISTORY: [name] was evaluated in the multidisciplinary breast cancer clinic on 10/24/2017 accompanied by her sister, [name]. Her case was also presented at the multidisciplinary breast cancer conference on the same day. At that time a preliminary plan was proposed: Breast conserving surgery, consideration of port placement for chemotherapy, adjuvant radiation.   REVIEW OF SYSTEMS: She notes that her 100lb dog jumped in her lap and stepped on both breasts. She had bruising and hardness on the left and right breast.  This is pretty much resolved the patient denies unusual headaches, visual changes, nausea, vomiting, stiff neck, dizziness, or gait imbalance. There has been no cough, phlegm production, or pleurisy, no chest pain or pressure, and no change in bowel or bladder habits. The patient denies fever, rash, bleeding, unexplained fatigue or unexplained weight loss. A detailed review of systems was otherwise entirely negative.   PAST MEDICAL HISTORY: Past Medical History:  Diagnosis Date  . Abnormal nerve conduction studies 08/2015   moderate R carpal tunnel syndrome   . Bradycardia   . Complication of anesthesia    HR dropped to 20  . Depression   . Diabetes mellitus    has diarrhea from medicine  . Dyslipidemia   . Goiter    h/o  . Hypertension   . Hypothyroidism    w/ h/o goiter  . Morbid obesity ([place])   . Osteoarthritis   . PONV (postoperative nausea and vomiting)   . Seasonal allergies   .  Sleep apnea    on CPAP    PAST SURGICAL HISTORY: Past Surgical History:  Procedure Laterality Date  . APPENDECTOMY  1970's  . CARPAL TUNNEL RELEASE Right 01/2016  . COLONOSCOPY    . KNEE ARTHROSCOPY W/ MENISCECTOMY Right  2007  . TOTAL KNEE ARTHROPLASTY  08/05/07   right  . TOTAL KNEE ARTHROPLASTY Left 07/01/2013   Procedure: LEFT TOTAL KNEE ARTHROPLASTY;  Surgeon: Mauri Pole, MD;  Location: [facility];  Service: Orthopedics;  Laterality: Left;    FAMILY HISTORY Family History  Problem Relation Age of Onset  . Stomach cancer Brother   . Esophageal cancer Brother   . Coronary artery disease Father        F MI age 60, 55 at age 6  . Hypertension Father   . Diabetes Mother        M , brother amd sister  . Lung cancer Mother        around the lungs  . CAD Brother        died age 26, h/o DM-Tobacco-PVD  . Colon cancer Neg Hx   . Breast cancer Neg Hx   . Rectal cancer Neg Hx   The patient's father died at age 59 "due to old age ". The patient's mother died at age 36 due to lung cancer (heavy smoker). The patient had 3 brothers 1 of whom died due to stomach cancer and the other 2 due to heart attacks and diabetes. The patient has 1 sister. There was a paternal grandmother with breast cancer. The patient's mother was adopted and the family has no details of her genetic history. The patient denies a history of ovarian cancer in the family.   GYNECOLOGIC HISTORY:  No LMP recorded. Patient is postmenopausal. Menarche: 67 years old Age at first live birth: 67 years old She is GX P1.  Her LMP was in 2001.  She used contraceptives remotely without complication.  She did not use HRT.  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 The patient's husband, [name], is a [place] War veteran and worked as a Glass blower/designer for [employer]. He is disabled and wheelchair bound from Parkinson's and  PTSD from the [place] war. The patient's daughter, [name] died at age 13 due to heart issues and complications from a car wreck . The patient has no grandchildren. She attends [employer].      ADVANCED DIRECTIVES: The patient's husband is currently her HCPOA, but the patient's intends  to name her sister, [name], who lives in [place].  At the 10/24/2017 visit the patient was given the appropriate documents to complete and document at her discretion.   HEALTH MAINTENANCE: Social History   Tobacco Use  . Smoking status: Former Smoker    Packs/day: 0.50    Years: 10.00    Pack years: 5.00    Types: Cigarettes    Last attempt to quit: 02/28/1980    Years since quitting: 37.6  . Smokeless tobacco: Never Used  Substance Use Topics  . Alcohol use: Yes    Comment: occasionally  . Drug use: No     Colonoscopy: July 2014 / Dr. Carlean Purl diverticulosis  PAP:  Bone density: 09/26/2016 showed a T score of -1.4 osteopenia   No Known Allergies  Current Outpatient Medications  Medication Sig Dispense Refill  . ALPRAZolam (XANAX) 0.5 MG tablet Take 1 tablet (0.5 mg total) by mouth 3 (three) times daily as needed for anxiety.  90 tablet 1  . aspirin 81 MG tablet Take 81 mg by mouth daily.    . benazepril (LOTENSIN) 40 MG tablet Take 1 tablet (40 mg total) by mouth daily. 90 tablet 1  . Calcium Carbonate-Vitamin D (CALCIUM + D PO) Take 1 tablet by mouth daily.     . cholecalciferol (VITAMIN D) 1000 units tablet Take 2,000 Units daily by mouth.    . gabapentin (NEURONTIN) 300 MG capsule Take 1 capsule (300 mg total) by mouth at bedtime. 90 capsule 1  . glimepiride (AMARYL) 4 MG tablet Take 1 tablet (4 mg total) by mouth daily with breakfast. 90 tablet 1  . glucose blood (ONE TOUCH ULTRA TEST) test strip Use as instructed 100 each 12  . hydrochlorothiazide (HYDRODIURIL) 25 MG tablet Take 1 tablet (25 mg total) by mouth daily. 90 tablet 1  . levothyroxine (SYNTHROID, LEVOTHROID) 150 MCG tablet Take 1 tablet (150 mcg total) by mouth daily before breakfast. 90 tablet 1  . metFORMIN (GLUCOPHAGE) 1000 MG tablet TAKE 1.5 TABLETS BY MOUTH EVERY MORNING AND 1 TABLET BY MOUTH EVERY EVENING. 225 tablet 1  . metoprolol tartrate (LOPRESSOR) 50 MG tablet Take 0.5 tablets (25 mg total) by mouth 2  (two) times daily. 90 tablet 1  . pioglitazone (ACTOS) 30 MG tablet Take 1 tablet (30 mg total) by mouth daily. 90 tablet 1  . rosuvastatin (CRESTOR) 10 MG tablet Take 1 tablet (10 mg total) by mouth daily. 90 tablet 1  . sertraline (ZOLOFT) 100 MG tablet Take 2 tablets (200 mg total) by mouth daily. 180 tablet 1  . cetirizine (ZYRTEC) 10 MG tablet Take 10 mg by mouth daily.    . colchicine 0.6 MG tablet Take 1 tablet (0.6 mg total) by mouth 2 (two) times daily as needed. (Patient not taking: Reported on 08/27/2017) 1 tablet 0  . cyclobenzaprine (FLEXERIL) 10 MG tablet Take 1 tablet (10 mg total) by mouth at bedtime as needed for muscle spasms. (Patient not taking: Reported on 10/24/2017) 21 tablet 0  . indomethacin (INDOCIN) 50 MG capsule Take 1 capsule (50 mg total) by mouth every 8 (eight) hours as needed. Reported on 08/13/2015 (Patient not taking: Reported on 08/27/2017) 1 capsule 0  . pantoprazole (PROTONIX) 40 MG tablet Take 40 mg by mouth daily.     No current facility-administered medications for this visit.     OBJECTIVE: Morbidly obese white woman in no acute distress  Vitals:   10/24/17 1304  BP: (!) 103/48  Pulse: (!) 46  Resp: 20  Temp: 97.7 F (36.5 C)  SpO2: 99%     Body mass index is 43.23 kg/m.   Wt Readings from Last 3 Encounters:  10/24/17 259 lb 12.8 oz (117.8 kg)  08/27/17 256 lb 9.6 oz (116.4 kg)  05/29/17 259 lb 2 oz (117.5 kg)      ECOG FS:1 - Symptomatic but completely ambulatory  Ocular: Sclerae unicteric, pupils round and equal Lymphatic: No cervical or supraclavicular adenopathy Lungs no rales or rhonchi Heart regular rate and rhythm Abd soft, obese, nontender, positive bowel sounds MSK no focal spinal tenderness Neuro: non-focal, well-oriented, appropriate affect Breasts: Status post bilateral biopsies.  There are mild bilateral ecchymoses.  I do not palpate well-defined masses in either breast.  Both axillae are benign.   LAB RESULTS:  CMP       Component Value Date/Time   NA 141 10/24/2017 1239   NA 143 02/27/2016   K 4.8 10/24/2017 1239   CL  111 10/24/2017 1239   CO2 23 10/24/2017 1239   GLUCOSE 94 10/24/2017 1239   BUN 36 (H) 10/24/2017 1239   BUN 29 (A) 02/27/2016   CREATININE 1.43 (H) 10/24/2017 1239   CALCIUM 9.3 10/24/2017 1239   PROT 7.0 10/24/2017 1239   ALBUMIN 3.5 10/24/2017 1239   AST 29 10/24/2017 1239   ALT 28 10/24/2017 1239   ALKPHOS 96 10/24/2017 1239   BILITOT 0.3 10/24/2017 1239   GFRNONAA 37 (L) 10/24/2017 1239   GFRAA 43 (L) 10/24/2017 1239    No results found for: TOTALPROTELP, ALBUMINELP, A1GS, A2GS, BETS, BETA2SER, GAMS, MSPIKE, SPEI  No results found for: KPAFRELGTCHN, LAMBDASER, KAPLAMBRATIO  Lab Results  Component Value Date   WBC 5.0 10/24/2017   NEUTROABS 2.8 10/24/2017   HGB 12.3 10/24/2017   HCT 38.2 10/24/2017   MCV 90.2 10/24/2017   PLT 276 10/24/2017    @LASTCHEMISTRY @  No results found for: LABCA2  No components found for: ZOXWRU045  No results for input(s): INR in the last 168 hours.  No results found for: LABCA2  No results found for: WUJ811  No results found for: BJY782  No results found for: NFA213  No results found for: CA2729  No components found for: HGQUANT  No results found for: CEA1 / No results found for: CEA1   No results found for: AFPTUMOR  No results found for: [place]  No results found for: PSA1  Appointment on 10/24/2017  Component Date Value Ref Range Status  . WBC Count 10/24/2017 5.0  3.9 - 10.3 K/uL Final  . RBC 10/24/2017 4.23  3.70 - 5.45 MIL/uL Final  . Hemoglobin 10/24/2017 12.3  11.6 - 15.9 g/dL Final  . HCT 10/24/2017 38.2  34.8 - 46.6 % Final  . MCV 10/24/2017 90.2  79.5 - 101.0 fL Final  . MCH 10/24/2017 29.1  25.1 - 34.0 pg Final  . MCHC 10/24/2017 32.3  31.5 - 36.0 g/dL Final  . RDW 10/24/2017 15.8* 11.2 - 14.5 % Final  . Platelet Count 10/24/2017 276  145 - 400 K/uL Final  . Neutrophils Relative % 10/24/2017 55  %  Final  . Neutro Abs 10/24/2017 2.8  1.5 - 6.5 K/uL Final  . Lymphocytes Relative 10/24/2017 32  % Final  . Lymphs Abs 10/24/2017 1.6  0.9 - 3.3 K/uL Final  . Monocytes Relative 10/24/2017 8  % Final  . Monocytes Absolute 10/24/2017 0.4  0.1 - 0.9 K/uL Final  . Eosinophils Relative 10/24/2017 4  % Final  . Eosinophils Absolute 10/24/2017 0.2  0.0 - 0.5 K/uL Final  . Basophils Relative 10/24/2017 1  % Final  . Basophils Absolute 10/24/2017 0.0  0.0 - 0.1 K/uL Final   Performed at [facility], [place] [address] [place], [place] [address]  . Sodium 10/24/2017 141  135 - 145 mmol/L Final  . Potassium 10/24/2017 4.8  3.5 - 5.1 mmol/L Final  . Chloride 10/24/2017 111  98 - 111 mmol/L Final  . CO2 10/24/2017 23  22 - 32 mmol/L Final  . Glucose, Bld 10/24/2017 94  70 - 99 mg/dL Final  . BUN 10/24/2017 36* 8 - 23 mg/dL Final  . Creatinine 10/24/2017 1.43* 0.44 - 1.00 mg/dL Final  . Calcium 10/24/2017 9.3  8.9 - 10.3 mg/dL Final  . Total Protein 10/24/2017 7.0  6.5 - 8.1 g/dL Final  . Albumin 10/24/2017 3.5  3.5 - 5.0 g/dL Final  . AST 10/24/2017 29  15 - 41 U/L  Final  . ALT 10/24/2017 28  0 - 44 U/L Final  . Alkaline Phosphatase 10/24/2017 96  38 - 126 U/L Final  . Total Bilirubin 10/24/2017 0.3  0.3 - 1.2 mg/dL Final  . GFR, Est Non Af Am 10/24/2017 37* >60 mL/min Final  . GFR, Est AFR Am 10/24/2017 43* >60 mL/min Final   Comment: (NOTE) The eGFR has been calculated using the CKD EPI equation. This calculation has not been validated in all clinical situations. eGFR's persistently <60 mL/min signify possible Chronic Kidney Disease.   Georgiann Hahn gap 10/24/2017 7  5 - 15 Final   Performed at [facility], [place] [address], [place], [place] [address]    (this displays the last labs from the last 3 days)  No results found for: TOTALPROTELP, ALBUMINELP, A1GS, A2GS, BETS, BETA2SER, GAMS, MSPIKE, SPEI (this displays SPEP labs)  No results found for:  KPAFRELGTCHN, LAMBDASER, KAPLAMBRATIO (kappa/lambda light chains)  No results found for: HGBA, HGBA2QUANT, HGBFQUANT, HGBSQUAN (Hemoglobinopathy evaluation)   No results found for: LDH  Lab Results  Component Value Date   IRON 52 07/31/2016   (Iron and TIBC)  Lab Results  Component Value Date   FERRITIN 53.2 07/31/2016    Urinalysis    Component Value Date/Time   COLORURINE YELLOW 06/23/2013 1440   APPEARANCEUR CLEAR 06/23/2013 1440   LABSPEC 1.019 06/23/2013 1440   PHURINE 6.0 06/23/2013 1440   GLUCOSEU 250 (A) 06/23/2013 1440   HGBUR NEGATIVE 06/23/2013 1440   HGBUR negative 04/24/2007 0000   BILIRUBINUR NEGATIVE 06/23/2013 1440   KETONESUR NEGATIVE 06/23/2013 1440   PROTEINUR NEGATIVE 06/23/2013 1440   UROBILINOGEN 0.2 06/23/2013 1440   NITRITE NEGATIVE 06/23/2013 1440   LEUKOCYTESUR SMALL (A) 06/23/2013 1440     STUDIES: US Breast Ltd Uni Left Inc Axilla  Result Date: 10/12/2017 CLINICAL DATA:  67 year old female palpable thickening and bruising in the UPPER LEFT breast discovered on self-examination, decreased in size with bruising nearly resolved. Patient also complains of bruising in the UPPER RIGHT breast. Mild traumatic injuries to the UPPER breasts bilaterally from dog. EXAM: DIGITAL DIAGNOSTIC BILATERAL MAMMOGRAM WITH CAD AND TOMO ULTRASOUND BILATERAL BREAST COMPARISON:  Previous exam(s). ACR Breast Density Category b: There are scattered areas of fibroglandular density. FINDINGS: 2D/3D full field views of both breasts are performed. An irregular 0.7 cm mass within the INNER RIGHT breast is identified. A E 1 cm focal asymmetry with interspersed fat within the anterior UPPER RIGHT breast identified. No suspicious LEFT breast findings are noted. Mammographic images were processed with CAD. On physical exam, ecchymosis at the 12 o'clock position of the RIGHT breast 6 cm from the nipple noted. Targeted ultrasound is performed, showing the following: A 0.7 x 0.5 x 0.6  cm irregular mass at the 3:30 position of the RIGHT breast 5 cm from the nipple. A 1 x 0.8 x 0.9 cm area of fat necrosis at the 12 o'clock position of the RIGHT breast 6 cm from the nipple, directly underlying the patient's ecchymosis. An ill-defined area of fat necrosis changes at the 12 o'clock position of the LEFT breast 4 cm from the nipple, corresponding the area of patient concern. No abnormal RIGHT axillary lymph nodes identified. IMPRESSION: 1. Suspicious 0.7 cm mass within the LOWER INNER RIGHT breast - tissue sampling recommended. 2. No abnormal RIGHT axillary lymph nodes. 3. Benign fat necrosis changes within both UPPER breasts. 4. No other significant mammographic abnormalities. RECOMMENDATION: Ultrasound-guided biopsy of 0.7 cm LOWER INNER RIGHT breast  mass, which will be scheduled. I have discussed the findings and recommendations with the patient. Results were also provided in writing at the conclusion of the visit. If applicable, a reminder letter will be sent to the patient regarding the next appointment. BI-RADS CATEGORY  4: Suspicious. Electronically Signed   By: Margarette Canada M.D.   On: 10/12/2017 14:27   US Breast Ltd Uni Right Inc Axilla  Result Date: 10/12/2017 CLINICAL DATA:  67 year old female palpable thickening and bruising in the UPPER LEFT breast discovered on self-examination, decreased in size with bruising nearly resolved. Patient also complains of bruising in the UPPER RIGHT breast. Mild traumatic injuries to the UPPER breasts bilaterally from dog. EXAM: DIGITAL DIAGNOSTIC BILATERAL MAMMOGRAM WITH CAD AND TOMO ULTRASOUND BILATERAL BREAST COMPARISON:  Previous exam(s). ACR Breast Density Category b: There are scattered areas of fibroglandular density. FINDINGS: 2D/3D full field views of both breasts are performed. An irregular 0.7 cm mass within the INNER RIGHT breast is identified. A E 1 cm focal asymmetry with interspersed fat within the anterior UPPER RIGHT breast identified. No  suspicious LEFT breast findings are noted. Mammographic images were processed with CAD. On physical exam, ecchymosis at the 12 o'clock position of the RIGHT breast 6 cm from the nipple noted. Targeted ultrasound is performed, showing the following: A 0.7 x 0.5 x 0.6 cm irregular mass at the 3:30 position of the RIGHT breast 5 cm from the nipple. A 1 x 0.8 x 0.9 cm area of fat necrosis at the 12 o'clock position of the RIGHT breast 6 cm from the nipple, directly underlying the patient's ecchymosis. An ill-defined area of fat necrosis changes at the 12 o'clock position of the LEFT breast 4 cm from the nipple, corresponding the area of patient concern. No abnormal RIGHT axillary lymph nodes identified. IMPRESSION: 1. Suspicious 0.7 cm mass within the LOWER INNER RIGHT breast - tissue sampling recommended. 2. No abnormal RIGHT axillary lymph nodes. 3. Benign fat necrosis changes within both UPPER breasts. 4. No other significant mammographic abnormalities. RECOMMENDATION: Ultrasound-guided biopsy of 0.7 cm LOWER INNER RIGHT breast mass, which will be scheduled. I have discussed the findings and recommendations with the patient. Results were also provided in writing at the conclusion of the visit. If applicable, a reminder letter will be sent to the patient regarding the next appointment. BI-RADS CATEGORY  4: Suspicious. Electronically Signed   By: Margarette Canada M.D.   On: 10/12/2017 14:27   Mm Diag Breast Tomo Bilateral  Result Date: 10/12/2017 CLINICAL DATA:  67 year old female palpable thickening and bruising in the UPPER LEFT breast discovered on self-examination, decreased in size with bruising nearly resolved. Patient also complains of bruising in the UPPER RIGHT breast. Mild traumatic injuries to the UPPER breasts bilaterally from dog. EXAM: DIGITAL DIAGNOSTIC BILATERAL MAMMOGRAM WITH CAD AND TOMO ULTRASOUND BILATERAL BREAST COMPARISON:  Previous exam(s). ACR Breast Density Category b: There are scattered areas  of fibroglandular density. FINDINGS: 2D/3D full field views of both breasts are performed. An irregular 0.7 cm mass within the INNER RIGHT breast is identified. A E 1 cm focal asymmetry with interspersed fat within the anterior UPPER RIGHT breast identified. No suspicious LEFT breast findings are noted. Mammographic images were processed with CAD. On physical exam, ecchymosis at the 12 o'clock position of the RIGHT breast 6 cm from the nipple noted. Targeted ultrasound is performed, showing the following: A 0.7 x 0.5 x 0.6 cm irregular mass at the 3:30 position of the RIGHT breast 5 cm from  the nipple. A 1 x 0.8 x 0.9 cm area of fat necrosis at the 12 o'clock position of the RIGHT breast 6 cm from the nipple, directly underlying the patient's ecchymosis. An ill-defined area of fat necrosis changes at the 12 o'clock position of the LEFT breast 4 cm from the nipple, corresponding the area of patient concern. No abnormal RIGHT axillary lymph nodes identified. IMPRESSION: 1. Suspicious 0.7 cm mass within the LOWER INNER RIGHT breast - tissue sampling recommended. 2. No abnormal RIGHT axillary lymph nodes. 3. Benign fat necrosis changes within both UPPER breasts. 4. No other significant mammographic abnormalities. RECOMMENDATION: Ultrasound-guided biopsy of 0.7 cm LOWER INNER RIGHT breast mass, which will be scheduled. I have discussed the findings and recommendations with the patient. Results were also provided in writing at the conclusion of the visit. If applicable, a reminder letter will be sent to the patient regarding the next appointment. BI-RADS CATEGORY  4: Suspicious. Electronically Signed   By: Margarette Canada M.D.   On: 10/12/2017 14:27   Mm Clip Placement Left  Result Date: 10/22/2017 CLINICAL DATA:  Ultrasound-guided biopsy was performed of a mass in the 12 o'clock position of the left breast. EXAM: DIAGNOSTIC LEFT MAMMOGRAM POST ULTRASOUND BIOPSY COMPARISON:  Previous exam(s). FINDINGS: Mammographic images  were obtained following ultrasound guided biopsy of a left breast mass. A ribbon shaped biopsy clip is satisfactorily positioned in the region of the biopsied mass in the left breast at 12 o'clock position. IMPRESSION: Satisfactory position of ribbon shaped biopsy clip in the left breast. Final Assessment: Post Procedure Mammograms for Marker Placement Electronically Signed   By: Curlene Dolphin M.D.   On: 10/22/2017 15:52   Mm Clip Placement Right  Result Date: 10/22/2017 CLINICAL DATA:  Ultrasound-guided biopsy was performed of a mass in the 12 o'clock position of the right breast. The patient recently had a mass biopsied in the 3:30 position of the right breast (with a ribbon shaped clip placed in that mass). EXAM: DIAGNOSTIC RIGHT MAMMOGRAM POST ULTRASOUND BIOPSY COMPARISON:  Previous exam(s). FINDINGS: Mammographic images were obtained following ultrasound guided biopsy of mass in the 12 o'clock position of the right breast. A coil shaped biopsy clip is satisfactorily positioned in the mass in the 12 o'clock position of the right breast. IMPRESSION: Satisfactory position of coil shaped biopsy clip in the 12 o'clock position of the right breast. Final Assessment: Post Procedure Mammograms for Marker Placement Electronically Signed   By: Curlene Dolphin M.D.   On: 10/22/2017 15:50   Mm Clip Placement Right  Result Date: 10/16/2017 CLINICAL DATA:  Evaluate biopsy marker EXAM: DIAGNOSTIC RIGHT MAMMOGRAM POST ULTRASOUND BIOPSY COMPARISON:  Previous exam(s). FINDINGS: Mammographic images were obtained following ultrasound guided biopsy of a right breast mass. The biopsy marker is within the biopsied mass. IMPRESSION: Appropriate marker placement as above. Final Assessment: Post Procedure Mammograms for Marker Placement Electronically Signed   By: Dorise Bullion III M.D   On: 10/16/2017 15:06   [place] Lt Breast Bx W Loc Dev 1st Lesion Img Bx Spec US Guide  Addendum Date: 10/24/2017   ADDENDUM REPORT: 10/23/2017  14:21 ADDENDUM: Pathology revealed ADIPOSE TISSUE WITH HEMORRHAGE of the Right breast, 12 o'clock 6 cm fn. This was found to be concordant by Dr. Curlene Dolphin. Pathology revealed FAT NECROSIS of the Left breast, 12 o'clock 4 cm fn. This was found to be concordant by Dr. Curlene Dolphin. Pathology results were discussed with the patient by telephone. The patient reported doing well after the  biopsies with tenderness at the sites. Post biopsy instructions and care were reviewed and questions were answered. The patient was encouraged to call [facility] for any additional concerns. The patient has a recent diagnosis of right breast cancer and should follow her outlined treatment plan. The patient was referred to [facility] on October 24, 2017. Pathology results reported by Terie Purser, RN on 10/23/2017. Electronically Signed   By: Curlene Dolphin M.D.   On: 10/23/2017 14:21   Result Date: 10/24/2017 CLINICAL DATA:  Ultrasound-guided biopsy was recommended of a mass in the superficial aspect of the 12 o'clock position of the left breast. EXAM: ULTRASOUND GUIDED LEFT BREAST CORE NEEDLE BIOPSY COMPARISON:  Previous exam(s). FINDINGS: I met with the patient and we discussed the procedure of ultrasound-guided biopsy, including benefits and alternatives. We discussed the high likelihood of a successful procedure. We discussed the risks of the procedure, including infection, bleeding, tissue injury, clip migration, and inadequate sampling. Informed written consent was given. The usual time-out protocol was performed immediately prior to the procedure. Lesion quadrant: Upper outer quadrant Using sterile technique and 1% Lidocaine as local anesthetic, under direct ultrasound visualization, a 12 gauge spring-loaded device was used to perform biopsy of a mass in the 12 o'clock position of the left breast using a lateral approach.  At the conclusion of the procedure a ribbon tissue marker clip was deployed into the biopsy cavity. Follow up 2 view mammogram was performed and dictated separately. IMPRESSION: Ultrasound guided biopsy of the left breast. No apparent complications. Electronically Signed: By: Curlene Dolphin M.D. On: 10/22/2017 16:04   [place] Rt Breast Bx W Loc Dev 1st Lesion Img Bx Spec US Guide  Addendum Date: 10/24/2017   ADDENDUM REPORT: 10/23/2017 14:21 ADDENDUM: Pathology revealed ADIPOSE TISSUE WITH HEMORRHAGE of the Right breast, 12 o'clock 6 cm fn. This was found to be concordant by Dr. Curlene Dolphin. Pathology revealed FAT NECROSIS of the Left breast, 12 o'clock 4 cm fn. This was found to be concordant by Dr. Curlene Dolphin. Pathology results were discussed with the patient by telephone. The patient reported doing well after the biopsies with tenderness at the sites. Post biopsy instructions and care were reviewed and questions were answered. The patient was encouraged to call [facility] for any additional concerns. The patient has a recent diagnosis of right breast cancer and should follow her outlined treatment plan. The patient was referred to [facility] on October 24, 2017. Pathology results reported by Terie Purser, RN on 10/23/2017. Electronically Signed   By: Curlene Dolphin M.D.   On: 10/23/2017 14:21   Result Date: 10/24/2017 CLINICAL DATA:  Ultrasound-guided biopsy was recommended of a mass in the 12 o'clock position of the right breast. The patient was recently diagnosed with invasive ductal carcinoma following biopsy of a mass in the 3:30 position of the right breast. EXAM: ULTRASOUND GUIDED RIGHT BREAST CORE NEEDLE BIOPSY COMPARISON:  Previous exam(s). FINDINGS: I met with the patient and we discussed the procedure of ultrasound-guided biopsy, including benefits and alternatives. We discussed the high likelihood  of a successful procedure. We discussed the risks of the procedure, including infection, bleeding, tissue injury, clip migration, and inadequate sampling. Informed written consent was given. The usual time-out protocol was performed immediately prior to the procedure. Lesion quadrant: Upper outer quadrant Using sterile technique and  1% Lidocaine as local anesthetic, under direct ultrasound visualization, a 12 gauge spring-loaded device was used to perform biopsy of a mass in the 12 o'clock position of the right breast using a lateral approach. At the conclusion of the procedure a coil tissue marker clip was deployed into the biopsy cavity. Follow up 2 view mammogram was performed and dictated separately. IMPRESSION: Ultrasound guided biopsy of the right breast. No apparent complications. Electronically Signed: By: Curlene Dolphin M.D. On: 10/22/2017 15:56   [place] Rt Breast Bx W Loc Dev 1st Lesion Img Bx Spec US Guide  Addendum Date: 10/18/2017   ADDENDUM REPORT: 10/17/2017 10:55 ADDENDUM: Pathology revealed GRADE III - INVASIVE DUCTAL CARCINOMA of RIGHT breast, 3:30 o'clock. This was found to be concordant by Dr. Dorise Bullion. Pathology results were discussed with the patient by telephone. The patient reported doing well after the biopsy with tenderness at the site. Post biopsy instructions and care were reviewed and questions were answered. The patient was encouraged to call [facility] for any additional concerns. The patient has been scheduled for an additional ultrasound biopsy on October 22, 2017 of RIGHT and LEFT breast for probable fat necrosis. The patient was referred to [facility] on October 24, 2017. Pathology results reported by Roselind Messier, RN on 10/17/2017. Electronically Signed   By: Dorise Bullion III M.D   On: 10/17/2017 10:55   Result Date: 10/18/2017 CLINICAL DATA:  Biopsy right breast mass  EXAM: ULTRASOUND GUIDED RIGHT BREAST CORE NEEDLE BIOPSY COMPARISON:  Previous exam(s). FINDINGS: I met with the patient and we discussed the procedure of ultrasound-guided biopsy, including benefits and alternatives. We discussed the high likelihood of a successful procedure. We discussed the risks of the procedure, including infection, bleeding, tissue injury, clip migration, and inadequate sampling. Informed written consent was given. The usual time-out protocol was performed immediately prior to the procedure. Lesion quadrant: Lower-inner Using sterile technique and 1% Lidocaine as local anesthetic, under direct ultrasound visualization, a 12 gauge spring-loaded device was used to perform biopsy of a right breast mass at 3:30 using a lateral approach. At the conclusion of the procedure a tissue marker clip was deployed into the biopsy cavity. Follow up 2 view mammogram was performed and dictated separately. IMPRESSION: Ultrasound guided biopsy of the 3:30 right breast mass. No apparent complications. Electronically Signed: By: Dorise Bullion III M.D On: 10/16/2017 15:07    ELIGIBLE FOR AVAILABLE RESEARCH PROTOCOL: no  ASSESSMENT: 67 y.o. [place], [place] woman status post right breast lower inner quadrant biopsy 10/16/2017 for a clinical T1b N0, stage IB invasive ductal carcinoma, grade 3, triple negative, with a proliferation marker of 60%  (1) definitive surgery pending, with port to be placed at the same time  (2) adjuvant chemotherapy to follow: consider TC vs CMF vs CA/T  (3) adjuvant radiation  PLAN: I spent approximately 60 minutes face to face with Keyon with more than 50% of that time spent in counseling and coordination of care. Specifically we reviewed the biology of the patient's diagnosis and the specifics of her situation. We first reviewed the fact that cancer is not one disease but more than 100 different diseases and that it is important to keep them separate-- otherwise when  friends and relatives discuss their own cancer experiences with Israa confusion can result. Similarly we explained that if breast cancer spreads to the bone or liver, the patient would not have bone cancer  or liver cancer, but breast cancer in the bone and breast cancer in the liver: one cancer in three places-- not 3 different cancers which otherwise would have to be treated in 3 different ways.  We discussed the difference between local and systemic therapy. In terms of loco-regional treatment, lumpectomy plus radiation is equivalent to mastectomy as far as survival is concerned. For this reason, and because the cosmetic results are generally superior, we recommend breast conserving surgery followed by radiation.  We then discussed the rationale for systemic therapy. There is some risk that this cancer may have already spread to other parts of her body. Patients frequently ask at this point about bone scans, CAT scans and PET scans to find out if they have occult breast cancer somewhere else. The problem is that in early stage disease we are much more likely to find false positives then true cancers and this would expose the patient to unnecessary procedures as well as unnecessary radiation. Scans cannot answer the question the patient really would like to know, which is whether she has microscopic disease elsewhere in her body. For those reasons we do not recommend them.  Of course we would proceed to aggressive evaluation of any symptoms that might suggest metastatic disease, but that is not the case here.  Next we went over the options for systemic therapy which are anti-estrogens, anti-HER-2 immunotherapy, and chemotherapy. Gaylyn does not meet criteria for anti-HER-2 immunotherapy or anti-estrogens.  Accordingly her choices between chemotherapy and no systemic treatment.  We discussed this in great detail today so that Emmilyn could make a decision regarding whether or not to have a port placed at the  time of her breast surgery. I quoted Dolly a 61 to 20% risk of this breast cancer already being present outside the breast.  This means that with local treatment only it would recur as stage IV disease at some point in the future.  She understands that stage IV breast cancer is not curable..  Because chemotherapy generally lowers risk by about one third this means her benefit from chemotherapy would be in the 5-7% range.  Another way to look at this is to say that are 100 women like her 80 to 85% would be cured with surgery alone.  Another 25 or 20 would have the cancer come back at some point and at that point the cancer would be stage IV/metastatic.  In short 5 or 6 out of the 100 women would get a very large benefit.  There would not develop metastatic disease for that to be the case all 100 women need to have chemotherapy. 93-95 of them would received no benefit from the chemotherapy treatment because most of them were ready cured with surgery alone and others had the cancer come back despite chemotherapy  With a clear understanding of these numbers Anetra had no trouble deciding that she did want chemotherapy.  Accordingly we will proceed to port placement at the time of surgery.  Chemotherapy options will include standard CA followed by T, TC, and CMF.  We will discuss these at length when she returns to see me in October of this year.  Lamyah has a good understanding of the overall plan. She agrees with it. She knows the goal of treatment in her case is cure. She will call with any problems that may develop before her next visit here.    Mouhamed Glassco, Virgie Dad, MD  10/24/17 2:21 PM Medical Oncology and Hematology [address]  [place], [place] [address] Tel. [phone]    Fax. [phone]  Alice Rieger, am acting as scribe for Chauncey Cruel MD.  I, Lurline Del MD, have reviewed the above documentation for accuracy and completeness, and I agree  with the above.

## 2017-10-24 ENCOUNTER — Ambulatory Visit: Payer: Self-pay | Admitting: General Surgery

## 2017-10-24 ENCOUNTER — Encounter: Payer: Self-pay | Admitting: Physical Therapy

## 2017-10-24 ENCOUNTER — Other Ambulatory Visit: Payer: Self-pay

## 2017-10-24 ENCOUNTER — Inpatient Hospital Stay: Payer: Medicare HMO | Attending: Oncology | Admitting: Oncology

## 2017-10-24 ENCOUNTER — Ambulatory Visit: Payer: Medicare HMO | Attending: General Surgery | Admitting: Physical Therapy

## 2017-10-24 ENCOUNTER — Encounter: Payer: Self-pay | Admitting: General Practice

## 2017-10-24 ENCOUNTER — Inpatient Hospital Stay: Payer: Medicare HMO

## 2017-10-24 ENCOUNTER — Encounter: Payer: Self-pay | Admitting: Oncology

## 2017-10-24 ENCOUNTER — Ambulatory Visit
Admission: RE | Admit: 2017-10-24 | Discharge: 2017-10-24 | Disposition: A | Discharge status: Home / Self Care | Payer: Medicare HMO | Source: Ambulatory Visit | Attending: Radiation Oncology | Admitting: Radiation Oncology

## 2017-10-24 VITALS — BP 103/48 | HR 46 | Temp 97.7°F | Resp 20 | Ht 65.0 in | Wt 259.8 lb

## 2017-10-24 DIAGNOSIS — C50311 Malignant neoplasm of lower-inner quadrant of right female breast: Secondary | ICD-10-CM | POA: Insufficient documentation

## 2017-10-24 DIAGNOSIS — Z171 Estrogen receptor negative status [ER-]: Principal | ICD-10-CM

## 2017-10-24 DIAGNOSIS — E785 Hyperlipidemia, unspecified: Secondary | ICD-10-CM | POA: Insufficient documentation

## 2017-10-24 DIAGNOSIS — Z79899 Other long term (current) drug therapy: Secondary | ICD-10-CM | POA: Insufficient documentation

## 2017-10-24 DIAGNOSIS — R293 Abnormal posture: Secondary | ICD-10-CM | POA: Insufficient documentation

## 2017-10-24 DIAGNOSIS — M25611 Stiffness of right shoulder, not elsewhere classified: Secondary | ICD-10-CM | POA: Insufficient documentation

## 2017-10-24 DIAGNOSIS — G4730 Sleep apnea, unspecified: Secondary | ICD-10-CM | POA: Diagnosis not present

## 2017-10-24 DIAGNOSIS — Z7982 Long term (current) use of aspirin: Secondary | ICD-10-CM | POA: Insufficient documentation

## 2017-10-24 DIAGNOSIS — E039 Hypothyroidism, unspecified: Secondary | ICD-10-CM | POA: Diagnosis not present

## 2017-10-24 DIAGNOSIS — Z993 Dependence on wheelchair: Secondary | ICD-10-CM | POA: Diagnosis not present

## 2017-10-24 DIAGNOSIS — G20 Parkinson's disease: Secondary | ICD-10-CM | POA: Diagnosis not present

## 2017-10-24 DIAGNOSIS — M25612 Stiffness of left shoulder, not elsewhere classified: Secondary | ICD-10-CM | POA: Insufficient documentation

## 2017-10-24 DIAGNOSIS — Z9989 Dependence on other enabling machines and devices: Secondary | ICD-10-CM | POA: Diagnosis not present

## 2017-10-24 DIAGNOSIS — Z87891 Personal history of nicotine dependence: Secondary | ICD-10-CM

## 2017-10-24 DIAGNOSIS — I10 Essential (primary) hypertension: Secondary | ICD-10-CM | POA: Diagnosis not present

## 2017-10-24 DIAGNOSIS — Z7984 Long term (current) use of oral hypoglycemic drugs: Secondary | ICD-10-CM | POA: Diagnosis not present

## 2017-10-24 DIAGNOSIS — E119 Type 2 diabetes mellitus without complications: Secondary | ICD-10-CM | POA: Diagnosis not present

## 2017-10-24 DIAGNOSIS — E1169 Type 2 diabetes mellitus with other specified complication: Secondary | ICD-10-CM

## 2017-10-24 DIAGNOSIS — E669 Obesity, unspecified: Secondary | ICD-10-CM

## 2017-10-24 DIAGNOSIS — M1990 Unspecified osteoarthritis, unspecified site: Secondary | ICD-10-CM | POA: Insufficient documentation

## 2017-10-24 LAB — CMP (CANCER CENTER ONLY)
ALT: 28 U/L (ref 0–44)
AST: 29 U/L (ref 15–41)
Albumin: 3.5 g/dL (ref 3.5–5.0)
Alkaline Phosphatase: 96 U/L (ref 38–126)
Anion gap: 7 (ref 5–15)
BUN: 36 mg/dL — AB (ref 8–23)
CHLORIDE: 111 mmol/L (ref 98–111)
CO2: 23 mmol/L (ref 22–32)
CREATININE: 1.43 mg/dL — AB (ref 0.44–1.00)
Calcium: 9.3 mg/dL (ref 8.9–10.3)
GFR, EST AFRICAN AMERICAN: 43 mL/min — AB (ref >60)
GFR, EST NON AFRICAN AMERICAN: 37 mL/min — AB (ref >60)
Glucose, Bld: 94 mg/dL (ref 70–99)
POTASSIUM: 4.8 mmol/L (ref 3.5–5.1)
Sodium: 141 mmol/L (ref 135–145)
TOTAL PROTEIN: 7 g/dL (ref 6.5–8.1)
Total Bilirubin: 0.3 mg/dL (ref 0.3–1.2)

## 2017-10-24 LAB — CBC WITH DIFFERENTIAL (CANCER CENTER ONLY)
Basophils Absolute: 0 10*3/uL (ref 0.0–0.1)
Basophils Relative: 1 %
EOS PCT: 4 %
Eosinophils Absolute: 0.2 10*3/uL (ref 0.0–0.5)
HEMATOCRIT: 38.2 % (ref 34.8–46.6)
Hemoglobin: 12.3 g/dL (ref 11.6–15.9)
LYMPHS ABS: 1.6 10*3/uL (ref 0.9–3.3)
LYMPHS PCT: 32 %
MCH: 29.1 pg (ref 25.1–34.0)
MCHC: 32.3 g/dL (ref 31.5–36.0)
MCV: 90.2 fL (ref 79.5–101.0)
MONO ABS: 0.4 10*3/uL (ref 0.1–0.9)
Monocytes Relative: 8 %
NEUTROS ABS: 2.8 10*3/uL (ref 1.5–6.5)
Neutrophils Relative %: 55 %
PLATELETS: 276 10*3/uL (ref 145–400)
RBC: 4.23 MIL/uL (ref 3.70–5.45)
RDW: 15.8 % — AB (ref 11.2–14.5)
WBC Count: 5 10*3/uL (ref 3.9–10.3)

## 2017-10-24 NOTE — Progress Notes (Signed)
Radiation Oncology         [phone] ________________________________  Multidisciplinary Breast Oncology Clinic [facility]) Initial Outpatient Consultation  Name: [name] MRN: [id number]  Date: 10/24/2017  DOB: 07-26-1950  CC:Paz, Alda Berthold, MD  Jovita Kussmaul, MD   REFERRING PHYSICIAN: Autumn Messing III, MD  DIAGNOSIS: The encounter diagnosis was Malignant neoplasm of lower-inner quadrant of right breast of female, estrogen receptor negative ([place]).   Clinical Stage T1b Nx Mx, Right Breast, LIQ, Invasive Ductal Carcinoma, ER (neg), PR (neg), HER2 (neg), grade 3  HISTORY OF PRESENT ILLNESS::[name] is a 67 y.o. female who is presenting to the office today for evaluation of her newly diagnosed breast cancer. She is doing well overall. atient presented with some bruising and palpable induration within the right breast. She also developed bruising in the left breast after her dog had jumped on her.  She underwent bilateral diagnostic mammography with tomography and bilateral breast ultrasonography due to palpable thickening and bruising in the upper left breast on 10/12/2017 showing: Suspicious 0.7 cm mass within the LOWER INNER RIGHT breast. No abnormal RIGHT axillary lymph nodes. Benign fat necrosis changes within both UPPER breasts. No other significant mammographic abnormalities.  Accordingly on 10/16/2017 she proceeded to biopsy of the right breast area in question. The pathology from this procedure showed: Breast, right, needle core biopsy, 3:30 with invasive ductal carcinoma. Prognostic indicators significant for: estrogen receptor, 0% negative and progesterone receptor, 0% negative. Proliferation marker Ki67 at 60%. HER2 negative.  She then proceeded to a right needle core biopsy on 10/22/2017 that showed: Breast, right, needle core biopsy, 12 o'clock 6 cm fn with adipose tissue with hemorrhage. No evidence of malignancy. Breast, left, needle core biopsy, 12 o'clock 4 cm fn with fat  necrosis. No evidence of malignancy.    On review of systems, She denies any other symptoms.    Menarche: 67 years old Age at first live birth: 67 years old GP: GxP1 LMP: 2001  Contraceptive: Yes HRT: No   The patient was referred today for presentation in the multidisciplinary conference.  Radiology studies and pathology slides were presented there for review and discussion of treatment options.  A consensus was discussed regarding potential next steps.  PREVIOUS RADIATION THERAPY: No  PAST MEDICAL HISTORY:  has a past medical history of Abnormal nerve conduction studies (08/2015), Bradycardia, Complication of anesthesia, Depression, Diabetes mellitus, Dyslipidemia, Goiter, Hypertension, Hypothyroidism, Morbid obesity ([place]), Osteoarthritis, PONV (postoperative nausea and vomiting), Seasonal allergies, and Sleep apnea.    PAST SURGICAL HISTORY: Past Surgical History:  Procedure Laterality Date  . APPENDECTOMY  1970's  . CARPAL TUNNEL RELEASE Right 01/2016  . COLONOSCOPY    . KNEE ARTHROSCOPY W/ MENISCECTOMY Right 2007  . TOTAL KNEE ARTHROPLASTY  08/05/07   right  . TOTAL KNEE ARTHROPLASTY Left 07/01/2013   Procedure: LEFT TOTAL KNEE ARTHROPLASTY;  Surgeon: Mauri Pole, MD;  Location: [facility];  Service: Orthopedics;  Laterality: Left;    FAMILY HISTORY: family history includes CAD in her brother; Coronary artery disease in her father; Diabetes in her mother; Esophageal cancer in her brother; Hypertension in her father; Lung cancer in her mother; Stomach cancer in her brother.  SOCIAL HISTORY:  reports that she quit smoking about 37 years ago. Her smoking use included cigarettes. She has a 5.00 pack-year smoking history. She has never used smokeless tobacco. She reports that she drinks alcohol. She reports that she does not use drugs.  ALLERGIES: Patient has no known  allergies.  MEDICATIONS:  Current Outpatient Medications  Medication Sig Dispense Refill  . ALPRAZolam (XANAX) 0.5  MG tablet Take 1 tablet (0.5 mg total) by mouth 3 (three) times daily as needed for anxiety. 90 tablet 1  . aspirin 81 MG tablet Take 81 mg by mouth daily.    . benazepril (LOTENSIN) 40 MG tablet Take 1 tablet (40 mg total) by mouth daily. 90 tablet 1  . Calcium Carbonate-Vitamin D (CALCIUM + D PO) Take 1 tablet by mouth daily.     . cetirizine (ZYRTEC) 10 MG tablet Take 10 mg by mouth daily.    . cholecalciferol (VITAMIN D) 1000 units tablet Take 2,000 Units daily by mouth.    . colchicine 0.6 MG tablet Take 1 tablet (0.6 mg total) by mouth 2 (two) times daily as needed. (Patient not taking: Reported on 08/27/2017) 1 tablet 0  . cyclobenzaprine (FLEXERIL) 10 MG tablet Take 1 tablet (10 mg total) by mouth at bedtime as needed for muscle spasms. (Patient not taking: Reported on 10/24/2017) 21 tablet 0  . gabapentin (NEURONTIN) 300 MG capsule Take 1 capsule (300 mg total) by mouth at bedtime. 90 capsule 1  . glimepiride (AMARYL) 4 MG tablet Take 1 tablet (4 mg total) by mouth daily with breakfast. 90 tablet 1  . glucose blood (ONE TOUCH ULTRA TEST) test strip Use as instructed 100 each 12  . hydrochlorothiazide (HYDRODIURIL) 25 MG tablet Take 1 tablet (25 mg total) by mouth daily. 90 tablet 1  . indomethacin (INDOCIN) 50 MG capsule Take 1 capsule (50 mg total) by mouth every 8 (eight) hours as needed. Reported on 08/13/2015 (Patient not taking: Reported on 08/27/2017) 1 capsule 0  . levothyroxine (SYNTHROID, LEVOTHROID) 150 MCG tablet Take 1 tablet (150 mcg total) by mouth daily before breakfast. 90 tablet 1  . metFORMIN (GLUCOPHAGE) 1000 MG tablet TAKE 1.5 TABLETS BY MOUTH EVERY MORNING AND 1 TABLET BY MOUTH EVERY EVENING. 225 tablet 1  . metoprolol tartrate (LOPRESSOR) 50 MG tablet Take 0.5 tablets (25 mg total) by mouth 2 (two) times daily. 90 tablet 1  . pantoprazole (PROTONIX) 40 MG tablet Take 40 mg by mouth daily.    . pioglitazone (ACTOS) 30 MG tablet Take 1 tablet (30 mg total) by mouth daily. 90  tablet 1  . rosuvastatin (CRESTOR) 10 MG tablet Take 1 tablet (10 mg total) by mouth daily. 90 tablet 1  . sertraline (ZOLOFT) 100 MG tablet Take 2 tablets (200 mg total) by mouth daily. 180 tablet 1   No current facility-administered medications for this encounter.     REVIEW OF SYSTEMS:  REVIEW OF SYSTEMS: A 10+ POINT REVIEW OF SYSTEMS WAS OBTAINED including neurology, dermatology, psychiatry, cardiac, respiratory, lymph, extremities, GI, GU, musculoskeletal, constitutional, reproductive, HEENT. All pertinent positives are noted in the HPI. All others are negative.   PHYSICAL EXAM:  Vitals with BMI 10/24/2017  Height 5' 5"   Weight 259 lbs 13 oz  BMI 17.49  Systolic 449  Diastolic 48  Pulse 46  Respirations 20   Lungs are clear to auscultation bilaterally. Heart has regular rate and rhythm. No palpable cervical, supraclavicular, or axillary adenopathy. Abdomen soft, non-tender, normal bowel sounds. Breast: Left breast large and pendulous without palpable mass, nipple discharge, or bleeding. Some bruising noted from recent biopsy in the 12 o'clock position. Right breast with bruising in the UOQ, biopsy changes in the medial aspect of the breast at approximately the 3:30 position. No palpable mass, nipple discharge, or bleeding.  KPS = 90  100 - Normal; no complaints; no evidence of disease. 90   - Able to carry on normal activity; minor signs or symptoms of disease. 80   - Normal activity with effort; some signs or symptoms of disease. 2   - Cares for self; unable to carry on normal activity or to do active work. 60   - Requires occasional assistance, but is able to care for most of his personal needs. 50   - Requires considerable assistance and frequent medical care. 63   - Disabled; requires special care and assistance. 26   - Severely disabled; hospital admission is indicated although death not imminent. 12   - Very sick; hospital admission necessary; active supportive treatment  necessary. 10   - Moribund; fatal processes progressing rapidly. 0     - Dead  Karnofsky DA, Abelmann Winifred, Craver LS and Burchenal [facility] [phone]) The use of the nitrogen mustards in the palliative treatment of carcinoma: with particular reference to bronchogenic carcinoma Cancer 1 634-56  LABORATORY DATA:  Lab Results  Component Value Date   WBC 5.0 10/24/2017   HGB 12.3 10/24/2017   HCT 38.2 10/24/2017   MCV 90.2 10/24/2017   PLT 276 10/24/2017   Lab Results  Component Value Date   NA 141 10/24/2017   K 4.8 10/24/2017   CL 111 10/24/2017   CO2 23 10/24/2017   Lab Results  Component Value Date   ALT 28 10/24/2017   AST 29 10/24/2017   ALKPHOS 96 10/24/2017   BILITOT 0.3 10/24/2017    RADIOGRAPHY: US Breast Ltd Uni Left Inc Axilla  Result Date: 10/12/2017 CLINICAL DATA:  67 year old female palpable thickening and bruising in the UPPER LEFT breast discovered on self-examination, decreased in size with bruising nearly resolved. Patient also complains of bruising in the UPPER RIGHT breast. Mild traumatic injuries to the UPPER breasts bilaterally from dog. EXAM: DIGITAL DIAGNOSTIC BILATERAL MAMMOGRAM WITH CAD AND TOMO ULTRASOUND BILATERAL BREAST COMPARISON:  Previous exam(s). ACR Breast Density Category b: There are scattered areas of fibroglandular density. FINDINGS: 2D/3D full field views of both breasts are performed. An irregular 0.7 cm mass within the INNER RIGHT breast is identified. A E 1 cm focal asymmetry with interspersed fat within the anterior UPPER RIGHT breast identified. No suspicious LEFT breast findings are noted. Mammographic images were processed with CAD. On physical exam, ecchymosis at the 12 o'clock position of the RIGHT breast 6 cm from the nipple noted. Targeted ultrasound is performed, showing the following: A 0.7 x 0.5 x 0.6 cm irregular mass at the 3:30 position of the RIGHT breast 5 cm from the nipple. A 1 x 0.8 x 0.9 cm area of fat necrosis at the 12 o'clock position  of the RIGHT breast 6 cm from the nipple, directly underlying the patient's ecchymosis. An ill-defined area of fat necrosis changes at the 12 o'clock position of the LEFT breast 4 cm from the nipple, corresponding the area of patient concern. No abnormal RIGHT axillary lymph nodes identified. IMPRESSION: 1. Suspicious 0.7 cm mass within the LOWER INNER RIGHT breast - tissue sampling recommended. 2. No abnormal RIGHT axillary lymph nodes. 3. Benign fat necrosis changes within both UPPER breasts. 4. No other significant mammographic abnormalities. RECOMMENDATION: Ultrasound-guided biopsy of 0.7 cm LOWER INNER RIGHT breast mass, which will be scheduled. I have discussed the findings and recommendations with the patient. Results were also provided in writing at the conclusion of the visit. If applicable, a reminder letter will be  sent to the patient regarding the next appointment. BI-RADS CATEGORY  4: Suspicious. Electronically Signed   By: Margarette Canada M.D.   On: 10/12/2017 14:27   US Breast Ltd Uni Right Inc Axilla  Result Date: 10/12/2017 CLINICAL DATA:  67 year old female palpable thickening and bruising in the UPPER LEFT breast discovered on self-examination, decreased in size with bruising nearly resolved. Patient also complains of bruising in the UPPER RIGHT breast. Mild traumatic injuries to the UPPER breasts bilaterally from dog. EXAM: DIGITAL DIAGNOSTIC BILATERAL MAMMOGRAM WITH CAD AND TOMO ULTRASOUND BILATERAL BREAST COMPARISON:  Previous exam(s). ACR Breast Density Category b: There are scattered areas of fibroglandular density. FINDINGS: 2D/3D full field views of both breasts are performed. An irregular 0.7 cm mass within the INNER RIGHT breast is identified. A E 1 cm focal asymmetry with interspersed fat within the anterior UPPER RIGHT breast identified. No suspicious LEFT breast findings are noted. Mammographic images were processed with CAD. On physical exam, ecchymosis at the 12 o'clock position of  the RIGHT breast 6 cm from the nipple noted. Targeted ultrasound is performed, showing the following: A 0.7 x 0.5 x 0.6 cm irregular mass at the 3:30 position of the RIGHT breast 5 cm from the nipple. A 1 x 0.8 x 0.9 cm area of fat necrosis at the 12 o'clock position of the RIGHT breast 6 cm from the nipple, directly underlying the patient's ecchymosis. An ill-defined area of fat necrosis changes at the 12 o'clock position of the LEFT breast 4 cm from the nipple, corresponding the area of patient concern. No abnormal RIGHT axillary lymph nodes identified. IMPRESSION: 1. Suspicious 0.7 cm mass within the LOWER INNER RIGHT breast - tissue sampling recommended. 2. No abnormal RIGHT axillary lymph nodes. 3. Benign fat necrosis changes within both UPPER breasts. 4. No other significant mammographic abnormalities. RECOMMENDATION: Ultrasound-guided biopsy of 0.7 cm LOWER INNER RIGHT breast mass, which will be scheduled. I have discussed the findings and recommendations with the patient. Results were also provided in writing at the conclusion of the visit. If applicable, a reminder letter will be sent to the patient regarding the next appointment. BI-RADS CATEGORY  4: Suspicious. Electronically Signed   By: Margarette Canada M.D.   On: 10/12/2017 14:27   Mm Diag Breast Tomo Bilateral  Result Date: 10/12/2017 CLINICAL DATA:  67 year old female palpable thickening and bruising in the UPPER LEFT breast discovered on self-examination, decreased in size with bruising nearly resolved. Patient also complains of bruising in the UPPER RIGHT breast. Mild traumatic injuries to the UPPER breasts bilaterally from dog. EXAM: DIGITAL DIAGNOSTIC BILATERAL MAMMOGRAM WITH CAD AND TOMO ULTRASOUND BILATERAL BREAST COMPARISON:  Previous exam(s). ACR Breast Density Category b: There are scattered areas of fibroglandular density. FINDINGS: 2D/3D full field views of both breasts are performed. An irregular 0.7 cm mass within the INNER RIGHT breast  is identified. A E 1 cm focal asymmetry with interspersed fat within the anterior UPPER RIGHT breast identified. No suspicious LEFT breast findings are noted. Mammographic images were processed with CAD. On physical exam, ecchymosis at the 12 o'clock position of the RIGHT breast 6 cm from the nipple noted. Targeted ultrasound is performed, showing the following: A 0.7 x 0.5 x 0.6 cm irregular mass at the 3:30 position of the RIGHT breast 5 cm from the nipple. A 1 x 0.8 x 0.9 cm area of fat necrosis at the 12 o'clock position of the RIGHT breast 6 cm from the nipple, directly underlying the patient's ecchymosis. An ill-defined  area of fat necrosis changes at the 12 o'clock position of the LEFT breast 4 cm from the nipple, corresponding the area of patient concern. No abnormal RIGHT axillary lymph nodes identified. IMPRESSION: 1. Suspicious 0.7 cm mass within the LOWER INNER RIGHT breast - tissue sampling recommended. 2. No abnormal RIGHT axillary lymph nodes. 3. Benign fat necrosis changes within both UPPER breasts. 4. No other significant mammographic abnormalities. RECOMMENDATION: Ultrasound-guided biopsy of 0.7 cm LOWER INNER RIGHT breast mass, which will be scheduled. I have discussed the findings and recommendations with the patient. Results were also provided in writing at the conclusion of the visit. If applicable, a reminder letter will be sent to the patient regarding the next appointment. BI-RADS CATEGORY  4: Suspicious. Electronically Signed   By: Margarette Canada M.D.   On: 10/12/2017 14:27   Mm Clip Placement Left  Result Date: 10/22/2017 CLINICAL DATA:  Ultrasound-guided biopsy was performed of a mass in the 12 o'clock position of the left breast. EXAM: DIAGNOSTIC LEFT MAMMOGRAM POST ULTRASOUND BIOPSY COMPARISON:  Previous exam(s). FINDINGS: Mammographic images were obtained following ultrasound guided biopsy of a left breast mass. A ribbon shaped biopsy clip is satisfactorily positioned in the region of  the biopsied mass in the left breast at 12 o'clock position. IMPRESSION: Satisfactory position of ribbon shaped biopsy clip in the left breast. Final Assessment: Post Procedure Mammograms for Marker Placement Electronically Signed   By: Curlene Dolphin M.D.   On: 10/22/2017 15:52   Mm Clip Placement Right  Result Date: 10/22/2017 CLINICAL DATA:  Ultrasound-guided biopsy was performed of a mass in the 12 o'clock position of the right breast. The patient recently had a mass biopsied in the 3:30 position of the right breast (with a ribbon shaped clip placed in that mass). EXAM: DIAGNOSTIC RIGHT MAMMOGRAM POST ULTRASOUND BIOPSY COMPARISON:  Previous exam(s). FINDINGS: Mammographic images were obtained following ultrasound guided biopsy of mass in the 12 o'clock position of the right breast. A coil shaped biopsy clip is satisfactorily positioned in the mass in the 12 o'clock position of the right breast. IMPRESSION: Satisfactory position of coil shaped biopsy clip in the 12 o'clock position of the right breast. Final Assessment: Post Procedure Mammograms for Marker Placement Electronically Signed   By: Curlene Dolphin M.D.   On: 10/22/2017 15:50   Mm Clip Placement Right  Result Date: 10/16/2017 CLINICAL DATA:  Evaluate biopsy marker EXAM: DIAGNOSTIC RIGHT MAMMOGRAM POST ULTRASOUND BIOPSY COMPARISON:  Previous exam(s). FINDINGS: Mammographic images were obtained following ultrasound guided biopsy of a right breast mass. The biopsy marker is within the biopsied mass. IMPRESSION: Appropriate marker placement as above. Final Assessment: Post Procedure Mammograms for Marker Placement Electronically Signed   By: Dorise Bullion III M.D   On: 10/16/2017 15:06   [place] Lt Breast Bx W Loc Dev 1st Lesion Img Bx Spec US Guide  Addendum Date: 10/24/2017   ADDENDUM REPORT: 10/23/2017 14:21 ADDENDUM: Pathology revealed ADIPOSE TISSUE WITH HEMORRHAGE of the Right breast, 12 o'clock 6 cm fn. This was found to be concordant by Dr.  Curlene Dolphin. Pathology revealed FAT NECROSIS of the Left breast, 12 o'clock 4 cm fn. This was found to be concordant by Dr. Curlene Dolphin. Pathology results were discussed with the patient by telephone. The patient reported doing well after the biopsies with tenderness at the sites. Post biopsy instructions and care were reviewed and questions were answered. The patient was encouraged to call [facility] for any additional concerns. The  patient has a recent diagnosis of right breast cancer and should follow her outlined treatment plan. The patient was referred to [facility] on October 24, 2017. Pathology results reported by Terie Purser, RN on 10/23/2017. Electronically Signed   By: Curlene Dolphin M.D.   On: 10/23/2017 14:21   Result Date: 10/24/2017 CLINICAL DATA:  Ultrasound-guided biopsy was recommended of a mass in the superficial aspect of the 12 o'clock position of the left breast. EXAM: ULTRASOUND GUIDED LEFT BREAST CORE NEEDLE BIOPSY COMPARISON:  Previous exam(s). FINDINGS: I met with the patient and we discussed the procedure of ultrasound-guided biopsy, including benefits and alternatives. We discussed the high likelihood of a successful procedure. We discussed the risks of the procedure, including infection, bleeding, tissue injury, clip migration, and inadequate sampling. Informed written consent was given. The usual time-out protocol was performed immediately prior to the procedure. Lesion quadrant: Upper outer quadrant Using sterile technique and 1% Lidocaine as local anesthetic, under direct ultrasound visualization, a 12 gauge spring-loaded device was used to perform biopsy of a mass in the 12 o'clock position of the left breast using a lateral approach. At the conclusion of the procedure a ribbon tissue marker clip was deployed into the biopsy cavity. Follow up 2 view mammogram was performed and  dictated separately. IMPRESSION: Ultrasound guided biopsy of the left breast. No apparent complications. Electronically Signed: By: Curlene Dolphin M.D. On: 10/22/2017 16:04   [place] Rt Breast Bx W Loc Dev 1st Lesion Img Bx Spec US Guide  Addendum Date: 10/24/2017   ADDENDUM REPORT: 10/23/2017 14:21 ADDENDUM: Pathology revealed ADIPOSE TISSUE WITH HEMORRHAGE of the Right breast, 12 o'clock 6 cm fn. This was found to be concordant by Dr. Curlene Dolphin. Pathology revealed FAT NECROSIS of the Left breast, 12 o'clock 4 cm fn. This was found to be concordant by Dr. Curlene Dolphin. Pathology results were discussed with the patient by telephone. The patient reported doing well after the biopsies with tenderness at the sites. Post biopsy instructions and care were reviewed and questions were answered. The patient was encouraged to call [facility] for any additional concerns. The patient has a recent diagnosis of right breast cancer and should follow her outlined treatment plan. The patient was referred to [facility] on October 24, 2017. Pathology results reported by Terie Purser, RN on 10/23/2017. Electronically Signed   By: Curlene Dolphin M.D.   On: 10/23/2017 14:21   Result Date: 10/24/2017 CLINICAL DATA:  Ultrasound-guided biopsy was recommended of a mass in the 12 o'clock position of the right breast. The patient was recently diagnosed with invasive ductal carcinoma following biopsy of a mass in the 3:30 position of the right breast. EXAM: ULTRASOUND GUIDED RIGHT BREAST CORE NEEDLE BIOPSY COMPARISON:  Previous exam(s). FINDINGS: I met with the patient and we discussed the procedure of ultrasound-guided biopsy, including benefits and alternatives. We discussed the high likelihood of a successful procedure. We discussed the risks of the procedure, including infection, bleeding, tissue injury, clip migration, and inadequate  sampling. Informed written consent was given. The usual time-out protocol was performed immediately prior to the procedure. Lesion quadrant: Upper outer quadrant Using sterile technique and 1% Lidocaine as local anesthetic, under direct ultrasound visualization, a 12 gauge spring-loaded device was used to perform biopsy of a mass in the 12 o'clock position of the right breast using a lateral  approach. At the conclusion of the procedure a coil tissue marker clip was deployed into the biopsy cavity. Follow up 2 view mammogram was performed and dictated separately. IMPRESSION: Ultrasound guided biopsy of the right breast. No apparent complications. Electronically Signed: By: Curlene Dolphin M.D. On: 10/22/2017 15:56   [place] Rt Breast Bx W Loc Dev 1st Lesion Img Bx Spec US Guide  Addendum Date: 10/18/2017   ADDENDUM REPORT: 10/17/2017 10:55 ADDENDUM: Pathology revealed GRADE III - INVASIVE DUCTAL CARCINOMA of RIGHT breast, 3:30 o'clock. This was found to be concordant by Dr. Dorise Bullion. Pathology results were discussed with the patient by telephone. The patient reported doing well after the biopsy with tenderness at the site. Post biopsy instructions and care were reviewed and questions were answered. The patient was encouraged to call [facility] for any additional concerns. The patient has been scheduled for an additional ultrasound biopsy on October 22, 2017 of RIGHT and LEFT breast for probable fat necrosis. The patient was referred to [facility] on October 24, 2017. Pathology results reported by Roselind Messier, RN on 10/17/2017. Electronically Signed   By: Dorise Bullion III M.D   On: 10/17/2017 10:55   Result Date: 10/18/2017 CLINICAL DATA:  Biopsy right breast mass EXAM: ULTRASOUND GUIDED RIGHT BREAST CORE NEEDLE BIOPSY COMPARISON:  Previous exam(s). FINDINGS: I met with the patient and we discussed the  procedure of ultrasound-guided biopsy, including benefits and alternatives. We discussed the high likelihood of a successful procedure. We discussed the risks of the procedure, including infection, bleeding, tissue injury, clip migration, and inadequate sampling. Informed written consent was given. The usual time-out protocol was performed immediately prior to the procedure. Lesion quadrant: Lower-inner Using sterile technique and 1% Lidocaine as local anesthetic, under direct ultrasound visualization, a 12 gauge spring-loaded device was used to perform biopsy of a right breast mass at 3:30 using a lateral approach. At the conclusion of the procedure a tissue marker clip was deployed into the biopsy cavity. Follow up 2 view mammogram was performed and dictated separately. IMPRESSION: Ultrasound guided biopsy of the 3:30 right breast mass. No apparent complications. Electronically Signed: By: Dorise Bullion III M.D On: 10/16/2017 15:07      IMPRESSION: Clinical Stage T1b Nx Mx, Right Breast, LIQ, Invasive Ductal Carcinoma, ER (neg), PR (neg), HER2 (neg), grade 3  Patient will be a good candidate for breast conservation with radiotherapy to right breast. The patient does wish to proceed with breast conserving surgery and not mastectomy. Given her aggressive tumor, she may benefit from adjuvant chemotherapy, but, this will be determined based on the final size of the tumor.   Today, I talked to the patient  about the findings and work-up thus far. We discussed the natural history of breast cancer and general treatment, highlighting the role of radiotherapy in the management.  We discussed the available radiation techniques, and focused on the details of logistics and delivery.  We reviewed the anticipated acute and late sequelae associated with radiation in this setting.  The patient was encouraged to ask questions that I answered to the best of my ability.    PLAN:   1. Lumpectomy and sentinel node  procedure 2. Possible adjuvant chemotherapy based on final tumor size (Triple negative tumor) 3. Adjuvant radiation therapy, the patient lives near [place] and may wish to have her radiation therapy at [facility], but she will make this decision at  a later date.     ------------------------------------------------  Blair Promise, PhD, MD   This document serves as a record of services personally performed by Gery Pray, MD. It was created on his behalf by Steva Colder, a trained medical scribe. The creation of this record is based on the scribe's personal observations and the provider's statements to them. This document has been checked and approved by the attending provider.

## 2017-10-24 NOTE — Therapy (Signed)
[place], [place], [address] Phone: [phone]   Fax:  [phone]  Physical Therapy Evaluation  Patient Details  Name: [name] MRN: [id number] Date of Birth: April 15, 1950 Referring Provider: Dr. Autumn Messing   Encounter Date: 10/24/2017  PT End of Session - 10/24/17 2052    Visit Number  1    Number of Visits  2    Date for PT Re-Evaluation  12/19/17    PT Start Time  1224    PT Stop Time  1343   Also saw pt from [phone] for a total of 30 minutes   PT Time Calculation (min)  7 min    Activity Tolerance  Patient tolerated treatment well    Behavior During Therapy  [facility] for tasks assessed/performed       Past Medical History:  Diagnosis Date  . Abnormal nerve conduction studies 08/2015   moderate R carpal tunnel syndrome   . Bradycardia   . Complication of anesthesia    HR dropped to 20  . Depression   . Diabetes mellitus    has diarrhea from medicine  . Dyslipidemia   . Goiter    h/o  . Hypertension   . Hypothyroidism    w/ h/o goiter  . Morbid obesity ([place])   . Osteoarthritis   . PONV (postoperative nausea and vomiting)   . Seasonal allergies   . Sleep apnea    on CPAP    Past Surgical History:  Procedure Laterality Date  . APPENDECTOMY  1970's  . CARPAL TUNNEL RELEASE Right 01/2016  . COLONOSCOPY    . KNEE ARTHROSCOPY W/ MENISCECTOMY Right 2007  . TOTAL KNEE ARTHROPLASTY  08/05/07   right  . TOTAL KNEE ARTHROPLASTY Left 07/01/2013   Procedure: LEFT TOTAL KNEE ARTHROPLASTY;  Surgeon: Mauri Pole, MD;  Location: [facility];  Service: Orthopedics;  Laterality: Left;    There were no vitals filed for this visit.   Subjective Assessment - 10/24/17 2041    Subjective  Patient reports she is here today to meet with her medical team for her newly diagnosed right breast cancer.    Patient is accompained by:  Family member    Pertinent History  Patient was diagnosed on 10/12/17  with right grade III triple negative invasive ductal carcinoma breast cancer.  It measures 7 mm and is located in the lower inner quadrant. The Ki67 is 60%.    Patient Stated Goals  reduce lymphedema risk and learn post op shoulder ROM HEP    Currently in Pain?  Yes    Pain Score  5     Pain Location  Shoulder    Pain Orientation  Left    Pain Descriptors / Indicators  Aching    Pain Type  Chronic pain    Pain Onset  More than a month ago    Pain Frequency  Intermittent    Aggravating Factors   AROM and weather    Pain Relieving Factors  unknown    Multiple Pain Sites  No         [facility] PT Assessment - 10/24/17 0001      Assessment   Medical Diagnosis  Right breast cancer    Referring Provider  Dr. Autumn Messing    Onset Date/Surgical Date  10/12/17    Hand Dominance  Right    Prior Therapy  none      Precautions   Precautions  Other (comment)  Precaution Comments  active cancer      Restrictions   Weight Bearing Restrictions  No      Balance Screen   Has the patient fallen in the past 6 months  No    Has the patient had a decrease in activity level because of a fear of falling?   No    Is the patient reluctant to leave their home because of a fear of falling?   No      Home Environment   Living Environment  Private residence    Living Arrangements  Spouse/significant other    Available Help at Discharge  --   None - she is the sole caretaker for her disabled husband     Prior Function   Level of [place]  Retired    Leisure  She does not exercise      Cognition   Overall Cognitive Status  Within Functional Limits for tasks assessed      Posture/Postural Control   Posture/Postural Control  Postural limitations    Postural Limitations  Rounded Shoulders;Forward head;Increased thoracic kyphosis      ROM / Strength   AROM / PROM / Strength  AROM;Strength      AROM   Overall AROM Comments  Bilateral shoulders are stiff and painful with  flexion and abduction    AROM Assessment Site  Shoulder;Cervical    Right/Left Shoulder  Right;Left    Right Shoulder Extension  40 Degrees    Right Shoulder Flexion  118 Degrees    Right Shoulder ABduction  121 Degrees    Right Shoulder Internal Rotation  66 Degrees    Right Shoulder External Rotation  66 Degrees    Left Shoulder Extension  37 Degrees    Left Shoulder Flexion  136 Degrees    Left Shoulder ABduction  103 Degrees    Left Shoulder Internal Rotation  65 Degrees    Left Shoulder External Rotation  53 Degrees    Cervical Flexion  WNL    Cervical Extension  WNL    Cervical - Right Side Bend  25% limited    Cervical - Left Side Bend  25% limited    Cervical - Right Rotation  25% limited    Cervical - Left Rotation  25% limited      Strength   Overall Strength  Within functional limits for tasks performed        LYMPHEDEMA/ONCOLOGY QUESTIONNAIRE - 10/24/17 2049      Type   Cancer Type  Right breast cancer      Lymphedema Assessments   Lymphedema Assessments  Upper extremities      Right Upper Extremity Lymphedema   10 cm Proximal to Olecranon Process  36.4 cm    Olecranon Process  27.2 cm    10 cm Proximal to Ulnar Styloid Process  24.9 cm    Just Proximal to Ulnar Styloid Process  17.4 cm    Across Hand at [employer]  20.1 cm    At [place] of 2nd Digit  6.9 cm      Left Upper Extremity Lymphedema   10 cm Proximal to Olecranon Process  35.8 cm    Olecranon Process  26.8 cm    10 cm Proximal to Ulnar Styloid Process  24.5 cm    Just Proximal to Ulnar Styloid Process  17.1 cm    Across Hand at [employer]  19.7 cm  At [facility] of 2nd Digit  6.9 cm             Objective measurements completed on examination: See above findings.    Patient was instructed today in a home exercise program today for post op shoulder range of motion. These included active assist shoulder flexion in sitting, scapular retraction, wall walking with shoulder abduction, and  hands behind head external rotation.  She was encouraged to do these twice a day, holding 3 seconds and repeating 5 times when permitted by her physician.       PT Education - 10/24/17 2051    Education Details  Lymphedema risk reduction and post op shoulder ROM HEP    Person(s) Educated  Patient;Other (comment)   sister   Methods  Explanation;Demonstration;Handout    Comprehension  Returned demonstration;Verbalized understanding          PT Long Term Goals - 10/24/17 2059      PT LONG TERM GOAL #1   Title  Patient will demonstrate she has regained shoulder ROM and function post operatively compared to baseline.    Time  [facility] Goals - 10/24/17 2058      Patient will be able to verbalize understanding of pertinent lymphedema risk reduction practices relevant to her diagnosis specifically related to skin care.   Time  1    Period  Days    Status  Achieved      Patient will be able to return demonstrate and/or verbalize understanding of the post-op home exercise program related to regaining shoulder range of motion.   Time  1    Period  Days    Status  Achieved      Patient will be able to verbalize understanding of the importance of attending the postoperative After Breast Cancer Class for further lymphedema risk reduction education and therapeutic exercise.   Time  1    Period  Days    Status  Achieved            Plan - 10/24/17 2053    Clinical Impression Statement  Patient was diagnosed on 10/12/17 with right grade III triple negative invasive ductal carcinoma breast cancer.  It measures 7 mm and is located in the lower inner quadrant. The Ki67 is 60%. Her multidisciplinary medical team met prior to her asessments to determine a recommended treatment plan. She is planning to have a right lumpectomy and sentinel node biopsy followed by chemotherapy and radiation.    History and Personal Factors relevant to plan of  care:  Little to no support at home; caregiver for completely dependent husband    Clinical Presentation  Stable    Clinical Decision Making  Low    Rehab Potential  Excellent    Clinical Impairments Affecting Rehab Potential  None    PT Frequency  --   Eval and 1 f/u visit   PT Treatment/Interventions  ADLs/Self Care Home Management;Therapeutic exercise;Patient/family education    PT Next Visit Plan  Will reassess 3-4 weeks post op to determine needs    PT Home Exercise Plan  Post op shoulder ROM HEP    Consulted and Agree with Plan of Care  Patient;Family member/caregiver    Family Member Consulted  Sister       Patient will benefit from skilled therapeutic intervention in order to improve the following deficits and impairments:  Decreased knowledge of  precautions, Impaired UE functional use, Postural dysfunction, Decreased range of motion  Visit Diagnosis: Malignant neoplasm of lower-inner quadrant of right breast of female, estrogen receptor negative ([place]) - Plan: PT plan of care cert/re-cert  Abnormal posture - Plan: PT plan of care cert/re-cert  Stiffness of left shoulder, not elsewhere classified - Plan: PT plan of care cert/re-cert  Stiffness of right shoulder, not elsewhere classified - Plan: PT plan of care cert/re-cert   Patient will follow up at outpatient cancer rehab 3-4 weeks following surgery.  If the patient requires physical therapy at that time, a specific plan will be dictated and sent to the referring physician for approval. The patient was educated today on appropriate basic range of motion exercises to begin post operatively and the importance of attending the After Breast Cancer class following surgery.  Patient was educated today on lymphedema risk reduction practices as it pertains to recommendations that will benefit the patient immediately following surgery.  She verbalized good understanding.      Problem List Patient Active Problem List   Diagnosis Date  Noted  . Malignant neoplasm of lower-inner quadrant of right breast of female, estrogen receptor negative ([place]) 10/19/2017  . Edema leg 09/12/2016  . Gout 08/14/2015  . PCP NOTES >>> 12/07/2014  . DJD (degenerative joint disease) of knee 07/01/2013  . Preoperative clearance 06/02/2013  . Trigger finger 09/27/2011  . Annual physical exam 12/29/2010  . Diabetes mellitus type 2 in obese ([place]) 01/01/2009  . Vitamin D deficiency 01/01/2009  . Obstructive sleep apnea 11/19/2008  . Anxiety and depression 10/24/2007  . DJD (degenerative joint disease) 04/24/2007  . Hypothyroidism 02/12/2007  . High cholesterol 02/12/2007  . Morbid obesity ([place]) 02/12/2007  . HTN (hypertension) 02/12/2007  . SLEEP APNEA 02/12/2007   Annia Friendly, PT 10/24/17 9:05 PM  [place], [place], [address] Phone: [phone]   Fax:  [phone]  Name: ROXI HLAVATY MRN: [id number] Date of Birth: 1950-10-29

## 2017-10-24 NOTE — Patient Instructions (Signed)

## 2017-10-25 ENCOUNTER — Telehealth: Payer: Self-pay | Admitting: Oncology

## 2017-10-25 NOTE — Telephone Encounter (Signed)
Per 8/28 no los °

## 2017-10-25 NOTE — Progress Notes (Signed)
[facility] Psychosocial Distress Screening Spiritual Care  Shadowed by counseling intern Doris Cheadle, met with Saskia and her sister [name], who drove up from [place] to attend [facility] with Kieth Brightly, in [facility] to introduce [facility] team/resources, reviewing distress screen per protocol.  The patient scored a 6 on the Psychosocial Distress Thermometer which indicates moderate distress. Also assessed for distress and other psychosocial needs.   ONCBCN DISTRESS SCREENING 10/25/2017  Screening Type Initial Screening  Distress experienced in past week (1-10) 6  Family Problem type Partner  Emotional problem type Nervousness/Anxiety  Physical Problem type Sleep/insomnia;Constipation/diarrhea  Referral to support programs Yes   Verma and [name] shared in detail about past grief in their family and the stressors associated with Aryana's full-time, hands-on caregiving for her husband [place]. Per pt, finding support for him to free her to focus on treatment is her biggest source of distress, but she is working on a plan.  Follow up needed: Yes.  [facility] Counseling Intern Doris Cheadle plans to f/u for further needs assessment and support, but please also page chaplain if immediate needs arise or circumstances change. Thank you.   [place], [place], [facility] Pager [phone] Voicemail [phone]

## 2017-10-31 ENCOUNTER — Telehealth: Payer: Self-pay | Admitting: Oncology

## 2017-10-31 ENCOUNTER — Other Ambulatory Visit: Payer: Self-pay | Admitting: Oncology

## 2017-10-31 NOTE — Telephone Encounter (Signed)
Scheduled appt per 8/28 per GM okay to schedule at specific date and time.

## 2017-11-01 ENCOUNTER — Ambulatory Visit (HOSPITAL_COMMUNITY)
Admission: RE | Admit: 2017-11-01 | Discharge: 2017-11-01 | Disposition: A | Discharge status: Home / Self Care | Payer: Medicare HMO | Source: Ambulatory Visit | Attending: Oncology | Admitting: Oncology

## 2017-11-01 ENCOUNTER — Telehealth: Payer: Self-pay | Admitting: *Deleted

## 2017-11-01 DIAGNOSIS — E039 Hypothyroidism, unspecified: Secondary | ICD-10-CM | POA: Diagnosis not present

## 2017-11-01 DIAGNOSIS — Z01818 Encounter for other preprocedural examination: Secondary | ICD-10-CM | POA: Diagnosis not present

## 2017-11-01 DIAGNOSIS — Z171 Estrogen receptor negative status [ER-]: Secondary | ICD-10-CM | POA: Diagnosis not present

## 2017-11-01 DIAGNOSIS — E1169 Type 2 diabetes mellitus with other specified complication: Secondary | ICD-10-CM | POA: Insufficient documentation

## 2017-11-01 DIAGNOSIS — Z01810 Encounter for preprocedural cardiovascular examination: Secondary | ICD-10-CM

## 2017-11-01 DIAGNOSIS — C50311 Malignant neoplasm of lower-inner quadrant of right female breast: Secondary | ICD-10-CM | POA: Diagnosis not present

## 2017-11-01 DIAGNOSIS — E669 Obesity, unspecified: Secondary | ICD-10-CM

## 2017-11-01 DIAGNOSIS — I119 Hypertensive heart disease without heart failure: Secondary | ICD-10-CM | POA: Insufficient documentation

## 2017-11-01 DIAGNOSIS — Z6841 Body Mass Index (BMI) 40.0 and over, adult: Secondary | ICD-10-CM | POA: Insufficient documentation

## 2017-11-01 DIAGNOSIS — E785 Hyperlipidemia, unspecified: Secondary | ICD-10-CM | POA: Diagnosis not present

## 2017-11-01 DIAGNOSIS — I083 Combined rheumatic disorders of mitral, aortic and tricuspid valves: Secondary | ICD-10-CM | POA: Diagnosis not present

## 2017-11-01 DIAGNOSIS — G4730 Sleep apnea, unspecified: Secondary | ICD-10-CM | POA: Diagnosis not present

## 2017-11-01 NOTE — Progress Notes (Signed)
  Echocardiogram 2D Echocardiogram has been performed.  [name] 11/01/2017, 12:18 PM

## 2017-11-01 NOTE — Telephone Encounter (Signed)
  Oncology Nurse Navigator Documentation  Navigator Location: [facility] (11/01/17 1400)   )Navigator Encounter Type: Telephone (11/01/17 1400) Telephone: Outgoing Call;Clinic/[facility] Follow-up (11/01/17 1400)                                                  Time Spent with Patient: 15 (11/01/17 1400)

## 2017-11-06 ENCOUNTER — Other Ambulatory Visit: Payer: Self-pay | Admitting: Internal Medicine

## 2017-11-08 ENCOUNTER — Other Ambulatory Visit: Payer: Self-pay | Admitting: General Surgery

## 2017-11-08 DIAGNOSIS — Z779 Other contact with and (suspected) exposures hazardous to health: Secondary | ICD-10-CM | POA: Diagnosis not present

## 2017-11-08 DIAGNOSIS — Z171 Estrogen receptor negative status [ER-]: Principal | ICD-10-CM

## 2017-11-08 DIAGNOSIS — Z01419 Encounter for gynecological examination (general) (routine) without abnormal findings: Secondary | ICD-10-CM | POA: Diagnosis not present

## 2017-11-08 DIAGNOSIS — C50311 Malignant neoplasm of lower-inner quadrant of right female breast: Secondary | ICD-10-CM

## 2017-11-12 ENCOUNTER — Other Ambulatory Visit: Payer: Self-pay

## 2017-11-12 ENCOUNTER — Telehealth: Payer: Self-pay | Admitting: *Deleted

## 2017-11-12 ENCOUNTER — Encounter (HOSPITAL_BASED_OUTPATIENT_CLINIC_OR_DEPARTMENT_OTHER): Payer: Self-pay | Admitting: *Deleted

## 2017-11-12 NOTE — Telephone Encounter (Signed)
This RN received VM from pt's sister, [name], stating issues of concern with pt's husband and his care and need to delay surgery scheduled for this Wednesday.  [name] stated - husband " is full care and cannot do anything for himself."  " he needs to go into a nursing home while [name] gets her care but he is refusing so we need some help from a Education officer, museum or somebody who can help [place] "  Above message forwarded to Iris Pert per pt seen in [facility] and need for coordination of care.  This phone note will be forwarded to our SW/Support office for pt contact and appropriate resources.

## 2017-11-13 ENCOUNTER — Telehealth: Payer: Self-pay | Admitting: *Deleted

## 2017-11-13 ENCOUNTER — Encounter: Payer: Self-pay | Admitting: General Practice

## 2017-11-13 ENCOUNTER — Encounter (HOSPITAL_BASED_OUTPATIENT_CLINIC_OR_DEPARTMENT_OTHER)
Admission: RE | Admit: 2017-11-13 | Discharge: 2017-11-13 | Disposition: A | Discharge status: Home / Self Care | Payer: Medicare HMO | Source: Ambulatory Visit | Attending: General Surgery | Admitting: General Surgery

## 2017-11-13 DIAGNOSIS — Z01810 Encounter for preprocedural cardiovascular examination: Secondary | ICD-10-CM | POA: Insufficient documentation

## 2017-11-13 NOTE — Telephone Encounter (Signed)
Called pt to discuss vm left by sister in regards to finding assistance for total care husband after surgery. Pt informed husband does not want to be placed in a nursing home. Questioned pt if he understood that she was not going to be able to care for him after sx. Pt informed "yes and no". Discussed with pt that message was sent and vm was left for SW to see if there are resources that we can help with. Received verbal understanding.

## 2017-11-13 NOTE — Progress Notes (Signed)
[facility] Spiritual Care Note  LVM of support and encouragement as f/u to [facility].    [place], [place], [facility] Pager [phone] Voicemail [phone]

## 2017-11-13 NOTE — Progress Notes (Signed)
EKG reviewed by Dr. Royce Macadamia, will proceed with surgery as scheduled.   Ensure pre surgery drink given with instructions to complete by [place], surgical soap given with instructions, pt verbalized understanding.

## 2017-11-14 ENCOUNTER — Encounter: Payer: Self-pay | Admitting: General Practice

## 2017-11-14 ENCOUNTER — Inpatient Hospital Stay: Admission: RE | Admit: 2017-11-14 | Payer: Medicare HMO | Source: Ambulatory Visit

## 2017-11-14 NOTE — Progress Notes (Signed)
[facility] CSW Progress Notes  CSW consulted w Adult Protective Services in [place], outlined situation without giving identifying information.  Per APS, husband/patient has right to refuse care and can refuse to enter a facility if he wishes.  Patient "needs to take care of herself", "if she were to go and have surgery and leave him there, she can go into hospital and call police for welfare check."  At that point APS may become involved if necessary.  As husband is an adult and "he is refusing care", patient needs to "take care of herself."  Each partner can make independent decisions re their own care.  "She needs to take care of herself."  Wife advised of the above information.    Edwyna Shell, LCSW Clinical Social Worker Phone:  [phone]

## 2017-11-14 NOTE — Progress Notes (Signed)
[facility] CSW Progress Note  Referral received from RN, patient is caring for husband at home, wants information on how to meet his care needs while she is also needing surgery herself.  Spoke w patient, husband is wheelchair bound, cannot walk, needs help w ADLs (bathing, clothing, feeding, toileting).  Needs 100% caregiving, uses hoist to move husband from recliner to wheelchair.  Uses wheelchair Lucianne Lei to transport husband.  Husband is "totally dependent on me to take care of him, I know I wont be able to do that after I have this surgery."   Has tried to admit husband to Assisted Living facility but he has refused.  "He thinks he can take care of himself."  No other agencies are currently involved in care of husband, has been involved with one "that wont cost me an arm and a leg."  Are awaiting RN in home assessment from [employer] for respite care (possible once a week support).  Wife has worked w husband's PCP to facilitate SNF placement - referral was made to SNF on [address] in [place] however husband has refused to consider leaving the home.  CSW discussed need to recontact PCP, update him on this, ask for additional resources including possible home health CSW to become involved.  CSW will contact [place] to determine if there is any elder care CSW support available to help family w this transition.  Will also refer to [place] for caregiver support.  Wife states that she is determined to follow through w her medical care needs and has her sister available to help her.  She is not able to continue to care for husband at home post surgery and during cancer treatment - if husband insists on staying home, wife will explore options of Adult Protective Services involvement in order to determine his capacity to make independent decisions in light of his limited ability to meet his needs in the home.  Referral made to [facility] via [employer] [employer].  Edwyna Shell,  LCSW Clinical Social Worker Phone:  [phone]

## 2017-11-15 ENCOUNTER — Ambulatory Visit (HOSPITAL_COMMUNITY): Payer: Medicare HMO

## 2017-11-16 ENCOUNTER — Telehealth: Payer: Self-pay | Admitting: Oncology

## 2017-11-16 NOTE — Telephone Encounter (Signed)
Appt scheduled and LMVM for patient to call back if needed per 9/18 sch msg

## 2017-11-19 ENCOUNTER — Other Ambulatory Visit: Payer: Self-pay | Admitting: Internal Medicine

## 2017-11-23 ENCOUNTER — Ambulatory Visit
Admission: RE | Admit: 2017-11-23 | Discharge: 2017-11-23 | Disposition: A | Discharge status: Home / Self Care | Payer: Medicare HMO | Source: Ambulatory Visit | Attending: General Surgery | Admitting: General Surgery

## 2017-11-23 DIAGNOSIS — C50311 Malignant neoplasm of lower-inner quadrant of right female breast: Secondary | ICD-10-CM

## 2017-11-23 DIAGNOSIS — Z171 Estrogen receptor negative status [ER-]: Principal | ICD-10-CM

## 2017-11-25 ENCOUNTER — Other Ambulatory Visit: Payer: Self-pay | Admitting: Internal Medicine

## 2017-11-26 ENCOUNTER — Ambulatory Visit (HOSPITAL_BASED_OUTPATIENT_CLINIC_OR_DEPARTMENT_OTHER)
Admission: RE | Admit: 2017-11-26 | Discharge: 2017-11-26 | Disposition: A | Discharge status: Home / Self Care | Payer: Medicare HMO | Source: Ambulatory Visit | Attending: General Surgery | Admitting: General Surgery

## 2017-11-26 ENCOUNTER — Encounter (HOSPITAL_BASED_OUTPATIENT_CLINIC_OR_DEPARTMENT_OTHER)
Admission: RE | Disposition: A | Discharge status: Home / Self Care | Payer: Self-pay | Source: Ambulatory Visit | Attending: General Surgery

## 2017-11-26 ENCOUNTER — Ambulatory Visit (HOSPITAL_COMMUNITY): Payer: Medicare HMO

## 2017-11-26 ENCOUNTER — Other Ambulatory Visit: Payer: Self-pay

## 2017-11-26 ENCOUNTER — Ambulatory Visit (HOSPITAL_BASED_OUTPATIENT_CLINIC_OR_DEPARTMENT_OTHER): Payer: Medicare HMO | Admitting: Anesthesiology

## 2017-11-26 ENCOUNTER — Ambulatory Visit
Admission: RE | Admit: 2017-11-26 | Discharge: 2017-11-26 | Disposition: A | Discharge status: Home / Self Care | Payer: Medicare HMO | Source: Ambulatory Visit | Attending: General Surgery | Admitting: General Surgery

## 2017-11-26 ENCOUNTER — Inpatient Hospital Stay: Payer: Medicare HMO | Admitting: Oncology

## 2017-11-26 ENCOUNTER — Encounter (HOSPITAL_BASED_OUTPATIENT_CLINIC_OR_DEPARTMENT_OTHER): Payer: Self-pay | Admitting: *Deleted

## 2017-11-26 ENCOUNTER — Encounter (HOSPITAL_COMMUNITY)
Admission: RE | Admit: 2017-11-26 | Discharge: 2017-11-26 | Disposition: A | Discharge status: Home / Self Care | Payer: Medicare HMO | Source: Ambulatory Visit | Attending: General Surgery | Admitting: General Surgery

## 2017-11-26 DIAGNOSIS — G4733 Obstructive sleep apnea (adult) (pediatric): Secondary | ICD-10-CM | POA: Diagnosis not present

## 2017-11-26 DIAGNOSIS — G4730 Sleep apnea, unspecified: Secondary | ICD-10-CM | POA: Insufficient documentation

## 2017-11-26 DIAGNOSIS — Z7982 Long term (current) use of aspirin: Secondary | ICD-10-CM | POA: Insufficient documentation

## 2017-11-26 DIAGNOSIS — Z171 Estrogen receptor negative status [ER-]: Principal | ICD-10-CM

## 2017-11-26 DIAGNOSIS — Z6841 Body Mass Index (BMI) 40.0 and over, adult: Secondary | ICD-10-CM | POA: Diagnosis not present

## 2017-11-26 DIAGNOSIS — M1990 Unspecified osteoarthritis, unspecified site: Secondary | ICD-10-CM | POA: Diagnosis not present

## 2017-11-26 DIAGNOSIS — Z87891 Personal history of nicotine dependence: Secondary | ICD-10-CM | POA: Insufficient documentation

## 2017-11-26 DIAGNOSIS — G8918 Other acute postprocedural pain: Secondary | ICD-10-CM | POA: Diagnosis not present

## 2017-11-26 DIAGNOSIS — Z79899 Other long term (current) drug therapy: Secondary | ICD-10-CM | POA: Insufficient documentation

## 2017-11-26 DIAGNOSIS — F329 Major depressive disorder, single episode, unspecified: Secondary | ICD-10-CM | POA: Diagnosis not present

## 2017-11-26 DIAGNOSIS — E119 Type 2 diabetes mellitus without complications: Secondary | ICD-10-CM | POA: Insufficient documentation

## 2017-11-26 DIAGNOSIS — I10 Essential (primary) hypertension: Secondary | ICD-10-CM | POA: Diagnosis not present

## 2017-11-26 DIAGNOSIS — E669 Obesity, unspecified: Secondary | ICD-10-CM | POA: Insufficient documentation

## 2017-11-26 DIAGNOSIS — Z7984 Long term (current) use of oral hypoglycemic drugs: Secondary | ICD-10-CM | POA: Insufficient documentation

## 2017-11-26 DIAGNOSIS — Z79890 Hormone replacement therapy (postmenopausal): Secondary | ICD-10-CM | POA: Diagnosis not present

## 2017-11-26 DIAGNOSIS — C50911 Malignant neoplasm of unspecified site of right female breast: Secondary | ICD-10-CM | POA: Diagnosis not present

## 2017-11-26 DIAGNOSIS — E7800 Pure hypercholesterolemia, unspecified: Secondary | ICD-10-CM | POA: Diagnosis not present

## 2017-11-26 DIAGNOSIS — Z4682 Encounter for fitting and adjustment of non-vascular catheter: Secondary | ICD-10-CM | POA: Diagnosis not present

## 2017-11-26 DIAGNOSIS — C50311 Malignant neoplasm of lower-inner quadrant of right female breast: Secondary | ICD-10-CM | POA: Diagnosis not present

## 2017-11-26 DIAGNOSIS — E039 Hypothyroidism, unspecified: Secondary | ICD-10-CM | POA: Diagnosis not present

## 2017-11-26 DIAGNOSIS — R928 Other abnormal and inconclusive findings on diagnostic imaging of breast: Secondary | ICD-10-CM | POA: Diagnosis not present

## 2017-11-26 DIAGNOSIS — Z95828 Presence of other vascular implants and grafts: Secondary | ICD-10-CM

## 2017-11-26 DIAGNOSIS — R69 Illness, unspecified: Secondary | ICD-10-CM | POA: Diagnosis not present

## 2017-11-26 HISTORY — PX: BREAST LUMPECTOMY WITH RADIOACTIVE SEED AND SENTINEL LYMPH NODE BIOPSY: SHX6550

## 2017-11-26 HISTORY — PX: PORTACATH PLACEMENT: SHX2246

## 2017-11-26 LAB — GLUCOSE, CAPILLARY
GLUCOSE-CAPILLARY: 131 mg/dL — AB (ref 70–99)
Glucose-Capillary: 109 mg/dL — ABNORMAL HIGH (ref 70–99)

## 2017-11-26 SURGERY — BREAST LUMPECTOMY WITH RADIOACTIVE SEED AND SENTINEL LYMPH NODE BIOPSY
Anesthesia: General | Site: Chest | Laterality: Right

## 2017-11-26 MED ORDER — EPHEDRINE 5 MG/ML INJ
INTRAVENOUS | Status: AC
  Filled 2017-11-26: qty 10

## 2017-11-26 MED ORDER — HEPARIN (PORCINE) IN NACL 1000-0.9 UT/500ML-% IV SOLN
INTRAVENOUS | Status: AC
  Filled 2017-11-26: qty 500

## 2017-11-26 MED ORDER — GABAPENTIN 300 MG PO CAPS
ORAL_CAPSULE | ORAL | Status: AC
  Filled 2017-11-26: qty 1

## 2017-11-26 MED ORDER — ONDANSETRON HCL 4 MG/2ML IJ SOLN: 4.0000 mg | Freq: Once | INTRAMUSCULAR | Status: DC | PRN

## 2017-11-26 MED ORDER — OXYCODONE HCL 5 MG PO TABS
ORAL_TABLET | ORAL | Status: AC
  Filled 2017-11-26: qty 1

## 2017-11-26 MED ORDER — BUPIVACAINE-EPINEPHRINE (PF) 0.25% -1:200000 IJ SOLN
INTRAMUSCULAR | Status: AC
  Filled 2017-11-26: qty 60

## 2017-11-26 MED ORDER — ACETAMINOPHEN 500 MG PO TABS
ORAL_TABLET | ORAL | Status: AC
  Filled 2017-11-26: qty 2

## 2017-11-26 MED ORDER — PHENYLEPHRINE 40 MCG/ML (10ML) SYRINGE FOR IV PUSH (FOR BLOOD PRESSURE SUPPORT)
PREFILLED_SYRINGE | INTRAVENOUS | Status: AC
  Filled 2017-11-26: qty 10

## 2017-11-26 MED ORDER — LIDOCAINE 2% (20 MG/ML) 5 ML SYRINGE
INTRAMUSCULAR | Status: AC
  Filled 2017-11-26: qty 5

## 2017-11-26 MED ORDER — BUPIVACAINE HCL (PF) 0.25 % IJ SOLN
INTRAMUSCULAR | Status: DC | PRN
  Administered 2017-11-26: 30 mL

## 2017-11-26 MED ORDER — DEXAMETHASONE SODIUM PHOSPHATE 4 MG/ML IJ SOLN
INTRAMUSCULAR | Status: DC | PRN
  Administered 2017-11-26: 5 mg via INTRAVENOUS

## 2017-11-26 MED ORDER — SCOPOLAMINE 1 MG/3DAYS TD PT72
1.0000 | MEDICATED_PATCH | Freq: Once | TRANSDERMAL | Status: DC | PRN
  Administered 2017-11-26: 1.5 mg via TRANSDERMAL

## 2017-11-26 MED ORDER — CHLORHEXIDINE GLUCONATE CLOTH 2 % EX PADS: 6.0000 | MEDICATED_PAD | Freq: Once | CUTANEOUS | Status: DC

## 2017-11-26 MED ORDER — FENTANYL CITRATE (PF) 100 MCG/2ML IJ SOLN
INTRAMUSCULAR | Status: AC
  Filled 2017-11-26: qty 2

## 2017-11-26 MED ORDER — CEFAZOLIN SODIUM-DEXTROSE 2-4 GM/100ML-% IV SOLN
2.0000 g | INTRAVENOUS | Status: AC
  Administered 2017-11-26: 2 g via INTRAVENOUS

## 2017-11-26 MED ORDER — MIDAZOLAM HCL 2 MG/2ML IJ SOLN
INTRAMUSCULAR | Status: AC
  Filled 2017-11-26: qty 2

## 2017-11-26 MED ORDER — GLYCOPYRROLATE 0.2 MG/ML IJ SOLN
INTRAMUSCULAR | Status: DC | PRN
  Administered 2017-11-26 (×2): 0.2 mg via INTRAVENOUS

## 2017-11-26 MED ORDER — OXYCODONE HCL 5 MG PO TABS
5.0000 mg | ORAL_TABLET | Freq: Once | ORAL | Status: AC
  Administered 2017-11-26: 5 mg via ORAL

## 2017-11-26 MED ORDER — PROPOFOL 10 MG/ML IV BOLUS
INTRAVENOUS | Status: DC | PRN
  Administered 2017-11-26: 130 mg via INTRAVENOUS

## 2017-11-26 MED ORDER — LACTATED RINGERS IV SOLN
INTRAVENOUS | Status: DC
  Administered 2017-11-26: 07:00:00 via INTRAVENOUS

## 2017-11-26 MED ORDER — SCOPOLAMINE 1 MG/3DAYS TD PT72
MEDICATED_PATCH | TRANSDERMAL | Status: AC
  Filled 2017-11-26: qty 1

## 2017-11-26 MED ORDER — FENTANYL CITRATE (PF) 100 MCG/2ML IJ SOLN
INTRAMUSCULAR | Status: DC | PRN
  Administered 2017-11-26: 50 ug via INTRAVENOUS
  Administered 2017-11-26: 25 ug via INTRAVENOUS

## 2017-11-26 MED ORDER — CELECOXIB 200 MG PO CAPS
200.0000 mg | ORAL_CAPSULE | ORAL | Status: AC
  Administered 2017-11-26: 200 mg via ORAL

## 2017-11-26 MED ORDER — HEPARIN (PORCINE) IN NACL 2-0.9 UNITS/ML
INTRAMUSCULAR | Status: AC | PRN
  Administered 2017-11-26: 1

## 2017-11-26 MED ORDER — METHYLENE BLUE 0.5 % INJ SOLN
INTRAVENOUS | Status: AC
  Filled 2017-11-26: qty 10

## 2017-11-26 MED ORDER — HEPARIN SOD (PORK) LOCK FLUSH 100 UNIT/ML IV SOLN
INTRAVENOUS | Status: DC | PRN
  Administered 2017-11-26: 500 [IU]

## 2017-11-26 MED ORDER — GABAPENTIN 300 MG PO CAPS
300.0000 mg | ORAL_CAPSULE | ORAL | Status: AC
  Administered 2017-11-26: 300 mg via ORAL

## 2017-11-26 MED ORDER — FENTANYL CITRATE (PF) 100 MCG/2ML IJ SOLN
50.0000 ug | INTRAMUSCULAR | Status: DC | PRN
  Administered 2017-11-26: 50 ug via INTRAVENOUS

## 2017-11-26 MED ORDER — ONDANSETRON HCL 4 MG/2ML IJ SOLN
INTRAMUSCULAR | Status: AC
  Filled 2017-11-26: qty 2

## 2017-11-26 MED ORDER — HEPARIN SOD (PORK) LOCK FLUSH 100 UNIT/ML IV SOLN
INTRAVENOUS | Status: AC
  Filled 2017-11-26: qty 5

## 2017-11-26 MED ORDER — VASOPRESSIN 20 UNIT/ML IV SOLN
INTRAVENOUS | Status: DC | PRN
  Administered 2017-11-26 (×4): 1 [IU] via INTRAVENOUS

## 2017-11-26 MED ORDER — DEXAMETHASONE SODIUM PHOSPHATE 10 MG/ML IJ SOLN
INTRAMUSCULAR | Status: AC
  Filled 2017-11-26: qty 1

## 2017-11-26 MED ORDER — TECHNETIUM TC 99M SULFUR COLLOID FILTERED
1.0000 | Freq: Once | INTRAVENOUS | Status: AC | PRN
  Administered 2017-11-26: 1 via INTRADERMAL

## 2017-11-26 MED ORDER — HYDROCODONE-ACETAMINOPHEN 5-325 MG PO TABS: 1.0000 | ORAL_TABLET | Freq: Four times a day (QID) | ORAL | 0 refills | Status: AC | PRN

## 2017-11-26 MED ORDER — LIDOCAINE HCL (CARDIAC) PF 100 MG/5ML IV SOSY
PREFILLED_SYRINGE | INTRAVENOUS | Status: DC | PRN
  Administered 2017-11-26: 80 mg via INTRAVENOUS

## 2017-11-26 MED ORDER — CEFAZOLIN SODIUM-DEXTROSE 2-4 GM/100ML-% IV SOLN
INTRAVENOUS | Status: AC
  Filled 2017-11-26: qty 100

## 2017-11-26 MED ORDER — EPHEDRINE SULFATE 50 MG/ML IJ SOLN
INTRAMUSCULAR | Status: DC | PRN
  Administered 2017-11-26 (×2): 15 mg via INTRAVENOUS
  Administered 2017-11-26: 10 mg via INTRAVENOUS

## 2017-11-26 MED ORDER — CELECOXIB 200 MG PO CAPS
ORAL_CAPSULE | ORAL | Status: AC
  Filled 2017-11-26: qty 1

## 2017-11-26 MED ORDER — LIDOCAINE HCL (PF) 1 % IJ SOLN
INTRAMUSCULAR | Status: AC
  Filled 2017-11-26: qty 30

## 2017-11-26 MED ORDER — MIDAZOLAM HCL 5 MG/5ML IJ SOLN
INTRAMUSCULAR | Status: DC | PRN
  Administered 2017-11-26: 1 mg via INTRAVENOUS

## 2017-11-26 MED ORDER — SODIUM CHLORIDE 0.9 % IJ SOLN
INTRAMUSCULAR | Status: AC
  Filled 2017-11-26: qty 10

## 2017-11-26 MED ORDER — ACETAMINOPHEN 500 MG PO TABS
1000.0000 mg | ORAL_TABLET | ORAL | Status: AC
  Administered 2017-11-26: 1000 mg via ORAL

## 2017-11-26 MED ORDER — SUCCINYLCHOLINE CHLORIDE 200 MG/10ML IV SOSY
PREFILLED_SYRINGE | INTRAVENOUS | Status: AC
  Filled 2017-11-26: qty 10

## 2017-11-26 MED ORDER — FENTANYL CITRATE (PF) 100 MCG/2ML IJ SOLN
25.0000 ug | INTRAMUSCULAR | Status: DC | PRN
  Administered 2017-11-26 (×2): 25 ug via INTRAVENOUS

## 2017-11-26 MED ORDER — BUPIVACAINE HCL (PF) 0.25 % IJ SOLN
INTRAMUSCULAR | Status: AC
  Filled 2017-11-26: qty 60

## 2017-11-26 MED ORDER — MIDAZOLAM HCL 2 MG/2ML IJ SOLN: 1.0000 mg | INTRAMUSCULAR | Status: DC | PRN

## 2017-11-26 SURGICAL SUPPLY — 65 items
ADH SKN CLS APL DERMABOND .7 (GAUZE/BANDAGES/DRESSINGS) ×4
APPLIER CLIP 9.375 MED OPEN (MISCELLANEOUS) ×4
APR CLP MED 9.3 20 MLT OPN (MISCELLANEOUS) ×2
BAG DECANTER FOR FLEXI CONT (MISCELLANEOUS) ×4 IMPLANT
BLADE SURG 15 STRL LF DISP TIS (BLADE) ×2 IMPLANT
BLADE SURG 15 STRL SS (BLADE) ×4
CANISTER SUC SOCK COL 7IN (MISCELLANEOUS) IMPLANT
CANISTER SUCT 1200ML W/VALVE (MISCELLANEOUS) IMPLANT
CHLORAPREP W/TINT 26ML (MISCELLANEOUS) ×6 IMPLANT
CLEANER CAUTERY TIP 5X5 PAD (MISCELLANEOUS) ×2 IMPLANT
CLIP APPLIE 9.375 MED OPEN (MISCELLANEOUS) ×2 IMPLANT
COVER BACK TABLE 60X90IN (DRAPES) ×4 IMPLANT
COVER MAYO STAND STRL (DRAPES) ×4 IMPLANT
COVER PROBE W GEL 5X96 (DRAPES) ×4 IMPLANT
DECANTER SPIKE VIAL GLASS SM (MISCELLANEOUS) IMPLANT
DERMABOND ADVANCED (GAUZE/BANDAGES/DRESSINGS) ×4
DERMABOND ADVANCED .7 DNX12 (GAUZE/BANDAGES/DRESSINGS) ×2 IMPLANT
DEVICE DUBIN W/COMP PLATE 8390 (MISCELLANEOUS) ×4 IMPLANT
DRAPE C-ARM 42X72 X-RAY (DRAPES) ×4 IMPLANT
DRAPE LAPAROSCOPIC ABDOMINAL (DRAPES) ×6 IMPLANT
DRAPE UTILITY XL STRL (DRAPES) ×6 IMPLANT
ELECT COATED BLADE 2.86 ST (ELECTRODE) ×4 IMPLANT
ELECT REM PT RETURN 9FT ADLT (ELECTROSURGICAL) ×4
ELECTRODE REM PT RTRN 9FT ADLT (ELECTROSURGICAL) ×2 IMPLANT
GLOVE BIO SURGEON STRL SZ 6.5 (GLOVE) ×4 IMPLANT
GLOVE BIO SURGEON STRL SZ7.5 (GLOVE) ×8 IMPLANT
GLOVE BIO SURGEONS STRL SZ 6.5 (GLOVE) ×4
GLOVE BIOGEL PI IND STRL 7.0 (GLOVE) IMPLANT
GLOVE BIOGEL PI INDICATOR 7.0 (GLOVE) ×6
GOWN STRL REUS W/ TWL LRG LVL3 (GOWN DISPOSABLE) ×4 IMPLANT
GOWN STRL REUS W/TWL LRG LVL3 (GOWN DISPOSABLE) ×24
ILLUMINATOR WAVEGUIDE N/F (MISCELLANEOUS) IMPLANT
IV KIT MINILOC 20X1 SAFETY (NEEDLE) IMPLANT
KIT MARKER MARGIN INK (KITS) ×4 IMPLANT
KIT PORT POWER 8FR ISP CVUE (Port) ×2 IMPLANT
LIGHT WAVEGUIDE WIDE FLAT (MISCELLANEOUS) IMPLANT
NDL HYPO 25X1 1.5 SAFETY (NEEDLE) ×2 IMPLANT
NDL SAFETY ECLIPSE 18X1.5 (NEEDLE) IMPLANT
NDL SPNL 22GX3.5 QUINCKE BK (NEEDLE) IMPLANT
NEEDLE HYPO 18GX1.5 SHARP (NEEDLE)
NEEDLE HYPO 22GX1.5 SAFETY (NEEDLE) IMPLANT
NEEDLE HYPO 25X1 1.5 SAFETY (NEEDLE) ×4 IMPLANT
NEEDLE SPNL 22GX3.5 QUINCKE BK (NEEDLE) IMPLANT
NS IRRIG 1000ML POUR BTL (IV SOLUTION) IMPLANT
PACK BASIN DAY SURGERY FS (CUSTOM PROCEDURE TRAY) ×4 IMPLANT
PAD CLEANER CAUTERY TIP 5X5 (MISCELLANEOUS)
PENCIL BUTTON HOLSTER BLD 10FT (ELECTRODE) ×4 IMPLANT
SLEEVE SCD COMPRESS KNEE MED (MISCELLANEOUS) ×4 IMPLANT
SPONGE LAP 18X18 RF (DISPOSABLE) ×6 IMPLANT
SUT MON AB 4-0 PC3 18 (SUTURE) ×8 IMPLANT
SUT PROLENE 2 0 SH DA (SUTURE) ×4 IMPLANT
SUT SILK 2 0 SH (SUTURE) IMPLANT
SUT SILK 2 0 TIES 17X18 (SUTURE)
SUT SILK 2-0 18XBRD TIE BLK (SUTURE) IMPLANT
SUT VIC AB 3-0 SH 27 (SUTURE)
SUT VIC AB 3-0 SH 27X BRD (SUTURE) ×2 IMPLANT
SUT VICRYL 3-0 CR8 SH (SUTURE) ×4 IMPLANT
SYR 10ML LL (SYRINGE) ×2 IMPLANT
SYR 5ML LL (SYRINGE) ×4 IMPLANT
SYR CONTROL 10ML LL (SYRINGE) ×6 IMPLANT
TOWEL GREEN STERILE FF (TOWEL DISPOSABLE) ×8 IMPLANT
TOWEL OR NON WOVEN STRL DISP B (DISPOSABLE) ×2 IMPLANT
TUBE CONNECTING 20'X1/4 (TUBING) ×1
TUBE CONNECTING 20X1/4 (TUBING) ×1 IMPLANT
YANKAUER SUCT BULB TIP NO VENT (SUCTIONS) ×2 IMPLANT

## 2017-11-26 NOTE — Anesthesia Procedure Notes (Signed)
Procedure Name: LMA Insertion Date/Time: 11/26/2017 8:29 AM Performed by: Willa Frater, CRNA Pre-anesthesia Checklist: Patient identified, Emergency Drugs available, Suction available and Patient being monitored Patient Re-evaluated:Patient Re-evaluated prior to induction Oxygen Delivery Method: Circle system utilized Preoxygenation: Pre-oxygenation with 100% oxygen Induction Type: IV induction Ventilation: Mask ventilation without difficulty LMA: LMA inserted LMA Size: 4.0 Number of attempts: 1 Airway Equipment and Method: Bite block Placement Confirmation: positive ETCO2 Tube secured with: Tape Dental Injury: Teeth and Oropharynx as per pre-operative assessment

## 2017-11-26 NOTE — Progress Notes (Signed)
Assisted Dr. Gifford Shave with right, ultrasound guided, pectoralis block. Side rails up, monitors on throughout procedure. See vital signs in flow sheet. Tolerated Procedure well.

## 2017-11-26 NOTE — Op Note (Signed)
11/26/2017  10:34 AM  PATIENT:  [name]  67 y.o. female  PRE-OPERATIVE DIAGNOSIS:  RIGHT BREAST CANCER  POST-OPERATIVE DIAGNOSIS:  RIGHT BREAST CANCER  PROCEDURE:  Procedure(s): BREAST LUMPECTOMY WITH RADIOACTIVE SEED LOCALIZATION AND DEEP RIGHT AXILLARY SENTINEL LYMPH NODE BIOPSY (Right) INSERTION PORT-A-CATH (Left)  SURGEON:  Surgeon(s) and Role:    * Jovita Kussmaul, MD - Primary    * Gosai, Puja, PA-C - Assisting  PHYSICIAN ASSISTANT:   ASSISTANTS: none   ANESTHESIA:   local and general  EBL:  25 mL   BLOOD ADMINISTERED:none  DRAINS: none   LOCAL MEDICATIONS USED:  MARCAINE     SPECIMEN:  Source of Specimen:  right breast tissue and sentinel node  DISPOSITION OF SPECIMEN:  PATHOLOGY  COUNTS:  YES  TOURNIQUET:  * No tourniquets in log *  DICTATION: .Dragon Dictation   After informed consent was obtained the patient was brought to the operating room and placed in the supine position on the operating table.  After adequate induction of general anesthesia a roll was placed between the patient's shoulder blades to extend the shoulder slightly.  The left chest wall and neck area were then prepped with ChloraPrep, allowed to dry, and draped in usual sterile manner.  An appropriate timeout was performed.  The patient was placed in Trendelenburg position.  The area lateral to the bend of the clavicle on the left chest wall was infiltrated with quarter percent Marcaine.  A large bore needle from the Port-A-Cath kit was used to slide beneath the bend of the clavicle heading towards the sternal notch and in doing so I was able to access the left subclavian vein without difficulty.  A wire was fed through the needle using the Seldinger technique without difficulty.  The wire was confirmed in the central venous system using real-time fluoroscopy.  Next a small incision was made at the wire entry site on the left chest wall with a 15 blade knife.  The incision was carried  through the skin and subcutaneous tissue sharply with electrocautery.  A subcutaneous pocket was then created inferior to the incision with blunt finger dissection.  Next the tubing was placed on the reservoir.  The reservoir was placed in the pocket and the length of the tubing was estimated using real-time fluoroscopy.  The tubing was cut to the appropriate length.  Next a sheath and dilator were fed over the wire also using the Seldinger technique without difficulty.  The dilator and wire were removed from the patient.  The tubing was fed through the sheath as far as it would go and then held in place while the sheath was gently cracked and separated.  Another real-time fluoroscopy image showed the tip of the catheter being in the distal superior vena cava.  The tubing was then permanently anchored to the reservoir.  The reservoir was anchored in the pocket with two 2-0 Prolene stitches.  The port was then aspirated and it aspirated blood easily.  The port was then flushed initially with a dilute heparin solution and then with a more concentrated heparin solution.  The subcutaneous tissue was closed over the port with interrupted 3-0 Vicryl stitches.  The skin was then closed with a running 4-0 Monocryl subcuticular stitch.  Dermabond dressings were applied.  The patient tolerated the procedure well.  The drapes were removed and the arms were placed out on arm boards.  The right chest, breast, and axillary area were then prepped with ChloraPrep, allowed  to dry, and draped in usual sterile manner.  An appropriate timeout was performed.  Previously an I-125 seed was placed in the lower inner quadrant of the right breast to mark an area of invasive breast cancer.  Earlier in the day the patient underwent injection of 1 mCi of technetium sulfur colloid in the subareolar position on the right.  The neoprobe was set to technetium in an area of radioactivity was readily identified in the right axilla.  This area was  infiltrated with quarter percent Marcaine.  A small transversely oriented incision was made with a 15 blade knife overlying the area of radioactivity.  The incision was carried through the skin and subcutaneous tissue sharply with electrocautery until the deep right axillary space was entered.  Blunt dissection was carried out under the direction of the neoprobe.  I was able to identify at least one lymph node with increased radioactivity but it was within what looked like a cluster of several nodes.  The entire cluster was removed sharply with electrocautery and the lymphatics were controlled with clips.  Ex vivo counts on the sentinel node were approximately 400.  No other hot or palpable nodes were identified in the right axilla.  A small vessel was also controlled with a 2-0 Vicryl suture ligature.  Hemostasis was achieved using the Bovie electrocautery.  The deep layer of the wound was then closed with interrupted 3-0 Vicryl stitches.  The skin was then closed with a running 4-0 Monocryl subcuticular stitch.  Attention was then turned to the right breast.  The neoprobe was set to I-125 in the area of radioactivity was readily identified.  The area around this was infiltrated with quarter percent Marcaine.  A curvilinear incision was made along the edge of the areola of the lower inner right breast with a 15 blade knife.  The incision was carried through the skin and subcutaneous tissue sharply with electrocautery.  The dissection was then carried towards the radioactive seed under the direction of the neoprobe.  Once I more closely approached the radioactive seed I then removed a circular portion of breast tissue sharply with the electrocautery around the radioactive seed while checking the area of radioactivity frequently.  Once the specimen was removed it was oriented with the appropriate paint colors.  A specimen radiograph was obtained that showed the clip and seed to be near the center of the specimen.   Specimen was then sent to pathology for further evaluation.  Hemostasis was achieved using the Bovie electrocautery.  The wound was irrigated with saline and infiltrated with more quarter percent Marcaine.  The cavity was marked with clips.  The deep layer of the wound was then closed with layers of interrupted 3-0 Vicryl stitches.  The skin was then closed with interrupted 4-0 Monocryl subcuticular stitches.  Dermabond dressings were applied.  The patient tolerated the procedure well.  At the end of the case all needle sponge and instrument counts were correct.  The patient was then awakened and taken to recovery in stable condition.  PLAN OF CARE: Discharge to home after PACU  PATIENT DISPOSITION:  PACU - hemodynamically stable.   Delay start of Pharmacological VTE agent (>24hrs) due to surgical blood loss or risk of bleeding: not applicable

## 2017-11-26 NOTE — Transfer of Care (Signed)
Immediate Anesthesia Transfer of Care Note  Patient: [name]  Procedure(s) Performed: BREAST LUMPECTOMY WITH RADIOACTIVE SEED AND SENTINEL LYMPH NODE BIOPSY (Right Breast) INSERTION PORT-A-CATH (Left Chest)  Patient Location: PACU  Anesthesia Type:General  Level of Consciousness: sedated  Airway & Oxygen Therapy: Patient Spontanous Breathing and Patient connected to face mask oxygen  Post-op Assessment: Report given to RN and Post -op Vital signs reviewed and stable  Post vital signs: Reviewed and stable  Last Vitals:  Vitals Value Taken Time  BP 133/53 11/26/2017 10:47 AM  Temp    Pulse 50 11/26/2017 10:51 AM  Resp 21 11/26/2017 10:51 AM  SpO2 100 % 11/26/2017 10:51 AM  Vitals shown include unvalidated device data.  Last Pain:  Vitals:   11/26/17 0643  TempSrc: Oral  PainSc: 0-No pain      Patients Stated Pain Goal: 0 (45/03/88 8280)  Complications: No apparent anesthesia complications

## 2017-11-26 NOTE — Anesthesia Postprocedure Evaluation (Signed)
Anesthesia Post Note  Patient: VARETTA CHAVERS  Procedure(s) Performed: BREAST LUMPECTOMY WITH RADIOACTIVE SEED AND SENTINEL LYMPH NODE BIOPSY (Right Breast) INSERTION PORT-A-CATH (Left Chest)     Patient location during evaluation: PACU Anesthesia Type: General Level of consciousness: awake and alert and oriented Pain management: pain level controlled Vital Signs Assessment: post-procedure vital signs reviewed and stable Respiratory status: spontaneous breathing, nonlabored ventilation and respiratory function stable Cardiovascular status: blood pressure returned to baseline and stable Postop Assessment: no apparent nausea or vomiting Anesthetic complications: no    Last Vitals:  Vitals:   11/26/17 1115 11/26/17 1130  BP: (!) 144/49 (!) 140/55  Pulse: (!) 48 (!) 47  Resp: 14 19  Temp:    SpO2: 98% 100%    Last Pain:  Vitals:   11/26/17 1130  TempSrc:   PainSc: 10-Worst pain ever                 Betania Dizon A.

## 2017-11-26 NOTE — Anesthesia Procedure Notes (Signed)
Anesthesia Regional Block: Pectoralis block   Pre-Anesthetic Checklist: ,, timeout performed, Correct Patient, Correct Site, Correct Laterality, Correct Procedure, Correct Position, site marked, Risks and benefits discussed,  Surgical consent,  Pre-op evaluation,  At surgeon's request and post-op pain management  Laterality: Right  Prep: chloraprep       Needles:  Injection technique: Single-shot  Needle Type: Echogenic Needle     Needle Length: 9cm  Needle Gauge: 21     Additional Needles:   Procedures:,,,, ultrasound used (permanent image in chart),,,,  Narrative:  Start time: 11/26/2017 7:11 AM End time: 11/26/2017 7:16 AM Injection made incrementally with aspirations every 5 mL.  Performed by: Personally  Anesthesiologist: Catalina Gravel, MD  Additional Notes: No pain on injection. No increased resistance to injection. Injection made in 5cc increments.  Good needle visualization.  Patient tolerated procedure well.

## 2017-11-26 NOTE — Anesthesia Preprocedure Evaluation (Addendum)
Anesthesia Evaluation  Patient identified by MRN, date of birth, ID band Patient awake    Reviewed: Allergy & Precautions, NPO status , Patient's Chart, lab work & pertinent test results, reviewed documented beta blocker date and time   History of Anesthesia Complications (+) PONV and history of anesthetic complications  Airway Mallampati: II  TM Distance: >3 FB Neck ROM: Full    Dental  (+) Teeth Intact, Dental Advisory Given   Pulmonary sleep apnea and Continuous Positive Airway Pressure Ventilation , former smoker,    Pulmonary exam normal breath sounds clear to auscultation       Cardiovascular hypertension, Pt. on home beta blockers and Pt. on medications  Rhythm:Regular Rate:Bradycardia     Neuro/Psych PSYCHIATRIC DISORDERS Anxiety Depression negative neurological ROS     GI/Hepatic negative GI ROS, Neg liver ROS,   Endo/Other  diabetes, Type 2, Oral Hypoglycemic AgentsHypothyroidism Morbid obesity  Renal/GU negative Renal ROS     Musculoskeletal  (+) Arthritis ,   Abdominal   Peds  Hematology negative hematology ROS (+)   Anesthesia Other Findings Day of surgery medications reviewed with the patient.  Right breast cancer  Reproductive/Obstetrics                            Anesthesia Physical Anesthesia Plan  ASA: III  Anesthesia Plan: General   Post-op Pain Management:  Regional for Post-op pain   Induction: Intravenous  PONV Risk Score and Plan: 4 or greater and Midazolam, Diphenhydramine, Dexamethasone, Ondansetron and Propofol infusion  Airway Management Planned: LMA  Additional Equipment:   Intra-op Plan:   Post-operative Plan: Extubation in OR  Informed Consent: I have reviewed the patients History and Physical, chart, labs and discussed the procedure including the risks, benefits and alternatives for the proposed anesthesia with the patient or authorized  representative who has indicated his/her understanding and acceptance.   Dental advisory given  Plan Discussed with: CRNA  Anesthesia Plan Comments:         Anesthesia Quick Evaluation

## 2017-11-26 NOTE — Discharge Instructions (Signed)

## 2017-11-26 NOTE — H&P (Signed)
[name]  Location: [facility] Patient #: [id number] DOB: 14-Jan-1951 Undefined / Language: Cleophus Molt / Race: White Female   History of Present Illness  The patient is a 67 year old female who presents with breast cancer. We are asked to see the patient in consultation by Dr. Sondra Come to evaluate her for a new right breast cancer. She was found to have 2 masses in right and one in left. left was benign. right UOQ was benign. The right breast mass in the LIQ measured 21m and was IDC, grade 3, and triple neg with a Ki67 of 60%. She does not smoke. She does have a husband with disability that is total care that will need placement to allow her to have surgery.   Past Surgical History Appendectomy  Breast Biopsy  Right. Knee Surgery  Bilateral.  Diagnostic Studies History  Colonoscopy  1-5 years ago Mammogram  within last year Pap Smear  1-5 years ago  Allergies  No Known Drug Allergies   Medication History ALPRAZolam (0.5MG Tablet, one Oral three times daily, as needed) Active. Benazepril HCl (40MG Tablet, Oral daily) Active. Colchicine (0.6MG Tablet, one Oral two times daily, as needed) Active. Aspirin (81MG Tablet, Oral daily) Active. Calcium Carbonate-Vitamin D (500-400MG-UNIT Tablet, Oral daily) Active. Gabapentin (400MG Capsule, one Oral daily) Active. (at bedtime) Glimepiride (4MG Tablet, Oral daily) Active. (with breakfast) HydroCHLOROthiazide (25MG Tablet, Oral daily) Active. Levothyroxine Sodium (150MCG Tablet, Oral daily) Active. (before breakfast) Glucophage (1000MG Tablet, Oral) Active. (1.5 tabs in the morning and 1 tablet every evening) Metoprolol Tartrate (50MG Tablet, one half tablet Oral two times daily) Active. Pioglitazone HCl (30MG Tablet, Oral daily) Active. Rosuvastatin Calcium (10MG Tablet, Oral daily) Active. Sertraline HCl (100MG Tablet, Oral daily) Active. Pantoprazole Sodium (40MG Tablet DR, Oral daily, as needed)  Active. Medications Reconciled  Social History  Alcohol use  Occasional alcohol use. Caffeine use  Coffee, Tea. No drug use  Tobacco use  Former smoker.  Family History  Alcohol Abuse  Father, Mother. Cancer  Brother. Diabetes Mellitus  Brother, Mother, Sister. Heart Disease  Brother, Father. Ischemic Bowel Disease  Brother. Respiratory Condition  Mother.  Pregnancy / Birth History  Age at menarche  89years. Age of menopause  [age]Gravida  1 Irregular periods  Maternal age  68-20Para  1  Other Problems Arthritis  Depression  Diabetes Mellitus  Hypercholesterolemia  Thyroid Disease     Review of Systems General Not Present- Appetite Loss, Chills, Fatigue, Fever, Night Sweats, Weight Gain and Weight Loss. Skin Not Present- Change in Wart/Mole, Dryness, Hives, Jaundice, New Lesions, Non-Healing Wounds, Rash and Ulcer. HEENT Not Present- Earache, Hearing Loss, Hoarseness, Nose Bleed, Oral Ulcers, Ringing in the Ears, Seasonal Allergies, Sinus Pain, Sore Throat, Visual Disturbances, Wears glasses/contact lenses and Yellow Eyes. Respiratory Not Present- Bloody sputum, Chronic Cough, Difficulty Breathing, Snoring and Wheezing. Breast Not Present- Breast Mass, Breast Pain, Nipple Discharge and Skin Changes. Cardiovascular Not Present- Chest Pain, Difficulty Breathing Lying Down, Leg Cramps, Palpitations, Rapid Heart Rate, Shortness of Breath and Swelling of Extremities. Gastrointestinal Not Present- Abdominal Pain, Bloating, Bloody Stool, Change in Bowel Habits, Chronic diarrhea, Constipation, Difficulty Swallowing, Excessive gas, Gets full quickly at meals, Hemorrhoids, Indigestion, Nausea, Rectal Pain and Vomiting. Female Genitourinary Not Present- Frequency, Nocturia, Painful Urination, Pelvic Pain and Urgency. Musculoskeletal Present- Joint Pain. Not Present- Back Pain, Joint Stiffness, Muscle Pain, Muscle Weakness and Swelling of  Extremities. Psychiatric Present- Change in Sleep Pattern. Not Present- Anxiety, Bipolar, Depression, Fearful and  Frequent crying. Endocrine Not Present- Cold Intolerance, Excessive Hunger, Hair Changes, Heat Intolerance, Hot flashes and New Diabetes. Hematology Not Present- Blood Thinners, Easy Bruising, Excessive bleeding, Gland problems, HIV and Persistent Infections.   Physical Exam  General Mental Status-Alert. General Appearance-Consistent with stated age. Hydration-Well hydrated. Voice-Normal.  Head and Neck Head-normocephalic, atraumatic with no lesions or palpable masses. Trachea-midline. Thyroid Gland Characteristics - normal size and consistency.  Eye Eyeball - Bilateral-Extraocular movements intact. Sclera/Conjunctiva - Bilateral-No scleral icterus.  Chest and Lung Exam Chest and lung exam reveals -quiet, even and easy respiratory effort with no use of accessory muscles and on auscultation, normal breath sounds, no adventitious sounds and normal vocal resonance. Inspection Chest Wall - Normal. Back - normal.  Breast Note: There is no palpable mass in either breast. there is no palpable axillary, supraclavicular, or cervical lymphadenopathy   Cardiovascular Cardiovascular examination reveals -normal heart sounds, regular rate and rhythm with no murmurs and normal pedal pulses bilaterally.  Abdomen Inspection Inspection of the abdomen reveals - No Hernias. Skin - Scar - no surgical scars. Palpation/Percussion Palpation and Percussion of the abdomen reveal - Soft, Non Tender, No Rebound tenderness, No Rigidity (guarding) and No hepatosplenomegaly. Auscultation Auscultation of the abdomen reveals - Bowel sounds normal.  Neurologic Neurologic evaluation reveals -alert and oriented x 3 with no impairment of recent or remote memory. Mental Status-Normal.  Musculoskeletal Normal Exam - Left-Upper Extremity Strength Normal and Lower  Extremity Strength Normal. Normal Exam - Right-Upper Extremity Strength Normal and Lower Extremity Strength Normal.  Lymphatic Head & Neck  General Head & Neck Lymphatics: Bilateral - Description - Normal. Axillary  General Axillary Region: Bilateral - Description - Normal. Tenderness - Non Tender. Femoral & Inguinal  Generalized Femoral & Inguinal Lymphatics: Bilateral - Description - Normal. Tenderness - Non Tender.    Assessment & Plan MALIGNANT NEOPLASM OF LOWER-INNER QUADRANT OF RIGHT BREAST OF FEMALE, ESTROGEN RECEPTOR NEGATIVE (C50.311) Impression: The patient appears to have a small stage I cancer in the LIQ of the right breast but she is a triple neg. I have discussed with her in detail the options for treatment and at this point she favors breast conservation and sentinel node mapping. She will need a port for adjuvant chemo. I have discussed with her the risks and benefits of the surgery as well as some of the technical aspects and she understands and wishes to proceed.

## 2017-11-26 NOTE — Interval H&P Note (Signed)
History and Physical Interval Note:  11/26/2017 8:03 AM  [name]  has presented today for surgery, with the diagnosis of RIGHT BREAST CANCER  The various methods of treatment have been discussed with the patient and family. After consideration of risks, benefits and other options for treatment, the patient has consented to  Procedure(s): BREAST LUMPECTOMY WITH RADIOACTIVE SEED AND SENTINEL LYMPH NODE BIOPSY (Right) INSERTION PORT-A-CATH (N/A) as a surgical intervention .  The patient's history has been reviewed, patient examined, no change in status, stable for surgery.  I have reviewed the patient's chart and labs.  Questions were answered to the patient's satisfaction.     [name]

## 2017-11-27 ENCOUNTER — Encounter (HOSPITAL_BASED_OUTPATIENT_CLINIC_OR_DEPARTMENT_OTHER): Payer: Self-pay | Admitting: General Surgery

## 2017-11-28 ENCOUNTER — Ambulatory Visit: Payer: Medicare HMO | Admitting: Internal Medicine

## 2017-11-28 ENCOUNTER — Telehealth: Payer: Self-pay

## 2017-11-28 NOTE — Telephone Encounter (Signed)
Spoke w/ [name] and [name], [name]'s sister- informed of recommendations. They will let us know if BP's continue to be low. [name] and her sister are on the way home to [facility].

## 2017-11-28 NOTE — Telephone Encounter (Signed)
If BPs are consistently below 110/70, okay to decrease benazepril to half tablet daily. If BPs continue to be low, she is to let me know. If she was told she had a lot of bleeding during the surgery, bring her to the office and check a CBC and BMP or arrange a OV w/me .

## 2017-11-28 NOTE — Telephone Encounter (Signed)
Copied from [place] [id number]. Topic: General - Other >> Nov 28, 2017 11:19 AM Carolyn Stare wrote: [name] will call on Monday with bp reading   Pt is having some low bp reading she had breast cancer surgery on 11/26/17 and they advise her to contact her pcp and let him know about these readings. Pt will be leaving for [place] to stay with her sister [name] while she go  through chemo.  Phone numbers she can be reached at [phone]

## 2017-11-29 DIAGNOSIS — W19XXXA Unspecified fall, initial encounter: Secondary | ICD-10-CM | POA: Diagnosis not present

## 2017-11-29 DIAGNOSIS — Z853 Personal history of malignant neoplasm of breast: Secondary | ICD-10-CM | POA: Diagnosis not present

## 2017-11-29 DIAGNOSIS — R401 Stupor: Secondary | ICD-10-CM | POA: Diagnosis not present

## 2017-11-29 DIAGNOSIS — I10 Essential (primary) hypertension: Secondary | ICD-10-CM | POA: Diagnosis not present

## 2017-11-29 DIAGNOSIS — E10649 Type 1 diabetes mellitus with hypoglycemia without coma: Secondary | ICD-10-CM | POA: Diagnosis not present

## 2017-11-29 DIAGNOSIS — E039 Hypothyroidism, unspecified: Secondary | ICD-10-CM | POA: Diagnosis not present

## 2017-11-29 DIAGNOSIS — S060X0A Concussion without loss of consciousness, initial encounter: Secondary | ICD-10-CM | POA: Diagnosis not present

## 2017-11-30 DIAGNOSIS — I10 Essential (primary) hypertension: Secondary | ICD-10-CM | POA: Diagnosis not present

## 2017-11-30 DIAGNOSIS — E039 Hypothyroidism, unspecified: Secondary | ICD-10-CM | POA: Diagnosis not present

## 2017-11-30 DIAGNOSIS — R401 Stupor: Secondary | ICD-10-CM | POA: Diagnosis not present

## 2017-11-30 DIAGNOSIS — W19XXXA Unspecified fall, initial encounter: Secondary | ICD-10-CM | POA: Diagnosis not present

## 2017-11-30 DIAGNOSIS — E10649 Type 1 diabetes mellitus with hypoglycemia without coma: Secondary | ICD-10-CM | POA: Diagnosis not present

## 2017-11-30 DIAGNOSIS — Z853 Personal history of malignant neoplasm of breast: Secondary | ICD-10-CM | POA: Diagnosis not present

## 2017-11-30 DIAGNOSIS — S060X0A Concussion without loss of consciousness, initial encounter: Secondary | ICD-10-CM | POA: Diagnosis not present

## 2017-12-10 ENCOUNTER — Encounter: Payer: Self-pay | Admitting: Adult Health

## 2017-12-10 ENCOUNTER — Telehealth: Payer: Self-pay | Admitting: Adult Health

## 2017-12-10 ENCOUNTER — Inpatient Hospital Stay: Payer: Medicare HMO | Attending: Oncology | Admitting: Adult Health

## 2017-12-10 VITALS — BP 88/68 | HR 36 | Temp 97.6°F | Resp 18 | Ht 65.0 in | Wt 262.9 lb

## 2017-12-10 DIAGNOSIS — M1990 Unspecified osteoarthritis, unspecified site: Secondary | ICD-10-CM | POA: Insufficient documentation

## 2017-12-10 DIAGNOSIS — Z87891 Personal history of nicotine dependence: Secondary | ICD-10-CM | POA: Diagnosis not present

## 2017-12-10 DIAGNOSIS — Z7984 Long term (current) use of oral hypoglycemic drugs: Secondary | ICD-10-CM | POA: Diagnosis not present

## 2017-12-10 DIAGNOSIS — Z79899 Other long term (current) drug therapy: Secondary | ICD-10-CM | POA: Diagnosis not present

## 2017-12-10 DIAGNOSIS — Z9989 Dependence on other enabling machines and devices: Secondary | ICD-10-CM

## 2017-12-10 DIAGNOSIS — G4730 Sleep apnea, unspecified: Secondary | ICD-10-CM | POA: Diagnosis not present

## 2017-12-10 DIAGNOSIS — Z7982 Long term (current) use of aspirin: Secondary | ICD-10-CM | POA: Diagnosis not present

## 2017-12-10 DIAGNOSIS — E039 Hypothyroidism, unspecified: Secondary | ICD-10-CM | POA: Insufficient documentation

## 2017-12-10 DIAGNOSIS — I959 Hypotension, unspecified: Secondary | ICD-10-CM | POA: Insufficient documentation

## 2017-12-10 DIAGNOSIS — C50311 Malignant neoplasm of lower-inner quadrant of right female breast: Secondary | ICD-10-CM | POA: Insufficient documentation

## 2017-12-10 DIAGNOSIS — Z171 Estrogen receptor negative status [ER-]: Secondary | ICD-10-CM | POA: Insufficient documentation

## 2017-12-10 DIAGNOSIS — Z780 Asymptomatic menopausal state: Secondary | ICD-10-CM | POA: Diagnosis not present

## 2017-12-10 DIAGNOSIS — E119 Type 2 diabetes mellitus without complications: Secondary | ICD-10-CM | POA: Diagnosis not present

## 2017-12-10 DIAGNOSIS — I10 Essential (primary) hypertension: Secondary | ICD-10-CM | POA: Insufficient documentation

## 2017-12-10 DIAGNOSIS — Z800 Family history of malignant neoplasm of digestive organs: Secondary | ICD-10-CM

## 2017-12-10 NOTE — Telephone Encounter (Signed)
Gave pt avs and calendar  °

## 2017-12-10 NOTE — Progress Notes (Addendum)
[place]  Telephone:(336) [phone] Fax:([phone]) [phone]     ID: [name] DOB: December 27, 1950  MR#: [id number]  PYP#:[id number]  Patient Care Team: Colon Branch, MD as PCP - Catherine Paradise, MD as Consulting Physician (Physical Medicine and Rehabilitation) Catherine Gell, MD as Consulting Physician (Obstetrics and Gynecology) Catherine Kussmaul, MD as Consulting Physician (General Surgery) [name], Catherine Dad, MD as Consulting Physician (Oncology) Gery Pray, MD as Consulting Physician (Radiation Oncology) Catherine Cancel, MD as Consulting Physician (Orthopedic Surgery) [name] as Consulting Physician (Pulmonary Disease) OTHER MD:  CHIEF COMPLAINT: Triple negative breast cancer  CURRENT TREATMENT:  adjuvant chemotherapy pending   HISTORY OF CURRENT ILLNESS: From the original intake note:  [name] was playing with her 100 pound blood hand and the dog master left breast.  She brought the bruising and swelling up to her primary care physician.  The patient underwent bilateral diagnostic mammography with tomography and bilateral breast ultrasonography at [facility] on 10/12/2017 showing: breast density category B. There was a suspicious mass within the lower inner quadrant of the right breast at 3:30 measuring 0.7 x 0.5 x 0.6 cm.  There was a second mass in the right breast located at approximately 12:00, measuring 1 cm.  Sonographically, there were no abnormal right axillary lymph nodes.The area of concern in the left breast was mammographically benign.     On 10/16/2017 [name] proceeded to biopsy of the 330 o'clock right breast area in question. The pathology from this procedure showed ([id number]): Invasive ductal carcinoma, grade 3. Prognostic indicators significant for: both estrogen receptor and progesterone receptor , 0% negative. Proliferation marker Ki67 at 60%. HER2 negative by immunohistochemistry (1+) .  On 10/23/2017 the second area of  concern in the right breast, at 12:00, was biopsied and showed no evidence of malignancy.  At the same time the area and the left breast at 1200 was also biopsied and showed only fat necrosis ([id number]).  These results were concordant.  The patient's subsequent history is as detailed below.  INTERVAL HISTORY: [name] is here today for follow up of her triple negative breast cancer.  She is accompanied by her sister [name].   Since her last visit she underwent a right breast lumpectomy on 11/26/2017 that demonstrated a 76m triple negative grade 3 invasive ductal carcinoma, margins were negative, and 3 sentinel lymph nodes were negative.  A port was also placed at that time.  [name] doing well today since surgery.  She notes some swelling and pressure in her right axilla that occurred several days ago and became worse while cutting steak.  She takes aleve for pain which is helping.  She notes that she took one Norco tablet and subsequently had a low blood sugar in the 20s.  She hasn't taken any narcotics since.  She has f/u with Dr. TMarlou Starks her surgeon tomorrow, 12/11/2017.   REVIEW OF SYSTEMS: [name] doing well today.  She has a decreased heart rate in the thirties and is slightly hypotensive.  She is on a few cardiac medications [place].  She is asymptomatic and denies any chest pain, palpitations, shortness of breath or dizziness.    [name] has diabetes that is well controlled.  Her last A1c was 6.2 that was checked 6 months ago with her PCP.  She is taking diabetes medications and is tolerating these well.  She is compliant with her medications.  PTreanareviewed with [place]  today that she needs to receive her chemotherapy and radiation while in [place], [place] where her sister [name] lives.  [name] has a husband who is on disability, and her only child passed away 5 years ago.  They have found a cancer center near [place] that they would like to use.  Other than the  findings noted above, [name] is doing well.  She denies any unusual headaches, vision changes, bowel/bladder changes, weakness, fevers, chills, nausea/vomiting, or any other concerns.  A detailed ROS was non contributory.     PAST MEDICAL HISTORY: Past Medical History:  Diagnosis Date  . Abnormal nerve conduction studies 08/2015   moderate R carpal tunnel syndrome   . Bradycardia   . Breast cancer ([place]) 09/2017  . Complication of anesthesia    HR dropped to 20  . Depression   . Diabetes mellitus    has diarrhea from medicine  . Dyslipidemia   . Goiter    h/o  . Hypertension   . Hypothyroidism    w/ h/o goiter  . Morbid obesity ([place])   . Osteoarthritis   . PONV (postoperative nausea and vomiting)   . Seasonal allergies   . Sleep apnea    on CPAP nightly    PAST SURGICAL HISTORY: Past Surgical History:  Procedure Laterality Date  . APPENDECTOMY  1970's  . BREAST LUMPECTOMY WITH RADIOACTIVE SEED AND SENTINEL LYMPH NODE BIOPSY Right 11/26/2017   Procedure: BREAST LUMPECTOMY WITH RADIOACTIVE SEED AND SENTINEL LYMPH NODE BIOPSY;  Surgeon: Catherine Kussmaul, MD;  Location: [place];  Service: General;  Laterality: Right;  . CARPAL TUNNEL RELEASE Right 01/2016  . COLONOSCOPY    . KNEE ARTHROSCOPY W/ MENISCECTOMY Right 2007  . PORTACATH PLACEMENT Left 11/26/2017   Procedure: INSERTION PORT-A-CATH;  Surgeon: Catherine Kussmaul, MD;  Location: [place];  Service: General;  Laterality: Left;  . TOTAL KNEE ARTHROPLASTY  08/05/07   right  . TOTAL KNEE ARTHROPLASTY Left 07/01/2013   Procedure: LEFT TOTAL KNEE ARTHROPLASTY;  Surgeon: Mauri Pole, MD;  Location: [facility];  Service: Orthopedics;  Laterality: Left;    FAMILY HISTORY Family History  Problem Relation Age of Onset  . Stomach cancer Brother   . Esophageal cancer Brother   . Coronary artery disease Father        F MI age 76, 70 at age 44  . Hypertension Father   . Diabetes Mother        M , brother  amd sister  . Lung cancer Mother        around the lungs  . CAD Brother        died age 12, h/o DM-Tobacco-PVD  . Colon cancer Neg Hx   . Breast cancer Neg Hx   . Rectal cancer Neg Hx   The patient's father died at age 47 "due to old age ". The patient's mother died at age 46 due to lung cancer (heavy smoker). The patient had 3 brothers 1 of whom died due to stomach cancer and the other 2 due to heart attacks and diabetes. The patient has 1 sister. There was a paternal grandmother with breast cancer. The patient's mother was adopted and the family has no details of her genetic history. The patient denies a history of ovarian cancer in the family.   GYNECOLOGIC HISTORY:  No LMP recorded. Patient is postmenopausal. Menarche: 67 years old Age at first live birth: 67 years old She is GX P1.  Her  LMP was in 2001.  She used contraceptives remotely without complication.  She did not use HRT.  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 The patient's husband, Coralee Pesa, is a [place] War veteran and worked as a Glass blower/designer for [employer]. He is disabled and wheelchair bound from Parkinson's and  PTSD from the [place] war. The patient's daughter, Freida Busman died at age 29 due to heart issues and complications from a car wreck . The patient has no grandchildren. She attends [employer].     ADVANCED DIRECTIVES: The patient's husband is currently her HCPOA, but the patient's intends to name her sister, [name], who lives in [place].  At the 10/24/2017 visit the patient was given the appropriate documents to complete and document at her discretion.   HEALTH MAINTENANCE: Social History   Tobacco Use  . Smoking status: Former Smoker    Packs/day: 0.50    Years: 10.00    Pack years: 5.00    Types: Cigarettes    Last attempt to quit: 02/28/1980    Years since quitting: 37.8  . Smokeless tobacco: Never Used  Substance Use Topics  . Alcohol use: Yes     Comment: occasionally  . Drug use: No     Colonoscopy: July 2014 / Dr. Carlean Purl diverticulosis  PAP:  Bone density: 09/26/2016 showed a T score of -1.4 osteopenia   No Known Allergies  Current Outpatient Medications  Medication Sig Dispense Refill  . ALPRAZolam (XANAX) 0.5 MG tablet Take 1 tablet (0.5 mg total) by mouth 3 (three) times daily as needed for anxiety. 90 tablet 1  . aspirin 81 MG tablet Take 81 mg by mouth daily.    . benazepril (LOTENSIN) 40 MG tablet Take 1 tablet (40 mg total) by mouth daily. 90 tablet 1  . Calcium Carbonate-Vitamin D (CALCIUM + D PO) Take 1 tablet by mouth daily.     . cetirizine (ZYRTEC) 10 MG tablet Take 10 mg by mouth daily.    . colchicine 0.6 MG tablet Take 1 tablet (0.6 mg total) by mouth 2 (two) times daily as needed. 1 tablet 0  . cyclobenzaprine (FLEXERIL) 10 MG tablet Take 1 tablet (10 mg total) by mouth at bedtime as needed for muscle spasms. 21 tablet 0  . gabapentin (NEURONTIN) 300 MG capsule Take 1 capsule (300 mg total) by mouth at bedtime. 90 capsule 1  . glimepiride (AMARYL) 4 MG tablet Take 1 tablet (4 mg total) by mouth daily with breakfast. 90 tablet 1  . glucose blood (ONE TOUCH ULTRA TEST) test strip Use as instructed 100 each 12  . hydrochlorothiazide (HYDRODIURIL) 25 MG tablet Take 1 tablet (25 mg total) by mouth daily. 90 tablet 1  . HYDROcodone-acetaminophen (NORCO/VICODIN) 5-325 MG tablet Take 1-2 tablets by mouth every 6 (six) hours as needed for moderate pain or severe pain. 15 tablet 0  . levothyroxine (SYNTHROID, LEVOTHROID) 150 MCG tablet Take 1 tablet (150 mcg total) by mouth daily before breakfast. 90 tablet 1  . metFORMIN (GLUCOPHAGE) 1000 MG tablet TAKE 1.5 TABLETS BY MOUTH EVERY MORNING AND 1 TABLET BY MOUTH EVERY EVENING. 225 tablet 1  . metoprolol tartrate (LOPRESSOR) 50 MG tablet Take 0.5 tablets (25 mg total) by mouth 2 (two) times daily. (Patient taking differently: Take 25 mg by mouth daily. ) 90 tablet 1  .  pioglitazone (ACTOS) 30 MG tablet Take 1 tablet (30 mg total) by mouth daily. 90 tablet 1  . rosuvastatin (CRESTOR)  10 MG tablet Take 1 tablet (10 mg total) by mouth daily. 90 tablet 1  . sertraline (ZOLOFT) 100 MG tablet Take 2 tablets (200 mg total) by mouth daily. 180 tablet 1   No current facility-administered medications for this visit.     OBJECTIVE:  Vitals:   12/10/17 1033 12/10/17 1044  BP: (!) 85/64 (!) 88/68  Pulse:  (!) 36  Resp:    Temp:    SpO2:       Body mass index is 43.75 kg/m.   Wt Readings from Last 3 Encounters:  12/10/17 262 lb 14.4 oz (119.3 kg)  11/26/17 257 lb 0.9 oz (116.6 kg)  10/24/17 259 lb 12.8 oz (117.8 kg)  ECOG FS:1 - Symptomatic but completely ambulatory GENERAL: Patient is a well appearing female in no acute distress HEENT:  Sclerae anicteric.  Oropharynx clear and moist. No ulcerations or evidence of oropharyngeal candidiasis. Neck is supple.  NODES:  No cervical, supraclavicular, or axillary lymphadenopathy palpated.  BREAST EXAM:  Right breast s/p lumpectomy, healing well, mild swelling around lumpectomy site, swelling noted around axillary incision, with likely fluid collection as well LUNGS:  Clear to auscultation bilaterally.  No wheezes or rhonchi. HEART:  Regular rhythm, bradycardia in 30s. No murmur appreciated. ABDOMEN:  Soft, nontender.  Positive, normoactive bowel sounds. No organomegaly palpated. MSK:  No focal spinal tenderness to palpation. Full range of motion bilaterally in the upper extremities. EXTREMITIES:  No peripheral edema.   SKIN:  Clear with no obvious rashes or skin changes. No nail dyscrasia. NEURO:  Nonfocal. Well oriented.  Appropriate affect.     LAB RESULTS:  CMP     Component Value Date/Time   NA 141 10/24/2017 1239   NA 143 02/27/2016   K 4.8 10/24/2017 1239   CL 111 10/24/2017 1239   CO2 23 10/24/2017 1239   GLUCOSE 94 10/24/2017 1239   BUN 36 (H) 10/24/2017 1239   BUN 29 (A) 02/27/2016    CREATININE 1.43 (H) 10/24/2017 1239   CALCIUM 9.3 10/24/2017 1239   PROT 7.0 10/24/2017 1239   ALBUMIN 3.5 10/24/2017 1239   AST 29 10/24/2017 1239   ALT 28 10/24/2017 1239   ALKPHOS 96 10/24/2017 1239   BILITOT 0.3 10/24/2017 1239   GFRNONAA 37 (L) 10/24/2017 1239   GFRAA 43 (L) 10/24/2017 1239    No results found for: TOTALPROTELP, ALBUMINELP, A1GS, A2GS, BETS, BETA2SER, GAMS, MSPIKE, SPEI  No results found for: KPAFRELGTCHN, LAMBDASER, KAPLAMBRATIO  Lab Results  Component Value Date   WBC 5.0 10/24/2017   NEUTROABS 2.8 10/24/2017   HGB 12.3 10/24/2017   HCT 38.2 10/24/2017   MCV 90.2 10/24/2017   PLT 276 10/24/2017    _0 @  No results found for: LABCA2  No components found for: DJMEQA834  No results for input(s): INR in the last 168 hours.  No results found for: LABCA2  No results found for: HDQ222  No results found for: LNL892  No results found for: JJH417  No results found for: CA2729  No components found for: HGQUANT  No results found for: CEA1 / No results found for: CEA1   No results found for: AFPTUMOR  No results found for: CHROMOGRNA  No results found for: PSA1  No visits with results within 3 Day(s) from this visit.  Latest known visit with results is:  Admission on 11/26/2017, Discharged on 11/26/2017  Component Date Value Ref Range Status  . Glucose-Capillary 11/26/2017 109* 70 - 99 mg/dL Final  . Glucose-Capillary  11/26/2017 131* 70 - 99 mg/dL Final    (this displays the last labs from the last 3 days)  No results found for: TOTALPROTELP, ALBUMINELP, A1GS, A2GS, BETS, BETA2SER, GAMS, MSPIKE, SPEI (this displays SPEP labs)  No results found for: KPAFRELGTCHN, LAMBDASER, KAPLAMBRATIO (kappa/lambda light chains)  No results found for: HGBA, HGBA2QUANT, HGBFQUANT, HGBSQUAN (Hemoglobinopathy evaluation)   No results found for: LDH  Lab Results  Component Value Date   IRON 52 07/31/2016   (Iron and TIBC)  Lab Results    Component Value Date   FERRITIN 53.2 07/31/2016    Urinalysis    Component Value Date/Time   COLORURINE YELLOW 06/23/2013 1440   APPEARANCEUR CLEAR 06/23/2013 1440   LABSPEC 1.019 06/23/2013 1440   PHURINE 6.0 06/23/2013 1440   GLUCOSEU 250 (A) 06/23/2013 1440   HGBUR NEGATIVE 06/23/2013 1440   HGBUR negative 04/24/2007 0000   BILIRUBINUR NEGATIVE 06/23/2013 1440   KETONESUR NEGATIVE 06/23/2013 1440   PROTEINUR NEGATIVE 06/23/2013 1440   UROBILINOGEN 0.2 06/23/2013 1440   NITRITE NEGATIVE 06/23/2013 1440   LEUKOCYTESUR SMALL (A) 06/23/2013 1440     STUDIES: Nm Sentinel Node Inj-no Rpt (breast)  Result Date: 11/26/2017 Sulfur colloid was injected by the nuclear medicine technologist for melanoma sentinel node.   Mm Breast Surgical Specimen  Result Date: 11/26/2017 CLINICAL DATA:  Status post lumpectomy today after earlier radioactive seed localization. EXAM: SPECIMEN RADIOGRAPH OF THE RIGHT BREAST COMPARISON:  Previous exam(s). FINDINGS: Status post excision of the right breast. The radioactive seed and biopsy marker clip are present, completely intact, and were marked for pathology. The locations of the radioactive seed and biopsy marker clip within the specimen were discussed with the OR staff during the procedure. IMPRESSION: Specimen radiograph of the right breast. Electronically Signed   By: Franki Cabot M.D.   On: 11/26/2017 10:17   Dg Chest Port 1 View  Result Date: 11/26/2017 CLINICAL DATA:  Porta catheter placement EXAM: PORTABLE CHEST 1 VIEW COMPARISON:  09/26/2016 FINDINGS: Subclavian porta catheter on the left with tip at the upper cavoatrial junction. No evidence of pneumothorax. No vessel kinking. No mediastinal widening or pleural fluid. Borderline heart size, accentuated by technique. Prominent main pulmonary artery contour. Mild coarsening of lung markings, likely atelectasis. IMPRESSION: Left-sided porta catheter without adverse feature. Electronically Signed    By: Monte Fantasia M.D.   On: 11/26/2017 11:55   Dg Fluoro Guide Cv Line-no Report  Result Date: 11/26/2017 Fluoroscopy was utilized by the requesting physician.  No radiographic interpretation.   Mm Rt Radioactive Seed Loc Mammo Guide  Result Date: 11/23/2017 CLINICAL DATA:  Patient presents for radioactive seed localization of biopsy-proven malignancy over the inner lower right breast marked by ribbon clip. EXAM: MAMMOGRAPHIC GUIDED RADIOACTIVE SEED LOCALIZATION OF THE RIGHT BREAST COMPARISON:  Previous exam(s). FINDINGS: Patient presents for radioactive seed localization prior to surgical excision. I met with the patient and we discussed the procedure of seed localization including benefits and alternatives. We discussed the high likelihood of a successful procedure. We discussed the risks of the procedure including infection, bleeding, tissue injury and further surgery. We discussed the low dose of radioactivity involved in the procedure. Informed, written consent was given. The usual time-out protocol was performed immediately prior to the procedure. Using mammographic guidance, sterile technique, 1% lidocaine and an I-125 radioactive seed, the ribbon clip was localized using a medial to lateral approach. The radioactive seed lies directly at the targeted ribbon shaped metallic clip. The follow-up mammogram images confirm the  seed in the expected location and were marked for Dr. Marlou Starks. Follow-up survey of the patient confirms presence of the radioactive seed. Order number of I-125 seed:  [id number]. Total activity:  4.098 millicurie reference Date: 11/20/2017 The patient tolerated the procedure well and was released from the [facility]. She was given instructions regarding seed removal. IMPRESSION: Radioactive seed localization right breast. No apparent complications. Electronically Signed   By: Marin Olp M.D.   On: 11/23/2017 13:45    ELIGIBLE FOR AVAILABLE RESEARCH PROTOCOL: no  ASSESSMENT:  67 y.o. [place], [place] woman status post right breast lower inner quadrant biopsy 10/16/2017 for a clinical T1b N0, stage IB invasive ductal carcinoma, grade 3, triple negative, with a proliferation marker of 60%  (1) status post right lumpectomy and sentinel lymph node sampling on 11/26/2017 for a pT1b pN0, stage IB invasive ductal carcinoma, with negative margins.  (a) a total of 3 sentinel lymph nodes were removed  (b) port was placed at the time of surgery  (2) adjuvant chemotherapy to follow: consider TC vs CMF vs CA/T  (3) adjuvant radiation to follow  PLAN: Luiza is doing well today.  She met with myself and Dr. Jana Hakim to review her pathology in detail which reveal a T1b,N0 stage IB triple negative breast cancer.  He reviewed her risk of recurrence with and without chemotherapy, and the fact that he does recommend adjuvant chemotherapy to decrease her risk of recurrence.  He reviewed two different chemotherapy regimens: the first one being Doxorubicin and Cyclophosphamide followed by Paclitaxel and the second one being Cyclophosphamide, Methotrexate and Fluorouracil (CMF).  He reviewed that both are good regimens, however the latter (CMF), is less intense, easier tolerated, but takes longer to receive.  Due to her co morbidities and age, he recommended CMF given once every 3 weeks for 8 treatments.    Desirae will need to receive her treatments in [place].  We reviewed that she will need to see an oncologist in [place] and will fax our recommendations, however they will make an independent decision and order and follow her during her chemotherapy treatments.  We have gathered up her records from here, and have given them to her in hand, and will also fax them to [phone].  We reviewed Tiyanna's bradycardia.  She was instructed to stop taking Metoprolol. She was also recommended to see a cardiologist in [place] for follow up, since her heart rate is so low today.  We reviewed advanced  directives and Idona was recommended to consider making decisions regarding a HCPOA and living will.  She was given information about this today in detail.    Dodie has a good understanding of the overall plan. She agrees with it. She knows the goal of treatment in her case is cure. She will call with any problems that may develop before her next visit here.   Wilber Bihari, NP  12/10/17 10:55 AM Medical Oncology and Hematology [facility] [address] [place], [place] [address] Tel. [phone]    Fax. [phone]   ADDENDUM: Glenys did well with her surgery.  She ended up having a small node-negative tumor which is however triple negative.  We discussed that she probably would do well if she did not receive any systemic therapy.  I quoted her a risk of distant recurrence in the 30% range.  She can bring this down significantly by taking chemotherapy.  Because of her diabetes I do not think she would be a good candidate for  Taxotere so the Taxotere Cytoxan combination would not be a good idea for her.  We could consider Cytoxan/doxorubicin followed by paclitaxel.  If we did not use dexamethasone for nausea and if we exchanged the paclitaxel for Abraxane, we might be able to get her through these treatments without using steroids.  However given her other comorbidities and her relatively good prognosis I think Tsai claw phosphide/methotrexate/fluorouracil could also be considered.  This is not the standard of care but it is a very acceptable alternative, it would be considerably easier on her, and she certainly would not need steroids for these treatments.  If she is stayed here I would do a CMF chemotherapy for 8 cycles with radiation given with cycles 4 5 and 6.  However she is moving to [place] for family reasons.  She will discuss this option with all the other options with her oncologist there and they will make the final decision at that time.  I have set her up to  return to see [place] in July.  She knows to call for any other issues that may develop before that visit.   I personally saw this patient and performed a substantive portion of this encounter with the listed APP documented above.   Chauncey Cruel, MD Medical Oncology and Hematology [facility] [address] [place], [place] [address] Tel. [phone]    Fax. [phone]

## 2017-12-11 ENCOUNTER — Other Ambulatory Visit: Payer: Self-pay | Admitting: Oncology

## 2017-12-12 ENCOUNTER — Other Ambulatory Visit: Payer: Self-pay | Admitting: Adult Health

## 2017-12-12 DIAGNOSIS — R001 Bradycardia, unspecified: Secondary | ICD-10-CM

## 2017-12-18 DIAGNOSIS — Z79899 Other long term (current) drug therapy: Secondary | ICD-10-CM | POA: Diagnosis not present

## 2017-12-18 DIAGNOSIS — Z171 Estrogen receptor negative status [ER-]: Secondary | ICD-10-CM | POA: Diagnosis not present

## 2017-12-18 DIAGNOSIS — E039 Hypothyroidism, unspecified: Secondary | ICD-10-CM | POA: Diagnosis not present

## 2017-12-18 DIAGNOSIS — E049 Nontoxic goiter, unspecified: Secondary | ICD-10-CM | POA: Diagnosis not present

## 2017-12-18 DIAGNOSIS — R001 Bradycardia, unspecified: Secondary | ICD-10-CM | POA: Diagnosis not present

## 2017-12-18 DIAGNOSIS — Z7984 Long term (current) use of oral hypoglycemic drugs: Secondary | ICD-10-CM | POA: Diagnosis not present

## 2017-12-18 DIAGNOSIS — M1990 Unspecified osteoarthritis, unspecified site: Secondary | ICD-10-CM | POA: Diagnosis not present

## 2017-12-18 DIAGNOSIS — C50911 Malignant neoplasm of unspecified site of right female breast: Secondary | ICD-10-CM | POA: Diagnosis not present

## 2017-12-18 DIAGNOSIS — E785 Hyperlipidemia, unspecified: Secondary | ICD-10-CM | POA: Diagnosis not present

## 2017-12-18 DIAGNOSIS — E119 Type 2 diabetes mellitus without complications: Secondary | ICD-10-CM | POA: Diagnosis not present

## 2017-12-18 DIAGNOSIS — Z6841 Body Mass Index (BMI) 40.0 and over, adult: Secondary | ICD-10-CM | POA: Diagnosis not present

## 2017-12-18 DIAGNOSIS — Z8249 Family history of ischemic heart disease and other diseases of the circulatory system: Secondary | ICD-10-CM | POA: Diagnosis not present

## 2017-12-18 DIAGNOSIS — C50919 Malignant neoplasm of unspecified site of unspecified female breast: Secondary | ICD-10-CM | POA: Diagnosis not present

## 2017-12-20 ENCOUNTER — Ambulatory Visit: Payer: Medicare HMO | Admitting: Physical Therapy

## 2017-12-20 DIAGNOSIS — Z5111 Encounter for antineoplastic chemotherapy: Secondary | ICD-10-CM | POA: Diagnosis not present

## 2017-12-20 DIAGNOSIS — I361 Nonrheumatic tricuspid (valve) insufficiency: Secondary | ICD-10-CM | POA: Diagnosis not present

## 2017-12-20 DIAGNOSIS — R931 Abnormal findings on diagnostic imaging of heart and coronary circulation: Secondary | ICD-10-CM | POA: Diagnosis not present

## 2017-12-20 DIAGNOSIS — R69 Illness, unspecified: Secondary | ICD-10-CM | POA: Diagnosis not present

## 2017-12-20 DIAGNOSIS — I340 Nonrheumatic mitral (valve) insufficiency: Secondary | ICD-10-CM | POA: Diagnosis not present

## 2017-12-20 DIAGNOSIS — I358 Other nonrheumatic aortic valve disorders: Secondary | ICD-10-CM | POA: Diagnosis not present

## 2017-12-21 DIAGNOSIS — I129 Hypertensive chronic kidney disease with stage 1 through stage 4 chronic kidney disease, or unspecified chronic kidney disease: Secondary | ICD-10-CM | POA: Diagnosis not present

## 2017-12-21 DIAGNOSIS — C50919 Malignant neoplasm of unspecified site of unspecified female breast: Secondary | ICD-10-CM | POA: Diagnosis not present

## 2017-12-21 DIAGNOSIS — E1169 Type 2 diabetes mellitus with other specified complication: Secondary | ICD-10-CM | POA: Diagnosis not present

## 2017-12-21 DIAGNOSIS — E119 Type 2 diabetes mellitus without complications: Secondary | ICD-10-CM | POA: Diagnosis not present

## 2017-12-21 DIAGNOSIS — R9431 Abnormal electrocardiogram [ECG] [EKG]: Secondary | ICD-10-CM | POA: Diagnosis not present

## 2017-12-21 DIAGNOSIS — R001 Bradycardia, unspecified: Secondary | ICD-10-CM | POA: Diagnosis not present

## 2017-12-21 DIAGNOSIS — G4733 Obstructive sleep apnea (adult) (pediatric): Secondary | ICD-10-CM | POA: Diagnosis not present

## 2017-12-21 DIAGNOSIS — N183 Chronic kidney disease, stage 3 (moderate): Secondary | ICD-10-CM | POA: Diagnosis not present

## 2017-12-21 DIAGNOSIS — I358 Other nonrheumatic aortic valve disorders: Secondary | ICD-10-CM | POA: Diagnosis not present

## 2017-12-21 DIAGNOSIS — E1122 Type 2 diabetes mellitus with diabetic chronic kidney disease: Secondary | ICD-10-CM | POA: Diagnosis not present

## 2017-12-24 DIAGNOSIS — Z01810 Encounter for preprocedural cardiovascular examination: Secondary | ICD-10-CM | POA: Diagnosis not present

## 2017-12-24 DIAGNOSIS — I10 Essential (primary) hypertension: Secondary | ICD-10-CM | POA: Diagnosis not present

## 2017-12-24 DIAGNOSIS — I6523 Occlusion and stenosis of bilateral carotid arteries: Secondary | ICD-10-CM | POA: Diagnosis not present

## 2017-12-24 DIAGNOSIS — I471 Supraventricular tachycardia: Secondary | ICD-10-CM | POA: Diagnosis not present

## 2017-12-24 DIAGNOSIS — Z4803 Encounter for change or removal of drains: Secondary | ICD-10-CM | POA: Diagnosis not present

## 2017-12-24 DIAGNOSIS — C50311 Malignant neoplasm of lower-inner quadrant of right female breast: Secondary | ICD-10-CM | POA: Diagnosis not present

## 2017-12-24 DIAGNOSIS — Z9889 Other specified postprocedural states: Secondary | ICD-10-CM | POA: Diagnosis not present

## 2017-12-25 ENCOUNTER — Ambulatory Visit: Payer: Medicare HMO | Admitting: Internal Medicine

## 2017-12-25 DIAGNOSIS — C50919 Malignant neoplasm of unspecified site of unspecified female breast: Secondary | ICD-10-CM | POA: Diagnosis not present

## 2017-12-25 DIAGNOSIS — E1122 Type 2 diabetes mellitus with diabetic chronic kidney disease: Secondary | ICD-10-CM | POA: Diagnosis not present

## 2017-12-25 DIAGNOSIS — C50911 Malignant neoplasm of unspecified site of right female breast: Secondary | ICD-10-CM | POA: Diagnosis not present

## 2017-12-25 DIAGNOSIS — N183 Chronic kidney disease, stage 3 (moderate): Secondary | ICD-10-CM | POA: Diagnosis not present

## 2017-12-25 DIAGNOSIS — Z171 Estrogen receptor negative status [ER-]: Secondary | ICD-10-CM | POA: Diagnosis not present

## 2017-12-25 DIAGNOSIS — E669 Obesity, unspecified: Secondary | ICD-10-CM | POA: Diagnosis not present

## 2017-12-25 DIAGNOSIS — E049 Nontoxic goiter, unspecified: Secondary | ICD-10-CM | POA: Diagnosis not present

## 2017-12-25 DIAGNOSIS — Z9889 Other specified postprocedural states: Secondary | ICD-10-CM | POA: Diagnosis not present

## 2017-12-25 DIAGNOSIS — N6489 Other specified disorders of breast: Secondary | ICD-10-CM | POA: Diagnosis not present

## 2017-12-25 DIAGNOSIS — N289 Disorder of kidney and ureter, unspecified: Secondary | ICD-10-CM | POA: Diagnosis not present

## 2017-12-25 DIAGNOSIS — E039 Hypothyroidism, unspecified: Secondary | ICD-10-CM | POA: Diagnosis not present

## 2017-12-26 DIAGNOSIS — Z9889 Other specified postprocedural states: Secondary | ICD-10-CM | POA: Diagnosis not present

## 2017-12-26 DIAGNOSIS — Z01810 Encounter for preprocedural cardiovascular examination: Secondary | ICD-10-CM | POA: Diagnosis not present

## 2017-12-26 DIAGNOSIS — I10 Essential (primary) hypertension: Secondary | ICD-10-CM | POA: Diagnosis not present

## 2017-12-26 DIAGNOSIS — Z4803 Encounter for change or removal of drains: Secondary | ICD-10-CM | POA: Diagnosis not present

## 2017-12-26 DIAGNOSIS — C50311 Malignant neoplasm of lower-inner quadrant of right female breast: Secondary | ICD-10-CM | POA: Diagnosis not present

## 2017-12-31 ENCOUNTER — Telehealth: Payer: Self-pay | Admitting: *Deleted

## 2017-12-31 DIAGNOSIS — C50311 Malignant neoplasm of lower-inner quadrant of right female breast: Secondary | ICD-10-CM | POA: Diagnosis not present

## 2017-12-31 DIAGNOSIS — I272 Pulmonary hypertension, unspecified: Secondary | ICD-10-CM | POA: Diagnosis not present

## 2017-12-31 DIAGNOSIS — Z5111 Encounter for antineoplastic chemotherapy: Secondary | ICD-10-CM | POA: Diagnosis not present

## 2017-12-31 DIAGNOSIS — Z5112 Encounter for antineoplastic immunotherapy: Secondary | ICD-10-CM | POA: Diagnosis not present

## 2017-12-31 NOTE — Telephone Encounter (Signed)
Forwarded request for Dobutamine [Nuclear] Stress Test done in 2015 via fax to [facility] 11/04

## 2018-01-01 DIAGNOSIS — E034 Atrophy of thyroid (acquired): Secondary | ICD-10-CM | POA: Diagnosis not present

## 2018-01-01 DIAGNOSIS — E040 Nontoxic diffuse goiter: Secondary | ICD-10-CM | POA: Diagnosis not present

## 2018-01-07 ENCOUNTER — Encounter: Payer: Self-pay | Admitting: Internal Medicine

## 2018-01-21 DIAGNOSIS — Z5111 Encounter for antineoplastic chemotherapy: Secondary | ICD-10-CM | POA: Diagnosis not present

## 2018-01-21 DIAGNOSIS — Z5112 Encounter for antineoplastic immunotherapy: Secondary | ICD-10-CM | POA: Diagnosis not present

## 2018-01-21 DIAGNOSIS — C50919 Malignant neoplasm of unspecified site of unspecified female breast: Secondary | ICD-10-CM | POA: Diagnosis not present

## 2018-01-31 DIAGNOSIS — R001 Bradycardia, unspecified: Secondary | ICD-10-CM | POA: Diagnosis not present

## 2018-01-31 DIAGNOSIS — I471 Supraventricular tachycardia: Secondary | ICD-10-CM | POA: Diagnosis not present

## 2018-01-31 DIAGNOSIS — R000 Tachycardia, unspecified: Secondary | ICD-10-CM | POA: Diagnosis not present

## 2018-01-31 DIAGNOSIS — Z01810 Encounter for preprocedural cardiovascular examination: Secondary | ICD-10-CM | POA: Diagnosis not present

## 2018-02-06 ENCOUNTER — Other Ambulatory Visit: Payer: Self-pay | Admitting: Internal Medicine

## 2018-02-06 DIAGNOSIS — R9431 Abnormal electrocardiogram [ECG] [EKG]: Secondary | ICD-10-CM | POA: Diagnosis not present

## 2018-02-06 DIAGNOSIS — I495 Sick sinus syndrome: Secondary | ICD-10-CM | POA: Diagnosis not present

## 2018-02-06 DIAGNOSIS — Z87891 Personal history of nicotine dependence: Secondary | ICD-10-CM | POA: Diagnosis not present

## 2018-02-11 DIAGNOSIS — Z5112 Encounter for antineoplastic immunotherapy: Secondary | ICD-10-CM | POA: Diagnosis not present

## 2018-02-11 DIAGNOSIS — C50919 Malignant neoplasm of unspecified site of unspecified female breast: Secondary | ICD-10-CM | POA: Diagnosis not present

## 2018-02-11 DIAGNOSIS — Z5111 Encounter for antineoplastic chemotherapy: Secondary | ICD-10-CM | POA: Diagnosis not present

## 2018-02-11 DIAGNOSIS — I272 Pulmonary hypertension, unspecified: Secondary | ICD-10-CM | POA: Diagnosis not present

## 2018-02-15 DIAGNOSIS — G4733 Obstructive sleep apnea (adult) (pediatric): Secondary | ICD-10-CM | POA: Diagnosis not present

## 2018-02-18 DIAGNOSIS — Z9889 Other specified postprocedural states: Secondary | ICD-10-CM | POA: Diagnosis not present

## 2018-02-18 DIAGNOSIS — Z170 Estrogen receptor positive status [ER+]: Secondary | ICD-10-CM | POA: Diagnosis not present

## 2018-02-18 DIAGNOSIS — C50919 Malignant neoplasm of unspecified site of unspecified female breast: Secondary | ICD-10-CM | POA: Diagnosis not present

## 2018-02-18 DIAGNOSIS — C50911 Malignant neoplasm of unspecified site of right female breast: Secondary | ICD-10-CM | POA: Diagnosis not present

## 2018-02-18 DIAGNOSIS — I890 Lymphedema, not elsewhere classified: Secondary | ICD-10-CM | POA: Diagnosis not present

## 2018-02-22 ENCOUNTER — Other Ambulatory Visit: Payer: Self-pay | Admitting: Internal Medicine

## 2018-02-22 MED ORDER — METOPROLOL TARTRATE 50 MG PO TABS: 25.0000 mg | ORAL_TABLET | Freq: Two times a day (BID) | ORAL | 0 refills | Status: AC

## 2018-02-22 NOTE — Telephone Encounter (Signed)
Requested medication (s) are due for refill today: yes  Requested medication (s) are on the active medication list: yes   Future visit scheduled: No  Notes to clinic:  Unable to refill per protocol per protocol due to out of range BP and HR on 12/10/17 visit.     Requested Prescriptions  Pending Prescriptions Disp Refills   metoprolol tartrate (LOPRESSOR) 50 MG tablet 90 tablet 1    Sig: Take 0.5 tablets (25 mg total) by mouth 2 (two) times daily.     Cardiovascular:  Beta Blockers Failed - 02/22/2018 12:51 PM      Failed - Last BP in normal range    BP Readings from Last 1 Encounters:  12/10/17 (!) 88/68         Failed - Last Heart Rate in normal range    Pulse Readings from Last 1 Encounters:  12/10/17 (!) 36         Passed - Valid encounter within last 6 months    Recent Outpatient Visits          8 months ago Essential hypertension   Archivist at [place], MD   1 year ago Essential hypertension   Archivist at [place], MD   1 year ago Injury of left hand, initial Loss adjuster, chartered at [place], MD   1 year ago Acute low back pain with sciatica, sciatica laterality unspecified, unspecified back pain laterality   Archivist at [place], MD   1 year ago Viral syndrome   Archivist at [place], MD      Future Appointments            In 6 months Vevelyn Royals, Parthenia Ames, [place] at [facility], [place]

## 2018-02-22 NOTE — Telephone Encounter (Signed)
Copied from [place] [id number]. Topic: Quick Communication - Rx Refill/Question >> Feb 22, 2018 12:40 PM Windy Kalata wrote: Medication: metoprolol tartrate (LOPRESSOR) 50 MG tablet     Has the patient contacted their pharmacy? Yes.   (Agent: If no, request that the patient contact the pharmacy for the refill.) (Agent: If yes, when and what did the pharmacy advise?)  Preferred Pharmacy (with phone number or street name): [id number] - [place], [place] [phone] (Phone) [phone] (Fax)    Agent: Please be advised that RX refills may take up to 3 business days. We ask that you follow-up with your pharmacy.

## 2018-03-04 DIAGNOSIS — I890 Lymphedema, not elsewhere classified: Secondary | ICD-10-CM | POA: Diagnosis not present

## 2018-03-05 DIAGNOSIS — I272 Pulmonary hypertension, unspecified: Secondary | ICD-10-CM | POA: Diagnosis not present

## 2018-03-05 DIAGNOSIS — Z5112 Encounter for antineoplastic immunotherapy: Secondary | ICD-10-CM | POA: Diagnosis not present

## 2018-03-05 DIAGNOSIS — C50919 Malignant neoplasm of unspecified site of unspecified female breast: Secondary | ICD-10-CM | POA: Diagnosis not present

## 2018-03-05 DIAGNOSIS — Z5111 Encounter for antineoplastic chemotherapy: Secondary | ICD-10-CM | POA: Diagnosis not present

## 2018-03-08 DIAGNOSIS — I890 Lymphedema, not elsewhere classified: Secondary | ICD-10-CM | POA: Diagnosis not present

## 2018-03-12 DIAGNOSIS — I10 Essential (primary) hypertension: Secondary | ICD-10-CM | POA: Diagnosis not present

## 2018-03-12 DIAGNOSIS — E119 Type 2 diabetes mellitus without complications: Secondary | ICD-10-CM | POA: Diagnosis not present

## 2018-03-12 DIAGNOSIS — E782 Mixed hyperlipidemia: Secondary | ICD-10-CM | POA: Diagnosis not present

## 2018-03-12 DIAGNOSIS — Z853 Personal history of malignant neoplasm of breast: Secondary | ICD-10-CM | POA: Diagnosis not present

## 2018-03-13 DIAGNOSIS — E039 Hypothyroidism, unspecified: Secondary | ICD-10-CM | POA: Diagnosis not present

## 2018-03-13 DIAGNOSIS — I10 Essential (primary) hypertension: Secondary | ICD-10-CM | POA: Diagnosis not present

## 2018-03-13 DIAGNOSIS — E782 Mixed hyperlipidemia: Secondary | ICD-10-CM | POA: Diagnosis not present

## 2018-03-13 DIAGNOSIS — E119 Type 2 diabetes mellitus without complications: Secondary | ICD-10-CM | POA: Diagnosis not present

## 2018-03-15 DIAGNOSIS — I890 Lymphedema, not elsewhere classified: Secondary | ICD-10-CM | POA: Diagnosis not present

## 2018-03-19 DIAGNOSIS — C50311 Malignant neoplasm of lower-inner quadrant of right female breast: Secondary | ICD-10-CM | POA: Diagnosis not present

## 2018-03-19 DIAGNOSIS — C50312 Malignant neoplasm of lower-inner quadrant of left female breast: Secondary | ICD-10-CM | POA: Diagnosis not present

## 2018-03-19 DIAGNOSIS — I10 Essential (primary) hypertension: Secondary | ICD-10-CM | POA: Diagnosis not present

## 2018-03-19 DIAGNOSIS — E1169 Type 2 diabetes mellitus with other specified complication: Secondary | ICD-10-CM | POA: Diagnosis not present

## 2018-03-19 DIAGNOSIS — Z87891 Personal history of nicotine dependence: Secondary | ICD-10-CM | POA: Diagnosis not present

## 2018-03-19 DIAGNOSIS — G4733 Obstructive sleep apnea (adult) (pediatric): Secondary | ICD-10-CM | POA: Diagnosis not present

## 2018-03-22 DIAGNOSIS — I890 Lymphedema, not elsewhere classified: Secondary | ICD-10-CM | POA: Diagnosis not present

## 2018-03-25 DIAGNOSIS — C50311 Malignant neoplasm of lower-inner quadrant of right female breast: Secondary | ICD-10-CM | POA: Diagnosis not present

## 2018-03-26 DIAGNOSIS — E1122 Type 2 diabetes mellitus with diabetic chronic kidney disease: Secondary | ICD-10-CM | POA: Diagnosis not present

## 2018-03-26 DIAGNOSIS — Z9221 Personal history of antineoplastic chemotherapy: Secondary | ICD-10-CM | POA: Diagnosis not present

## 2018-03-26 DIAGNOSIS — L818 Other specified disorders of pigmentation: Secondary | ICD-10-CM | POA: Diagnosis not present

## 2018-03-26 DIAGNOSIS — M25611 Stiffness of right shoulder, not elsewhere classified: Secondary | ICD-10-CM | POA: Diagnosis not present

## 2018-03-26 DIAGNOSIS — N183 Chronic kidney disease, stage 3 (moderate): Secondary | ICD-10-CM | POA: Diagnosis not present

## 2018-03-26 DIAGNOSIS — Z171 Estrogen receptor negative status [ER-]: Secondary | ICD-10-CM | POA: Diagnosis not present

## 2018-03-26 DIAGNOSIS — C50811 Malignant neoplasm of overlapping sites of right female breast: Secondary | ICD-10-CM | POA: Diagnosis not present

## 2018-03-26 DIAGNOSIS — Z923 Personal history of irradiation: Secondary | ICD-10-CM | POA: Diagnosis not present

## 2018-03-26 DIAGNOSIS — C50311 Malignant neoplasm of lower-inner quadrant of right female breast: Secondary | ICD-10-CM | POA: Diagnosis not present

## 2018-03-26 DIAGNOSIS — I890 Lymphedema, not elsewhere classified: Secondary | ICD-10-CM | POA: Diagnosis not present

## 2018-03-26 DIAGNOSIS — L819 Disorder of pigmentation, unspecified: Secondary | ICD-10-CM | POA: Diagnosis not present

## 2018-03-26 DIAGNOSIS — I272 Pulmonary hypertension, unspecified: Secondary | ICD-10-CM | POA: Diagnosis not present

## 2018-03-26 DIAGNOSIS — I129 Hypertensive chronic kidney disease with stage 1 through stage 4 chronic kidney disease, or unspecified chronic kidney disease: Secondary | ICD-10-CM | POA: Diagnosis not present

## 2018-03-29 DIAGNOSIS — C50311 Malignant neoplasm of lower-inner quadrant of right female breast: Secondary | ICD-10-CM | POA: Diagnosis not present

## 2018-04-01 DIAGNOSIS — C50311 Malignant neoplasm of lower-inner quadrant of right female breast: Secondary | ICD-10-CM | POA: Diagnosis not present

## 2018-04-02 ENCOUNTER — Other Ambulatory Visit: Payer: Self-pay | Admitting: Internal Medicine

## 2018-04-04 DIAGNOSIS — C50311 Malignant neoplasm of lower-inner quadrant of right female breast: Secondary | ICD-10-CM | POA: Diagnosis not present

## 2018-04-05 ENCOUNTER — Telehealth: Payer: Self-pay | Admitting: Internal Medicine

## 2018-04-05 DIAGNOSIS — C50311 Malignant neoplasm of lower-inner quadrant of right female breast: Secondary | ICD-10-CM | POA: Diagnosis not present

## 2018-04-05 NOTE — Telephone Encounter (Signed)
30 day supply of gabapentin sent to pharmacy. Pt was due for follow up in October and is past due.  Mychart message sent to pt call and schedule OV before further refills are due.

## 2018-04-08 DIAGNOSIS — C50311 Malignant neoplasm of lower-inner quadrant of right female breast: Secondary | ICD-10-CM | POA: Diagnosis not present

## 2018-04-09 DIAGNOSIS — C50311 Malignant neoplasm of lower-inner quadrant of right female breast: Secondary | ICD-10-CM | POA: Diagnosis not present

## 2018-04-10 DIAGNOSIS — C50311 Malignant neoplasm of lower-inner quadrant of right female breast: Secondary | ICD-10-CM | POA: Diagnosis not present

## 2018-04-11 DIAGNOSIS — C50311 Malignant neoplasm of lower-inner quadrant of right female breast: Secondary | ICD-10-CM | POA: Diagnosis not present

## 2018-04-12 DIAGNOSIS — I890 Lymphedema, not elsewhere classified: Secondary | ICD-10-CM | POA: Diagnosis not present

## 2018-04-12 DIAGNOSIS — C50311 Malignant neoplasm of lower-inner quadrant of right female breast: Secondary | ICD-10-CM | POA: Diagnosis not present

## 2018-04-15 DIAGNOSIS — C50311 Malignant neoplasm of lower-inner quadrant of right female breast: Secondary | ICD-10-CM | POA: Diagnosis not present

## 2018-04-15 DIAGNOSIS — I890 Lymphedema, not elsewhere classified: Secondary | ICD-10-CM | POA: Diagnosis not present

## 2018-04-16 DIAGNOSIS — C50311 Malignant neoplasm of lower-inner quadrant of right female breast: Secondary | ICD-10-CM | POA: Diagnosis not present

## 2018-04-17 DIAGNOSIS — I890 Lymphedema, not elsewhere classified: Secondary | ICD-10-CM | POA: Diagnosis not present

## 2018-04-17 DIAGNOSIS — C50311 Malignant neoplasm of lower-inner quadrant of right female breast: Secondary | ICD-10-CM | POA: Diagnosis not present

## 2018-04-18 DIAGNOSIS — C50311 Malignant neoplasm of lower-inner quadrant of right female breast: Secondary | ICD-10-CM | POA: Diagnosis not present

## 2018-04-19 DIAGNOSIS — C50311 Malignant neoplasm of lower-inner quadrant of right female breast: Secondary | ICD-10-CM | POA: Diagnosis not present

## 2018-04-22 DIAGNOSIS — I890 Lymphedema, not elsewhere classified: Secondary | ICD-10-CM | POA: Diagnosis not present

## 2018-04-22 DIAGNOSIS — C50311 Malignant neoplasm of lower-inner quadrant of right female breast: Secondary | ICD-10-CM | POA: Diagnosis not present

## 2018-04-22 NOTE — Telephone Encounter (Signed)
Received response that 2/7 mychart message had not been read yet. Mailed letter to pt.

## 2018-04-23 DIAGNOSIS — I890 Lymphedema, not elsewhere classified: Secondary | ICD-10-CM | POA: Diagnosis not present

## 2018-04-23 DIAGNOSIS — C50311 Malignant neoplasm of lower-inner quadrant of right female breast: Secondary | ICD-10-CM | POA: Diagnosis not present

## 2018-04-24 DIAGNOSIS — C50311 Malignant neoplasm of lower-inner quadrant of right female breast: Secondary | ICD-10-CM | POA: Diagnosis not present

## 2018-04-24 DIAGNOSIS — I890 Lymphedema, not elsewhere classified: Secondary | ICD-10-CM | POA: Diagnosis not present

## 2018-04-25 DIAGNOSIS — Z803 Family history of malignant neoplasm of breast: Secondary | ICD-10-CM | POA: Diagnosis not present

## 2018-04-25 DIAGNOSIS — Z1509 Genetic susceptibility to other malignant neoplasm: Secondary | ICD-10-CM | POA: Diagnosis not present

## 2018-04-25 DIAGNOSIS — Z808 Family history of malignant neoplasm of other organs or systems: Secondary | ICD-10-CM | POA: Diagnosis not present

## 2018-04-25 DIAGNOSIS — C50919 Malignant neoplasm of unspecified site of unspecified female breast: Secondary | ICD-10-CM | POA: Diagnosis not present

## 2018-04-25 DIAGNOSIS — C50311 Malignant neoplasm of lower-inner quadrant of right female breast: Secondary | ICD-10-CM | POA: Diagnosis not present

## 2018-04-26 DIAGNOSIS — I890 Lymphedema, not elsewhere classified: Secondary | ICD-10-CM | POA: Diagnosis not present

## 2018-04-26 DIAGNOSIS — C50311 Malignant neoplasm of lower-inner quadrant of right female breast: Secondary | ICD-10-CM | POA: Diagnosis not present

## 2018-04-29 DIAGNOSIS — C50311 Malignant neoplasm of lower-inner quadrant of right female breast: Secondary | ICD-10-CM | POA: Diagnosis not present

## 2018-04-30 DIAGNOSIS — C50311 Malignant neoplasm of lower-inner quadrant of right female breast: Secondary | ICD-10-CM | POA: Diagnosis not present

## 2018-05-01 ENCOUNTER — Other Ambulatory Visit: Payer: Self-pay | Admitting: Internal Medicine

## 2018-05-01 DIAGNOSIS — C50311 Malignant neoplasm of lower-inner quadrant of right female breast: Secondary | ICD-10-CM | POA: Diagnosis not present

## 2018-05-02 NOTE — Telephone Encounter (Signed)
Pt called after getting letter.  She is currently in [place] getting breast cancer treatments.  Pt is there because that is where her sister lives and she can be taken care of there.  She does have someone acting as a PCP while she is down there but doesn't want to loose Dr. Larose Kells as PCP here.  She wanted to explain why she hasn't been in for an OV.

## 2018-05-06 ENCOUNTER — Other Ambulatory Visit: Payer: Self-pay | Admitting: Internal Medicine

## 2018-05-13 ENCOUNTER — Other Ambulatory Visit: Payer: Self-pay | Admitting: Internal Medicine

## 2018-05-23 DIAGNOSIS — I890 Lymphedema, not elsewhere classified: Secondary | ICD-10-CM | POA: Diagnosis not present

## 2018-06-17 DIAGNOSIS — Z9221 Personal history of antineoplastic chemotherapy: Secondary | ICD-10-CM | POA: Diagnosis not present

## 2018-06-17 DIAGNOSIS — E119 Type 2 diabetes mellitus without complications: Secondary | ICD-10-CM | POA: Diagnosis not present

## 2018-06-17 DIAGNOSIS — I10 Essential (primary) hypertension: Secondary | ICD-10-CM | POA: Diagnosis not present

## 2018-06-17 DIAGNOSIS — R710 Precipitous drop in hematocrit: Secondary | ICD-10-CM | POA: Diagnosis not present

## 2018-06-17 DIAGNOSIS — Z853 Personal history of malignant neoplasm of breast: Secondary | ICD-10-CM | POA: Diagnosis not present

## 2018-06-25 DIAGNOSIS — R197 Diarrhea, unspecified: Secondary | ICD-10-CM | POA: Diagnosis not present

## 2018-06-25 DIAGNOSIS — H53469 Homonymous bilateral field defects, unspecified side: Secondary | ICD-10-CM | POA: Diagnosis not present

## 2018-06-25 DIAGNOSIS — N183 Chronic kidney disease, stage 3 (moderate): Secondary | ICD-10-CM | POA: Diagnosis not present

## 2018-06-25 DIAGNOSIS — I129 Hypertensive chronic kidney disease with stage 1 through stage 4 chronic kidney disease, or unspecified chronic kidney disease: Secondary | ICD-10-CM | POA: Diagnosis not present

## 2018-06-25 DIAGNOSIS — N6459 Other signs and symptoms in breast: Secondary | ICD-10-CM | POA: Diagnosis not present

## 2018-06-25 DIAGNOSIS — Z9221 Personal history of antineoplastic chemotherapy: Secondary | ICD-10-CM | POA: Diagnosis not present

## 2018-06-25 DIAGNOSIS — Z87898 Personal history of other specified conditions: Secondary | ICD-10-CM | POA: Diagnosis not present

## 2018-06-25 DIAGNOSIS — I890 Lymphedema, not elsewhere classified: Secondary | ICD-10-CM | POA: Diagnosis not present

## 2018-06-25 DIAGNOSIS — I272 Pulmonary hypertension, unspecified: Secondary | ICD-10-CM | POA: Diagnosis not present

## 2018-06-25 DIAGNOSIS — C50911 Malignant neoplasm of unspecified site of right female breast: Secondary | ICD-10-CM | POA: Diagnosis not present

## 2018-06-25 DIAGNOSIS — Z7984 Long term (current) use of oral hypoglycemic drugs: Secondary | ICD-10-CM | POA: Diagnosis not present

## 2018-06-25 DIAGNOSIS — E1122 Type 2 diabetes mellitus with diabetic chronic kidney disease: Secondary | ICD-10-CM | POA: Diagnosis not present

## 2018-06-25 DIAGNOSIS — C50311 Malignant neoplasm of lower-inner quadrant of right female breast: Secondary | ICD-10-CM | POA: Diagnosis not present

## 2018-06-27 DIAGNOSIS — R197 Diarrhea, unspecified: Secondary | ICD-10-CM | POA: Diagnosis not present

## 2018-06-27 DIAGNOSIS — R69 Illness, unspecified: Secondary | ICD-10-CM | POA: Diagnosis not present

## 2018-06-27 DIAGNOSIS — E119 Type 2 diabetes mellitus without complications: Secondary | ICD-10-CM | POA: Diagnosis not present

## 2018-06-30 ENCOUNTER — Other Ambulatory Visit: Payer: Self-pay | Admitting: Internal Medicine

## 2018-07-26 DIAGNOSIS — E119 Type 2 diabetes mellitus without complications: Secondary | ICD-10-CM | POA: Diagnosis not present

## 2018-07-26 DIAGNOSIS — I10 Essential (primary) hypertension: Secondary | ICD-10-CM | POA: Diagnosis not present

## 2018-07-26 DIAGNOSIS — I495 Sick sinus syndrome: Secondary | ICD-10-CM | POA: Diagnosis not present

## 2018-07-26 DIAGNOSIS — G4733 Obstructive sleep apnea (adult) (pediatric): Secondary | ICD-10-CM | POA: Diagnosis not present

## 2018-07-26 DIAGNOSIS — Z6841 Body Mass Index (BMI) 40.0 and over, adult: Secondary | ICD-10-CM | POA: Diagnosis not present

## 2018-07-26 DIAGNOSIS — I480 Paroxysmal atrial fibrillation: Secondary | ICD-10-CM | POA: Diagnosis not present

## 2018-07-29 DIAGNOSIS — I890 Lymphedema, not elsewhere classified: Secondary | ICD-10-CM | POA: Diagnosis not present

## 2018-07-31 ENCOUNTER — Other Ambulatory Visit: Payer: Self-pay | Admitting: Internal Medicine

## 2018-08-07 DIAGNOSIS — I4891 Unspecified atrial fibrillation: Secondary | ICD-10-CM | POA: Diagnosis not present

## 2018-08-07 DIAGNOSIS — I517 Cardiomegaly: Secondary | ICD-10-CM | POA: Diagnosis not present

## 2018-08-07 DIAGNOSIS — I361 Nonrheumatic tricuspid (valve) insufficiency: Secondary | ICD-10-CM | POA: Diagnosis not present

## 2018-08-12 ENCOUNTER — Other Ambulatory Visit: Payer: Self-pay | Admitting: Internal Medicine

## 2018-08-14 DIAGNOSIS — G4733 Obstructive sleep apnea (adult) (pediatric): Secondary | ICD-10-CM | POA: Diagnosis not present

## 2018-08-14 DIAGNOSIS — I129 Hypertensive chronic kidney disease with stage 1 through stage 4 chronic kidney disease, or unspecified chronic kidney disease: Secondary | ICD-10-CM | POA: Diagnosis not present

## 2018-08-14 DIAGNOSIS — I10 Essential (primary) hypertension: Secondary | ICD-10-CM | POA: Diagnosis not present

## 2018-08-14 DIAGNOSIS — I517 Cardiomegaly: Secondary | ICD-10-CM | POA: Diagnosis not present

## 2018-08-14 DIAGNOSIS — I480 Paroxysmal atrial fibrillation: Secondary | ICD-10-CM | POA: Diagnosis not present

## 2018-08-14 DIAGNOSIS — I4819 Other persistent atrial fibrillation: Secondary | ICD-10-CM | POA: Diagnosis not present

## 2018-08-14 DIAGNOSIS — E1122 Type 2 diabetes mellitus with diabetic chronic kidney disease: Secondary | ICD-10-CM | POA: Diagnosis not present

## 2018-08-14 DIAGNOSIS — I4891 Unspecified atrial fibrillation: Secondary | ICD-10-CM | POA: Diagnosis not present

## 2018-08-14 DIAGNOSIS — N183 Chronic kidney disease, stage 3 (moderate): Secondary | ICD-10-CM | POA: Diagnosis not present

## 2018-08-14 DIAGNOSIS — R001 Bradycardia, unspecified: Secondary | ICD-10-CM | POA: Diagnosis not present

## 2018-08-19 DIAGNOSIS — G4733 Obstructive sleep apnea (adult) (pediatric): Secondary | ICD-10-CM | POA: Diagnosis not present

## 2018-08-29 ENCOUNTER — Ambulatory Visit: Payer: Medicare HMO | Admitting: *Deleted

## 2018-08-29 ENCOUNTER — Other Ambulatory Visit: Payer: Self-pay | Admitting: Internal Medicine

## 2018-09-09 ENCOUNTER — Telehealth: Payer: Self-pay | Admitting: Oncology

## 2018-09-09 ENCOUNTER — Inpatient Hospital Stay: Payer: Medicare HMO | Admitting: Oncology

## 2018-09-09 NOTE — Telephone Encounter (Signed)
Returned call re message to cancel 7/13 f/u. Left message confirming cancellation and asked patient to call re rescheduling.

## 2018-09-27 ENCOUNTER — Encounter: Payer: Self-pay | Admitting: Internal Medicine

## 2018-10-01 ENCOUNTER — Telehealth: Payer: Self-pay | Admitting: Oncology

## 2018-10-01 NOTE — Telephone Encounter (Signed)
Called per 8/4 sch message - to reschedule - no answer - left message for patient to call back to reschedule.

## 2018-10-17 ENCOUNTER — Telehealth: Payer: Self-pay | Admitting: *Deleted

## 2018-10-17 NOTE — Telephone Encounter (Signed)
Left message for a return phone call about scheduling an appointment for a follow up with Dr. Jana Hakim.

## 2019-09-26 IMAGING — MG MM PLC BREAST LOC DEV 1ST LESION INC*R*
6 series · 6 of 6 positions shown · non-contrast
Comparison: Previous exam(s).

CLINICAL DATA: Patient presents for radioactive seed localization
of biopsy-proven malignancy over the inner lower right breast marked
by ribbon clip.

EXAM:
MAMMOGRAPHIC GUIDED RADIOACTIVE SEED LOCALIZATION OF THE RIGHT
BREAST

[R CC (1 of 3)]
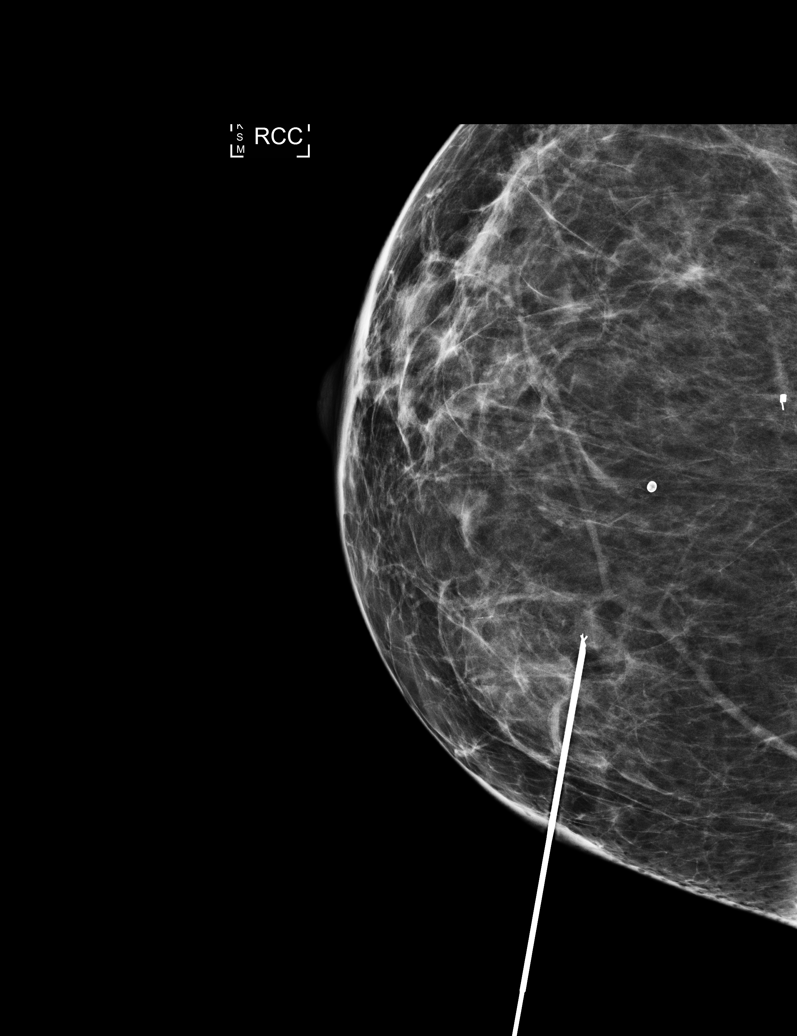

[R CC (2 of 3)]
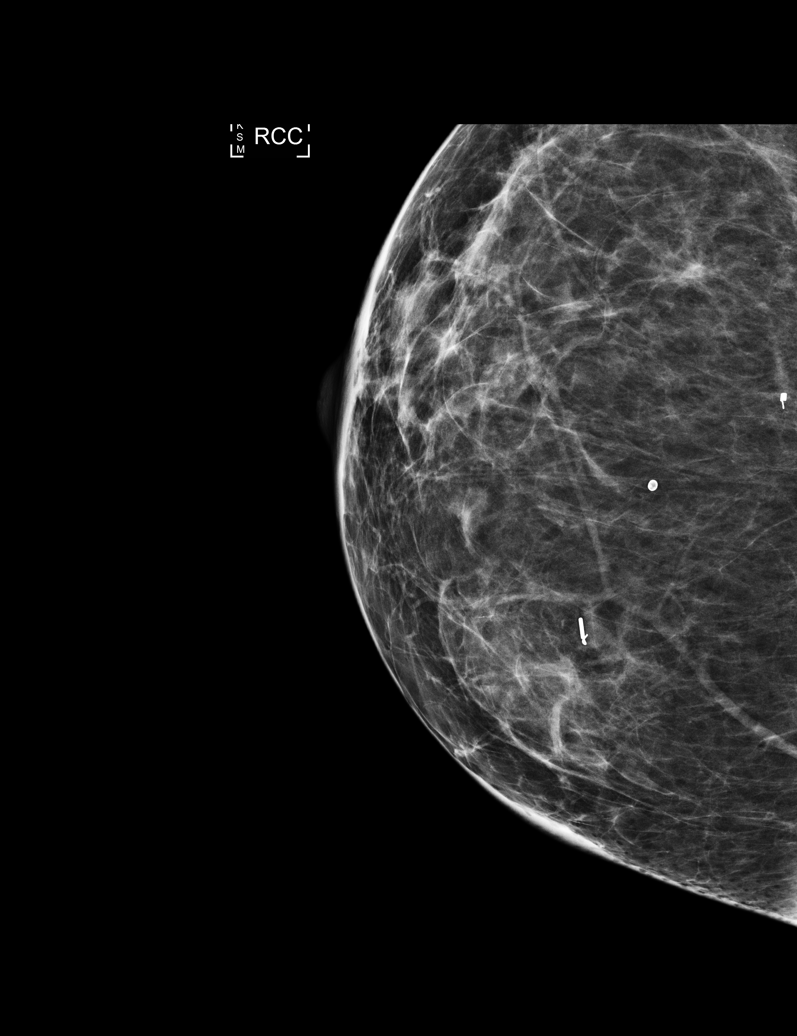

[R ML (1 of 3)]
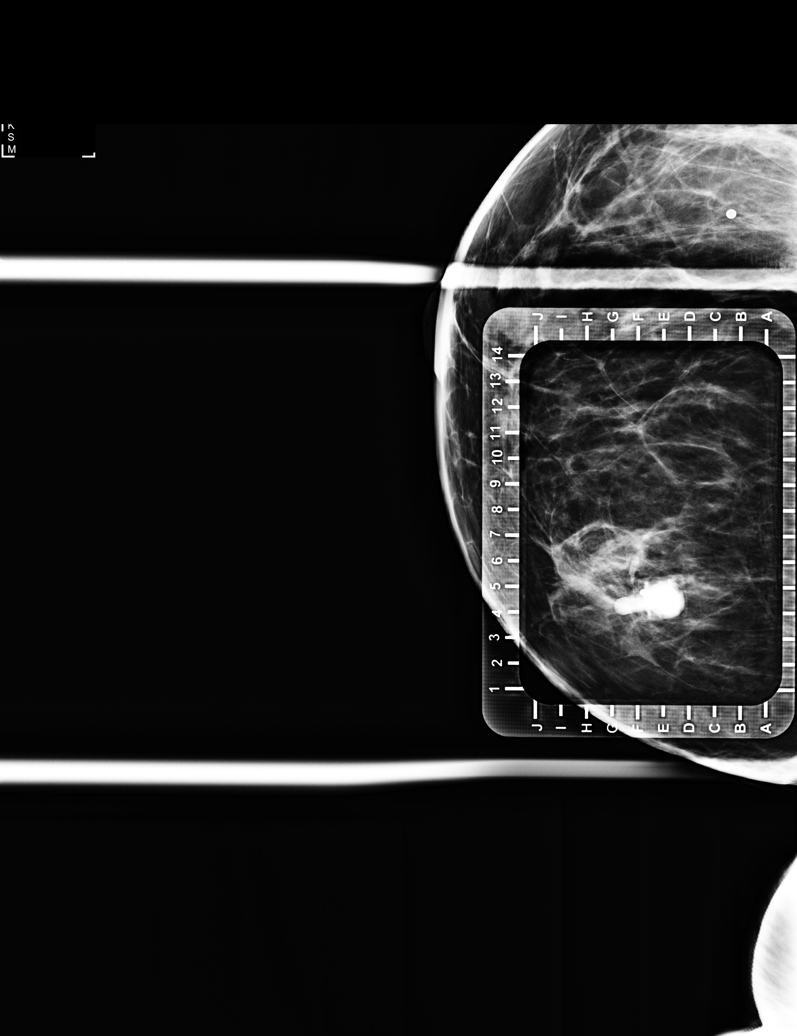

[R CC (3 of 3)]
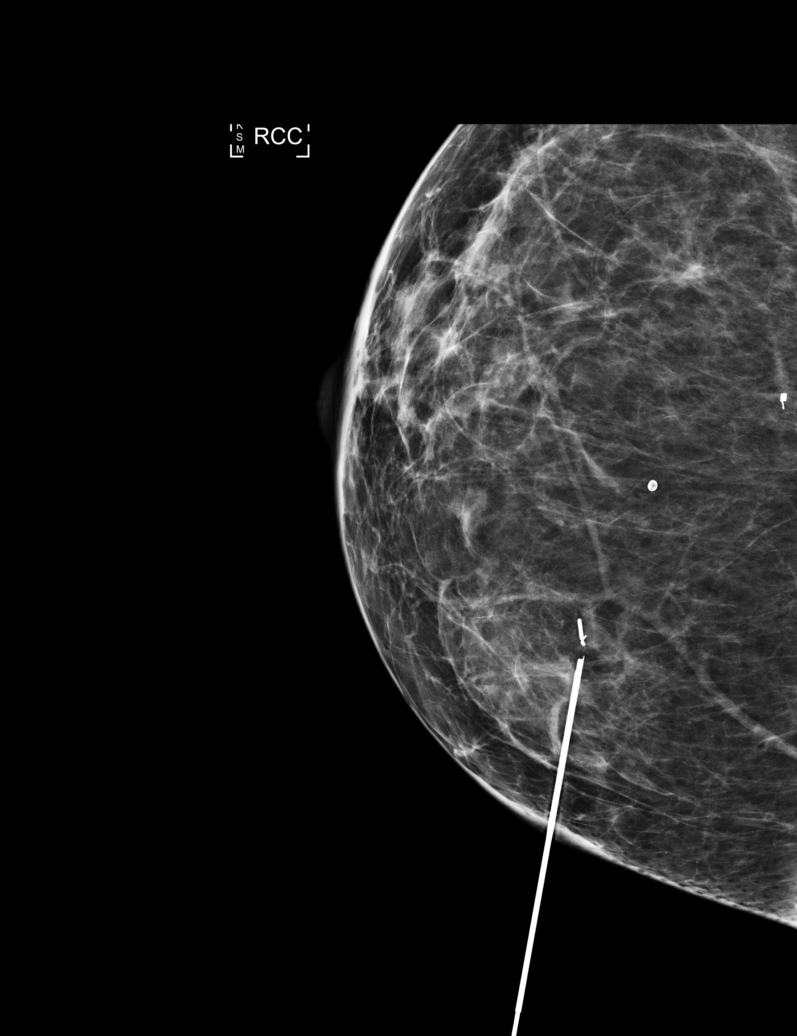

[R ML (2 of 3)]
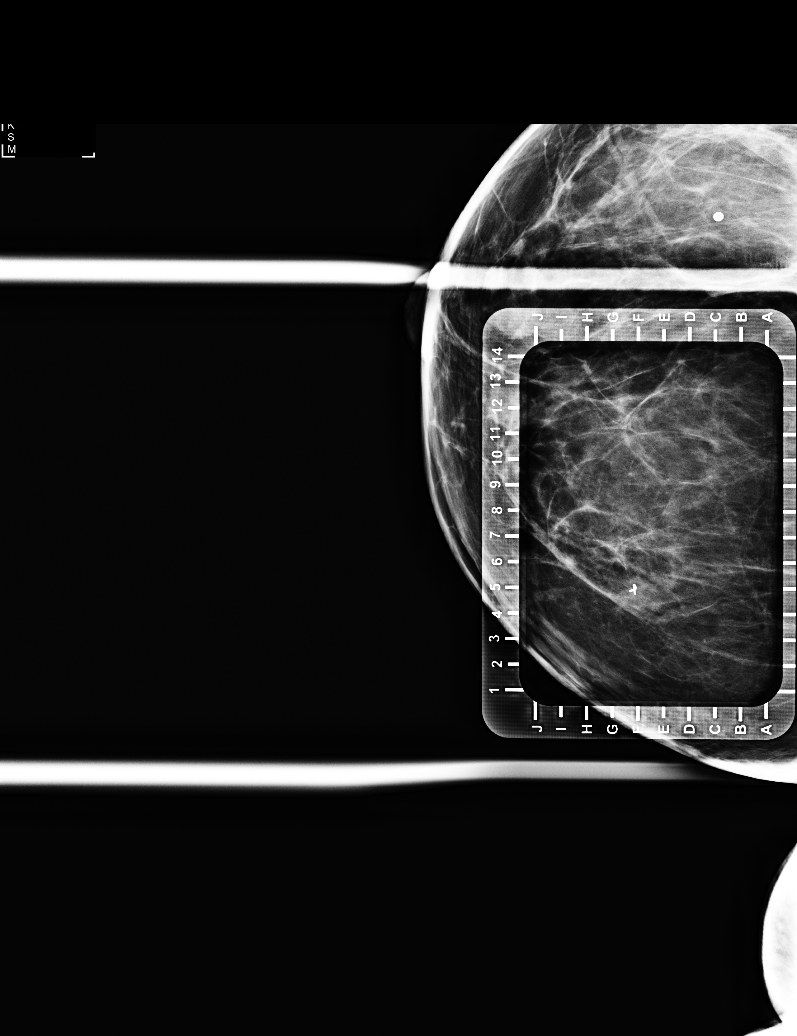

[R ML (3 of 3)]
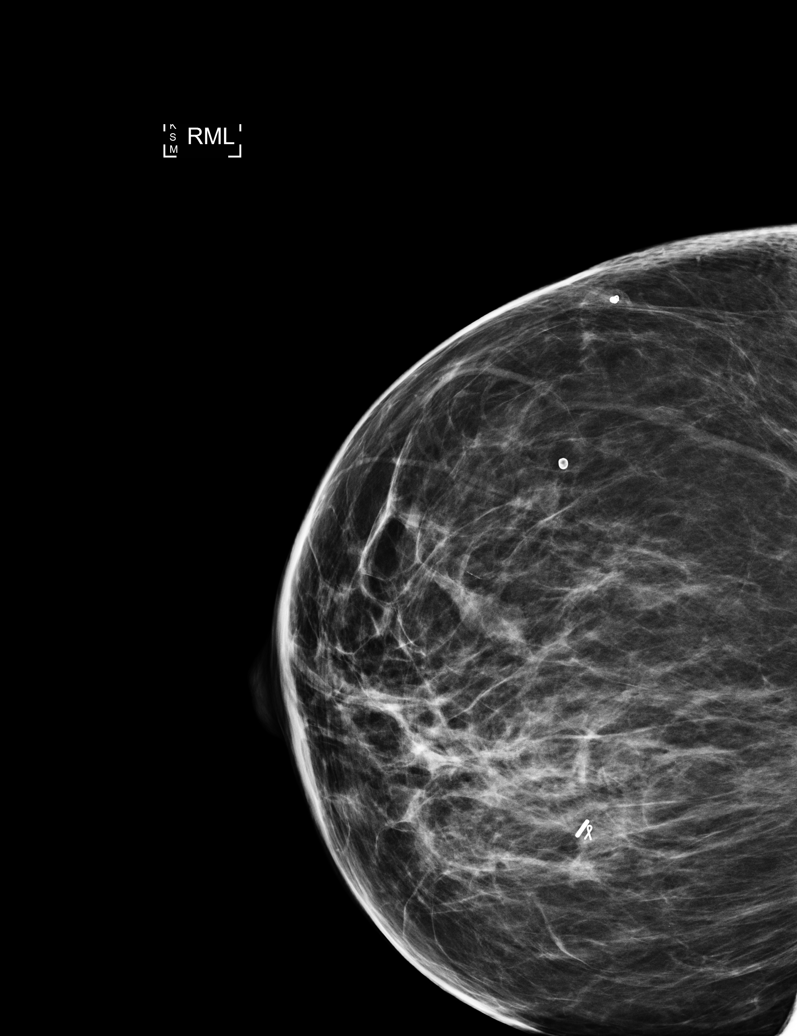

[6 of 6 positions shown; findings below may reference images not displayed]

FINDINGS: Patient presents for radioactive seed localization prior to surgical
excision. I met with the patient and we discussed the procedure of
seed localization including benefits and alternatives. We discussed
the high likelihood of a successful procedure. We discussed the
risks of the procedure including infection, bleeding, tissue injury
and further surgery. We discussed the low dose of radioactivity
involved in the procedure. Informed, written consent was given.

The usual time-out protocol was performed immediately prior to the
procedure.

Using mammographic guidance, sterile technique, 1% lidocaine and an
2-Q65 radioactive seed, the ribbon clip was localized using a medial
to lateral approach. The radioactive seed lies directly at the
targeted ribbon shaped metallic clip. The follow-up mammogram images
confirm the seed in the expected location and were marked for Dr.
Bilgo.

Follow-up survey of the patient confirms presence of the radioactive
seed.

Order number of [id number] seed:  [id number].

Total activity:  0.252 millicurie reference Date: 11/20/2017

The patient tolerated the procedure well and was released from the
[REDACTED]. She was given instructions regarding seed removal.
IMPRESSION: Radioactive seed localization right breast. No apparent
complications.

## 2019-09-29 IMAGING — MG BREAST SURGICAL SPECIMEN
1 series · 1 of 1 positions shown · non-contrast
Comparison: Previous exam(s).

CLINICAL DATA: Status post lumpectomy today after earlier
radioactive seed localization.

EXAM:
SPECIMEN RADIOGRAPH OF THE RIGHT BREAST

[R]
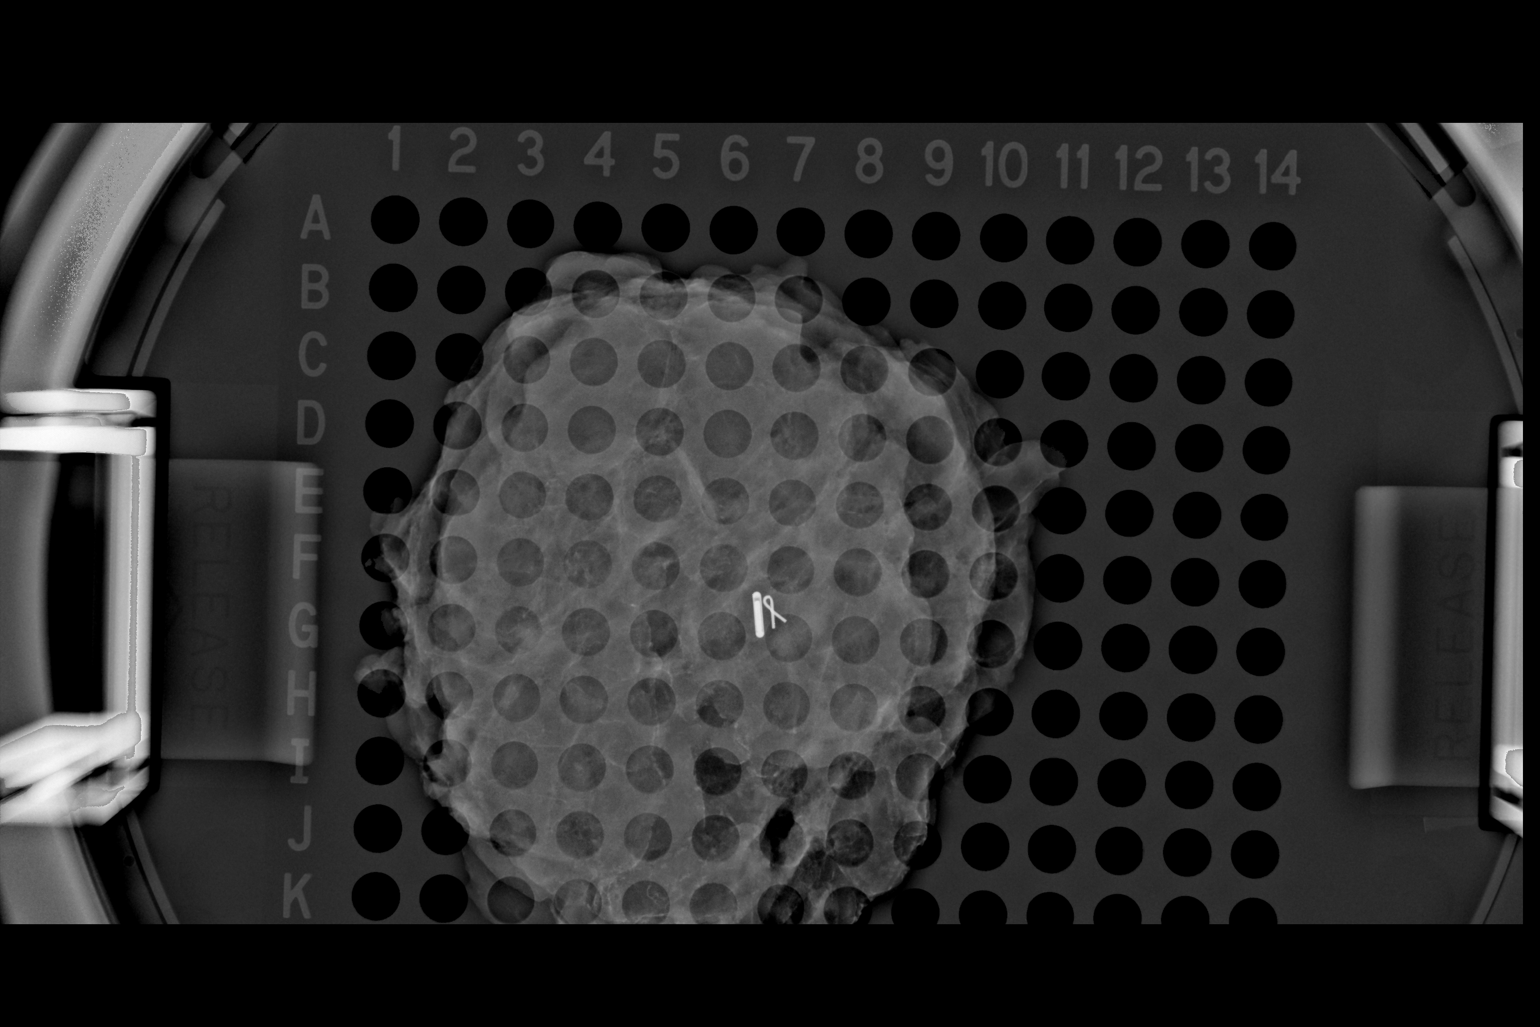

[1 of 1 positions shown; findings below may reference images not displayed]

FINDINGS: Status post excision of the right breast. The radioactive seed and
biopsy marker clip are present, completely intact, and were marked
for pathology. The locations of the radioactive seed and biopsy
marker clip within the specimen were discussed with the OR staff
during the procedure.
IMPRESSION: Specimen radiograph of the right breast.

## 2019-09-29 IMAGING — CR DG CHEST 1V PORT
1 series · 1 of 1 positions shown · non-contrast
Comparison: 09/26/2016

CLINICAL DATA: Porta catheter placement

EXAM:
PORTABLE CHEST 1 VIEW

[chest ap]
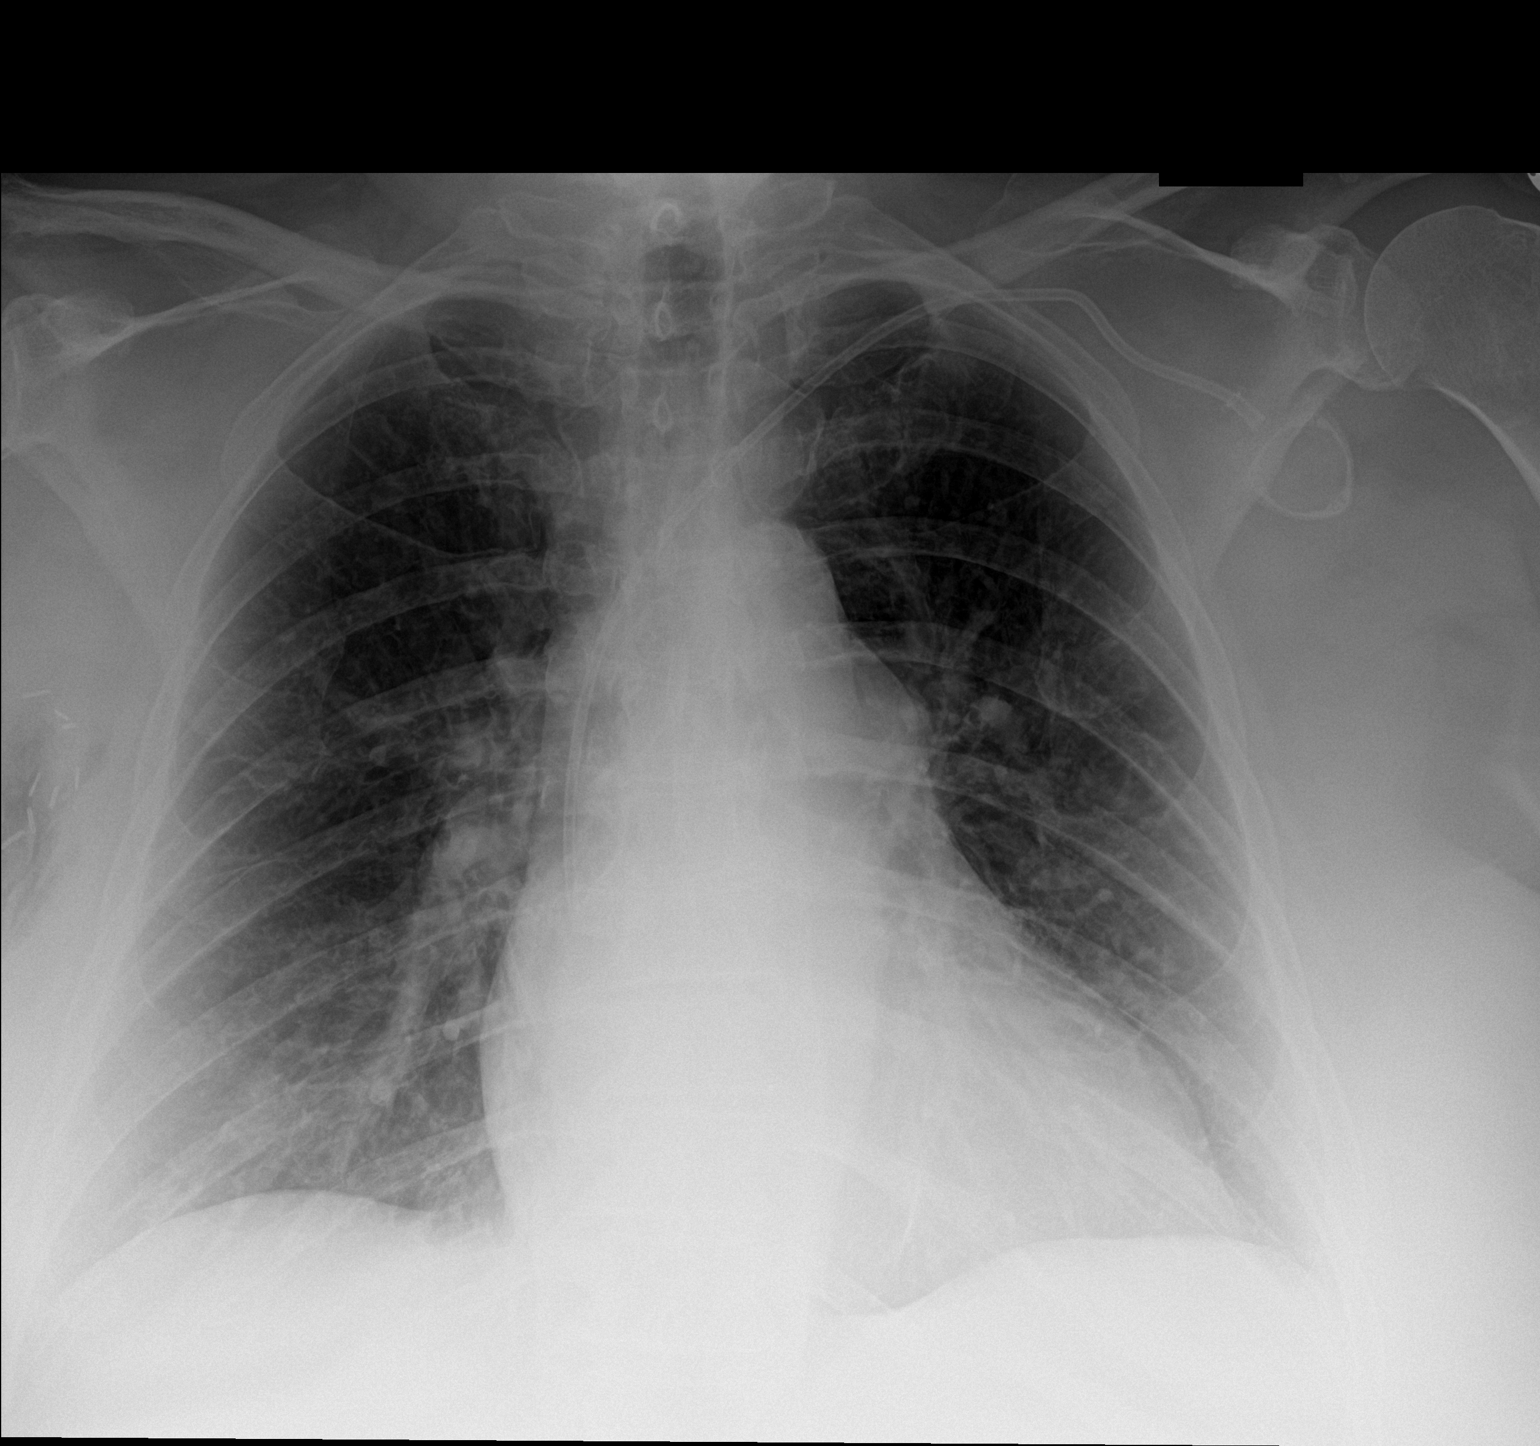

[1 of 1 positions shown; findings below may reference images not displayed]

FINDINGS: Subclavian porta catheter on the left with tip at the upper
cavoatrial junction. No evidence of pneumothorax. No vessel kinking.
No mediastinal widening or pleural fluid.

Borderline heart size, accentuated by technique. Prominent main
pulmonary artery contour. Mild coarsening of lung markings, likely
atelectasis.
IMPRESSION: Left-sided porta catheter without adverse feature.
